# Patient Record
Sex: Male | Born: 1970 | Race: White | Hispanic: No | Marital: Married | Smoking: Former smoker
Health system: Southern US, Community
[De-identification: ages and names within clinical notes are randomized; demographics above are authoritative.]

## PROBLEM LIST (undated history)

## (undated) DIAGNOSIS — F419 Anxiety disorder, unspecified: Secondary | ICD-10-CM

## (undated) DIAGNOSIS — E559 Vitamin D deficiency, unspecified: Secondary | ICD-10-CM

## (undated) DIAGNOSIS — Z0282 Encounter for adoption services: Secondary | ICD-10-CM

## (undated) DIAGNOSIS — N471 Phimosis: Secondary | ICD-10-CM

## (undated) DIAGNOSIS — Z62819 Personal history of unspecified abuse in childhood: Secondary | ICD-10-CM

## (undated) DIAGNOSIS — K921 Melena: Secondary | ICD-10-CM

## (undated) DIAGNOSIS — E291 Testicular hypofunction: Secondary | ICD-10-CM

## (undated) DIAGNOSIS — I2510 Atherosclerotic heart disease of native coronary artery without angina pectoris: Secondary | ICD-10-CM

## (undated) DIAGNOSIS — G43909 Migraine, unspecified, not intractable, without status migrainosus: Secondary | ICD-10-CM

## (undated) DIAGNOSIS — T1490XA Injury, unspecified, initial encounter: Secondary | ICD-10-CM

## (undated) DIAGNOSIS — F32A Depression, unspecified: Secondary | ICD-10-CM

## (undated) DIAGNOSIS — K219 Gastro-esophageal reflux disease without esophagitis: Secondary | ICD-10-CM

## (undated) DIAGNOSIS — Z789 Other specified health status: Secondary | ICD-10-CM

## (undated) DIAGNOSIS — F319 Bipolar disorder, unspecified: Secondary | ICD-10-CM

## (undated) DIAGNOSIS — B029 Zoster without complications: Secondary | ICD-10-CM

## (undated) DIAGNOSIS — F1210 Cannabis abuse, uncomplicated: Secondary | ICD-10-CM

## (undated) DIAGNOSIS — B019 Varicella without complication: Secondary | ICD-10-CM

## (undated) DIAGNOSIS — Z8709 Personal history of other diseases of the respiratory system: Secondary | ICD-10-CM

## (undated) DIAGNOSIS — R399 Unspecified symptoms and signs involving the genitourinary system: Secondary | ICD-10-CM

## (undated) DIAGNOSIS — N419 Inflammatory disease of prostate, unspecified: Secondary | ICD-10-CM

## (undated) DIAGNOSIS — M1990 Unspecified osteoarthritis, unspecified site: Secondary | ICD-10-CM

## (undated) HISTORY — DX: Varicella without complication: B01.9

## (undated) HISTORY — DX: Gastro-esophageal reflux disease without esophagitis: K21.9

## (undated) HISTORY — PX: HERNIA REPAIR: SHX51

## (undated) HISTORY — DX: Migraine, unspecified, not intractable, without status migrainosus: G43.909

## (undated) HISTORY — PX: SHOULDER ARTHROSCOPY W/ ROTATOR CUFF REPAIR: SHX2400

## (undated) HISTORY — DX: Melena: K92.1

---

## 2008-12-16 ENCOUNTER — Emergency Department: Payer: Self-pay | Admitting: Emergency Medicine

## 2008-12-21 ENCOUNTER — Emergency Department: Payer: Self-pay | Admitting: Emergency Medicine

## 2010-06-08 ENCOUNTER — Emergency Department (HOSPITAL_COMMUNITY)
Admission: EM | Admit: 2010-06-08 | Discharge: 2010-06-08 | Disposition: A | Discharge status: Home / Self Care | Payer: Self-pay | Attending: Emergency Medicine | Admitting: Emergency Medicine

## 2010-06-08 ENCOUNTER — Emergency Department (HOSPITAL_COMMUNITY): Payer: Self-pay

## 2010-06-08 DIAGNOSIS — M7989 Other specified soft tissue disorders: Secondary | ICD-10-CM | POA: Insufficient documentation

## 2010-06-08 DIAGNOSIS — M25569 Pain in unspecified knee: Secondary | ICD-10-CM | POA: Insufficient documentation

## 2010-06-08 DIAGNOSIS — R296 Repeated falls: Secondary | ICD-10-CM | POA: Insufficient documentation

## 2010-06-08 DIAGNOSIS — IMO0002 Reserved for concepts with insufficient information to code with codable children: Secondary | ICD-10-CM | POA: Insufficient documentation

## 2010-07-11 ENCOUNTER — Emergency Department: Payer: Self-pay | Admitting: Emergency Medicine

## 2012-10-23 ENCOUNTER — Emergency Department: Payer: Self-pay | Admitting: Emergency Medicine

## 2013-02-23 ENCOUNTER — Emergency Department: Payer: Self-pay | Admitting: Emergency Medicine

## 2013-02-25 ENCOUNTER — Emergency Department: Payer: Self-pay | Admitting: Emergency Medicine

## 2013-04-28 ENCOUNTER — Emergency Department (HOSPITAL_BASED_OUTPATIENT_CLINIC_OR_DEPARTMENT_OTHER)
Admission: EM | Admit: 2013-04-28 | Discharge: 2013-04-28 | Disposition: A | Discharge status: Home / Self Care | Payer: Worker's Compensation | Attending: Emergency Medicine | Admitting: Emergency Medicine

## 2013-04-28 ENCOUNTER — Emergency Department (HOSPITAL_BASED_OUTPATIENT_CLINIC_OR_DEPARTMENT_OTHER): Payer: Worker's Compensation

## 2013-04-28 ENCOUNTER — Encounter (HOSPITAL_BASED_OUTPATIENT_CLINIC_OR_DEPARTMENT_OTHER): Payer: Self-pay | Admitting: Emergency Medicine

## 2013-04-28 DIAGNOSIS — Y9229 Other specified public building as the place of occurrence of the external cause: Secondary | ICD-10-CM | POA: Insufficient documentation

## 2013-04-28 DIAGNOSIS — S93409A Sprain of unspecified ligament of unspecified ankle, initial encounter: Secondary | ICD-10-CM | POA: Insufficient documentation

## 2013-04-28 DIAGNOSIS — F17200 Nicotine dependence, unspecified, uncomplicated: Secondary | ICD-10-CM | POA: Insufficient documentation

## 2013-04-28 DIAGNOSIS — X500XXA Overexertion from strenuous movement or load, initial encounter: Secondary | ICD-10-CM | POA: Insufficient documentation

## 2013-04-28 DIAGNOSIS — S93401A Sprain of unspecified ligament of right ankle, initial encounter: Secondary | ICD-10-CM

## 2013-04-28 DIAGNOSIS — Y9389 Activity, other specified: Secondary | ICD-10-CM | POA: Insufficient documentation

## 2013-04-28 DIAGNOSIS — Z791 Long term (current) use of non-steroidal anti-inflammatories (NSAID): Secondary | ICD-10-CM | POA: Insufficient documentation

## 2013-04-28 MED ORDER — HYDROCODONE-ACETAMINOPHEN 5-325 MG PO TABS: 2.0000 | ORAL_TABLET | ORAL | Status: DC | PRN

## 2013-04-28 MED ORDER — OXYCODONE-ACETAMINOPHEN 5-325 MG PO TABS
2.0000 | ORAL_TABLET | Freq: Once | ORAL | Status: AC
  Administered 2013-04-28: 2 via ORAL
  Filled 2013-04-28: qty 2

## 2013-04-28 MED ORDER — IBUPROFEN 800 MG PO TABS: 800.0000 mg | ORAL_TABLET | Freq: Three times a day (TID) | ORAL | Status: DC

## 2013-04-28 NOTE — ED Notes (Signed)
Stepped pff platform and twisted rt foot  Heard pop

## 2013-04-28 NOTE — ED Notes (Signed)
Pt c/o right ankle injury x 1 hr ago  

## 2013-04-28 NOTE — ED Provider Notes (Signed)
CSN: [id number]     Arrival date & time 04/28/13  1925 History  This chart was scribed for Glynn OctaveStephen Araminta Zorn, MD by Leone PayorSonum Patel, ED Scribe. This patient was seen in room MH05/MH05 and the patient's care was started 8:49 PM.      Chief Complaint  Patient presents with  . Ankle Pain    The history is provided by the patient. No language interpreter was used.    HPI Comments: Carlos Tapia is a 43 y.o. male who presents to the Emergency Department complaining of a right ankle injury that occurred about 2 hours ago. Pt states he was standing on a platform when he twisted his ankle. He denies falling or any other injuries. Pt states he has injured the same ankle when he was a child and has had occasional pain since then. He denies taking OTC pain medication for his symptoms. He denies numbness or weakness.   History reviewed. No pertinent past medical history. History reviewed. No pertinent past surgical history. History reviewed. No pertinent family history. History  Substance Use Topics  . Smoking status: Current Every Day Smoker -- 1.00 packs/day    Types: Cigarettes  . Smokeless tobacco: Not on file  . Alcohol Use: No    Review of Systems A complete 10 system review of systems was obtained and all systems are negative except as noted in the HPI and PMH.   Allergies  Review of patient's allergies indicates no known allergies.  Home Medications   Current Outpatient Rx  Name  Route  Sig  Dispense  Refill  . HYDROcodone-acetaminophen (NORCO/VICODIN) 5-325 MG per tablet   Oral   Take 2 tablets by mouth every 4 (four) hours as needed.   10 tablet   0   . ibuprofen (ADVIL,MOTRIN) 800 MG tablet   Oral   Take 1 tablet (800 mg total) by mouth 3 (three) times daily.   21 tablet   0    BP 156/98  Pulse 80  Temp(Src) 98.6 F (37 C) (Oral)  Resp 16  Ht 5\' 10"  (1.778 m)  Wt 280 lb (127.007 kg)  BMI 40.18 kg/m2  SpO2 99% Physical Exam  Nursing note and vitals  reviewed. Constitutional: He is oriented to person, place, and time. He appears well-developed and well-nourished.  HENT:  Head: Normocephalic and atraumatic.  Cardiovascular: Normal rate.   Pulmonary/Chest: Effort normal.  Abdominal: He exhibits no distension.  Musculoskeletal: He exhibits tenderness.       Right ankle: He exhibits swelling. He exhibits no deformity. Tenderness. Lateral malleolus tenderness found. No proximal fibula tenderness found. Achilles tendon normal. Achilles tendon exhibits normal Thompson's test results.  Swelling over right lateral malleolus. Achilles tendon intact. No proximal fibular tenderness.  +2 DP and PT pulses  Neurological: He is alert and oriented to person, place, and time.  Skin: Skin is warm and dry.  Psychiatric: He has a normal mood and affect.    ED Course  Procedures (including critical care time)  DIAGNOSTIC STUDIES: Oxygen Saturation is 99% on RA, normal by my interpretation.    COORDINATION OF CARE: 8:52 PM Discussed treatment plan with pt at bedside and pt agreed to plan.   Labs Review Labs Reviewed - No data to display Imaging Review Dg Ankle Complete Right  04/28/2013   CLINICAL DATA:  Fall, ankle injury, popping sensation  EXAM: RIGHT ANKLE - COMPLETE 3+ VIEW  COMPARISON:  None.  FINDINGS: Normal alignment without definite acute fracture. Right distal tibia, fibula, talus  and calcaneus appear intact. No significant soft tissue swelling.  IMPRESSION: No acute osseous finding   Electronically Signed   By: Ruel Favors M.D.   On: 04/28/2013 19:53    EKG Interpretation   None       MDM   1. Right ankle sprain    Twisted right ankle at work. Denies any other injury. Denies falling.  Swelling over right lateral malleolus. Intact distal pulses. Achilles tendon intact. No proximal fibula tenderness.  X-ray negative for fracture. We'll treat for ankle sprain with ASO, crutches, ice, pain medication.  I personally performed  the services described in this documentation, which was scribed in my presence. The recorded information has been reviewed and is accurate.   Glynn Octave, MD 04/28/13 2116

## 2013-04-28 NOTE — Discharge Instructions (Signed)

## 2013-11-18 ENCOUNTER — Ambulatory Visit: Payer: Self-pay | Admitting: Internal Medicine

## 2014-03-12 ENCOUNTER — Encounter (HOSPITAL_COMMUNITY): Payer: Self-pay | Admitting: Emergency Medicine

## 2014-03-12 ENCOUNTER — Emergency Department (HOSPITAL_COMMUNITY): Payer: 59

## 2014-03-12 ENCOUNTER — Emergency Department (HOSPITAL_COMMUNITY)
Admission: EM | Admit: 2014-03-12 | Discharge: 2014-03-12 | Disposition: A | Discharge status: Home / Self Care | Payer: 59 | Attending: Emergency Medicine | Admitting: Emergency Medicine

## 2014-03-12 DIAGNOSIS — Z720 Tobacco use: Secondary | ICD-10-CM | POA: Diagnosis not present

## 2014-03-12 DIAGNOSIS — M79601 Pain in right arm: Secondary | ICD-10-CM

## 2014-03-12 DIAGNOSIS — Y9389 Activity, other specified: Secondary | ICD-10-CM | POA: Diagnosis not present

## 2014-03-12 DIAGNOSIS — W19XXXA Unspecified fall, initial encounter: Secondary | ICD-10-CM

## 2014-03-12 DIAGNOSIS — S6991XA Unspecified injury of right wrist, hand and finger(s), initial encounter: Secondary | ICD-10-CM | POA: Diagnosis not present

## 2014-03-12 DIAGNOSIS — Y998 Other external cause status: Secondary | ICD-10-CM | POA: Diagnosis not present

## 2014-03-12 DIAGNOSIS — W1839XA Other fall on same level, initial encounter: Secondary | ICD-10-CM | POA: Insufficient documentation

## 2014-03-12 DIAGNOSIS — T1490XA Injury, unspecified, initial encounter: Secondary | ICD-10-CM

## 2014-03-12 DIAGNOSIS — Y9289 Other specified places as the place of occurrence of the external cause: Secondary | ICD-10-CM | POA: Insufficient documentation

## 2014-03-12 DIAGNOSIS — S4991XA Unspecified injury of right shoulder and upper arm, initial encounter: Secondary | ICD-10-CM | POA: Diagnosis present

## 2014-03-12 MED ORDER — NAPROXEN 500 MG PO TABS: 500.0000 mg | ORAL_TABLET | Freq: Two times a day (BID) | ORAL | Status: DC

## 2014-03-12 MED ORDER — HYDROCODONE-ACETAMINOPHEN 5-325 MG PO TABS: 2.0000 | ORAL_TABLET | ORAL | Status: DC | PRN

## 2014-03-12 MED ORDER — NAPROXEN 500 MG PO TABS
500.0000 mg | ORAL_TABLET | Freq: Once | ORAL | Status: AC
  Administered 2014-03-12: 500 mg via ORAL
  Filled 2014-03-12: qty 1

## 2014-03-12 MED ORDER — HYDROCODONE-ACETAMINOPHEN 5-325 MG PO TABS
2.0000 | ORAL_TABLET | Freq: Once | ORAL | Status: AC
  Administered 2014-03-12: 2 via ORAL
  Filled 2014-03-12: qty 2

## 2014-03-12 NOTE — ED Notes (Signed)
Pt states he tripped and fell backwards yesterday, bent his right hand backwards while catching his fall, mild pain to right wrist, more severe pain noted in right shoulder after fall while trying to sleep.

## 2014-03-12 NOTE — Discharge Instructions (Signed)
Please follow directions provided. It does not look like your hand wrist or shoulder any fractures. You can where your sling for comfort. May take the naproxen twice a day for pain and inflammation, and the Vicodin as needed for worsening pain. Be sure to follow-up with the orthopedic referral. Don't hesitate to return for any new, worsening, or concerning symptoms.   SEEK IMMEDIATE MEDICAL CARE IF:  Your arm, hand, or fingers are numb or tingling.  Your arm, hand, or fingers are significantly swollen or turn white or blue.

## 2014-03-12 NOTE — ED Provider Notes (Signed)
CSN: [id number]     Arrival date & time 03/12/14  1244 History  This chart was scribed for Donetta PottsBeth Kairon Shock, [facility], working with Elwin MochaBlair Walden, MD by Jolene Provostobert Halas, ED Scribe. This patient was seen in room WTR9/WTR9 and the patient's care was started at 1:53 PM.     Chief Complaint  Patient presents with  . Shoulder Injury     (Consider location/radiation/quality/duration/timing/severity/associated sxs/prior Treatment) HPI  HPI Comments: Carlos Tapia is a 43 y.o. male who presents to the Emergency Department complaining of right shoulder pain radiating from wrist after a fall yesterday evening. Pt states he was shuffling backwards and tripped and caught himself behind his back with his right hand. Pt states the pain in his shoulder woke him up last night. Pt endorses radiating wrist and shoulder pain, limited ROM, swelling, and decreased strength. Pt denies thumb pain.   History reviewed. No pertinent past medical history. History reviewed. No pertinent past surgical history. History reviewed. No pertinent family history. History  Substance Use Topics  . Smoking status: Current Every Day Smoker -- 1.00 packs/day    Types: Cigarettes  . Smokeless tobacco: Not on file  . Alcohol Use: No    Review of Systems  Respiratory: Negative for shortness of breath.   Cardiovascular: Negative for chest pain.  Musculoskeletal: Positive for myalgias and arthralgias. Negative for back pain and neck pain.  Neurological: Negative for numbness.     Allergies  Review of patient's allergies indicates no known allergies.  Home Medications   Prior to Admission medications   Medication Sig Start Date End Date Taking? Authorizing Provider  HYDROcodone-acetaminophen (NORCO/VICODIN) 5-325 MG per tablet Take 2 tablets by mouth every 4 (four) hours as needed. 04/28/13   Glynn OctaveStephen Rancour, MD  ibuprofen (ADVIL,MOTRIN) 800 MG tablet Take 1 tablet (800 mg total) by mouth 3 (three) times daily. 04/28/13   Glynn OctaveStephen  Rancour, MD   BP 142/81 mmHg  Pulse 79  Temp(Src) 98.2 F (36.8 C) (Oral)  Resp 16  SpO2 98% Physical Exam  Constitutional: He is oriented to person, place, and time. He appears well-developed and well-nourished.  HENT:  Head: Normocephalic and atraumatic.  Eyes: Pupils are equal, round, and reactive to light.  Neck: Neck supple.  Cardiovascular: Normal rate and regular rhythm.   Pulmonary/Chest: Effort normal and breath sounds normal. No respiratory distress.  Musculoskeletal:  Decreased ROM in thumb. 5/5 strength with flexion and extension in all fingers. 2+ radial pulses. Neurovascularly intact. TTP right lateral wrist and decreased ROM in arm due to pain. ROM up to 30 degrees in right arm with flexion and abduction. Decreased ROM with extension. No deformity or crepitous noted in right shoulder.   Neurological: He is alert and oriented to person, place, and time.  Skin: Skin is warm and dry.  Psychiatric: He has a normal mood and affect. His behavior is normal.  Nursing note and vitals reviewed.   ED Course  Procedures  DIAGNOSTIC STUDIES: Oxygen Saturation is 98% on RA, normal by my interpretation.    COORDINATION OF CARE: 1:53 PM Discussed treatment plan with pt at bedside and pt agreed to plan.  Labs Review Labs Reviewed - No data to display  Imaging Review Dg Shoulder Right  03/12/2014   CLINICAL DATA:  43 year old male with right shoulder pain after fall backwards last night. Initial encounter.  EXAM: RIGHT SHOULDER - 2+ VIEW  COMPARISON:  None.  FINDINGS: Bone mineralization is within normal limits. No glenohumeral joint dislocation. Proximal right  humerus intact. Right scapula intact. Right clavicle and visualized ribs intact. Negative visualized right lung parenchyma.  IMPRESSION: No acute fracture or dislocation identified about the right shoulder.   Electronically Signed   By: Augusto GambleLee  Hall M.D.   On: 03/12/2014 14:25   Dg Wrist Complete Right  03/12/2014    CLINICAL DATA:  Acute right wrist pain after fall.  EXAM: RIGHT WRIST - COMPLETE 3+ VIEW  COMPARISON:  None.  FINDINGS: There is no evidence of fracture or dislocation. There is no evidence of arthropathy or other focal bone abnormality. Soft tissues are unremarkable.  IMPRESSION: Normal right wrist.   Electronically Signed   By: Roque LiasJames  Green M.D.   On: 03/12/2014 14:28   Dg Hand Complete Right  03/12/2014   CLINICAL DATA:  Right hand pain after fall.  EXAM: RIGHT HAND - COMPLETE 3+ VIEW  COMPARISON:  None.  FINDINGS: There is no evidence of fracture or dislocation. There is no evidence of arthropathy or other focal bone abnormality. Soft tissues are unremarkable.  IMPRESSION: Normal right hand.   Electronically Signed   By: Roque LiasJames  Green M.D.   On: 03/12/2014 14:31     EKG Interpretation None      MDM   Final diagnoses:  Injury  Fall, initial encounter  Pain of right upper extremity   43 yo with rt shoulder, wrist and hand pain after fall.  His X-Ray is negative for obvious fracture or dislocation. Pain managed in ED. Referral provided to follow up with orthopedics if symptoms persist. Sling provided in ED, conservative therapy recommended and discussed. Patient will be dc home & is agreeable with above plan.   Filed Vitals:   03/12/14 1252 03/12/14 1523  BP: 142/81 137/92  Pulse: 79 80  Temp: 98.2 F (36.8 C) 98.6 F (37 C)  TempSrc: Oral Oral  Resp: 16 18  SpO2: 98% 99%   Meds given in ED:  Medications  HYDROcodone-acetaminophen (NORCO/VICODIN) 5-325 MG per tablet 2 tablet (2 tablets Oral Given 03/12/14 1418)  naproxen (NAPROSYN) tablet 500 mg (500 mg Oral Given 03/12/14 1418)    Discharge Medication List as of 03/12/2014  2:55 PM    START taking these medications   Details  naproxen (NAPROSYN) 500 MG tablet Take 1 tablet (500 mg total) by mouth 2 (two) times daily with a meal., Starting 03/12/2014, Until Discontinued, Print          Harle BattiestElizabeth Keyaira Clapham,  NP 03/13/14 1723  Elwin MochaBlair Walden, MD 03/14/14 0025

## 2014-08-12 ENCOUNTER — Emergency Department (HOSPITAL_COMMUNITY): Payer: 59

## 2014-08-12 ENCOUNTER — Encounter (HOSPITAL_COMMUNITY): Payer: Self-pay

## 2014-08-12 ENCOUNTER — Emergency Department (HOSPITAL_COMMUNITY)
Admission: EM | Admit: 2014-08-12 | Discharge: 2014-08-12 | Disposition: A | Discharge status: Home / Self Care | Payer: 59 | Attending: Emergency Medicine | Admitting: Emergency Medicine

## 2014-08-12 DIAGNOSIS — Y9289 Other specified places as the place of occurrence of the external cause: Secondary | ICD-10-CM | POA: Diagnosis not present

## 2014-08-12 DIAGNOSIS — X58XXXA Exposure to other specified factors, initial encounter: Secondary | ICD-10-CM | POA: Insufficient documentation

## 2014-08-12 DIAGNOSIS — Y9389 Activity, other specified: Secondary | ICD-10-CM | POA: Insufficient documentation

## 2014-08-12 DIAGNOSIS — S99912A Unspecified injury of left ankle, initial encounter: Secondary | ICD-10-CM | POA: Diagnosis present

## 2014-08-12 DIAGNOSIS — Z720 Tobacco use: Secondary | ICD-10-CM | POA: Insufficient documentation

## 2014-08-12 DIAGNOSIS — S93402A Sprain of unspecified ligament of left ankle, initial encounter: Secondary | ICD-10-CM | POA: Insufficient documentation

## 2014-08-12 DIAGNOSIS — Y998 Other external cause status: Secondary | ICD-10-CM | POA: Diagnosis not present

## 2014-08-12 DIAGNOSIS — Z791 Long term (current) use of non-steroidal anti-inflammatories (NSAID): Secondary | ICD-10-CM | POA: Diagnosis not present

## 2014-08-12 MED ORDER — OXYCODONE-ACETAMINOPHEN 5-325 MG PO TABS
1.0000 | ORAL_TABLET | ORAL | Status: DC | PRN
Start: 2014-08-12 — End: 2015-02-26

## 2014-08-12 NOTE — Discharge Instructions (Signed)

## 2014-08-12 NOTE — ED Provider Notes (Signed)
CSN: [id number]     Arrival date & time 08/12/14  0457 History   First MD Initiated Contact with Patient 08/12/14 0502     Chief Complaint  Patient presents with  . Ankle Pain     (Consider location/radiation/quality/duration/timing/severity/associated sxs/prior Treatment) Patient is a 44 y.o. male presenting with leg pain.  Leg Pain Location:  Ankle Time since incident:  1 hour Injury: yes   Mechanism of injury comment:  Stepped in an open drain Ankle location:  L ankle Pain details:    Quality:  Aching   Radiates to:  Does not radiate   Severity:  Severe   Onset quality:  Sudden   Timing:  Constant   Progression:  Unchanged Prior injury to area:  No Relieved by:  Nothing Worsened by:  Bearing weight, flexion and extension Associated symptoms: swelling   Associated symptoms: no back pain, no muscle weakness, no numbness and no tingling     History reviewed. No pertinent past medical history. History reviewed. No pertinent past surgical history. History reviewed. No pertinent family history. History  Substance Use Topics  . Smoking status: Current Every Day Smoker -- 1.00 packs/day    Types: Cigarettes  . Smokeless tobacco: Not on file  . Alcohol Use: No    Review of Systems  Musculoskeletal: Negative for back pain.  All other systems reviewed and are negative.     Allergies  Review of patient's allergies indicates no known allergies.  Home Medications   Prior to Admission medications   Medication Sig Start Date End Date Taking? Authorizing Provider  ibuprofen (ADVIL,MOTRIN) 800 MG tablet Take 1 tablet (800 mg total) by mouth 3 (three) times daily. 04/28/13   Glynn OctaveStephen Rancour, MD  naproxen (NAPROSYN) 500 MG tablet Take 1 tablet (500 mg total) by mouth 2 (two) times daily with a meal. 03/12/14   Harle BattiestElizabeth Tysinger, NP  oxyCODONE-acetaminophen (PERCOCET/ROXICET) 5-325 MG per tablet Take 1-2 tablets by mouth every 4 (four) hours as needed for severe pain. 08/12/14    Mirian MoMatthew Gentry, MD   BP 155/96 mmHg  Pulse 73  Temp(Src) 97.8 F (36.6 C) (Oral)  Resp 18  Ht 5\' 10"  (1.778 m)  Wt 310 lb (140.615 kg)  BMI 44.48 kg/m2  SpO2 98% Physical Exam  Constitutional: He is oriented to person, place, and time. He appears well-developed and well-nourished.  HENT:  Head: Normocephalic and atraumatic.  Eyes: Conjunctivae and EOM are normal.  Neck: Normal range of motion. Neck supple.  Cardiovascular: Normal rate, regular rhythm and normal heart sounds.   Pulmonary/Chest: Effort normal and breath sounds normal. No respiratory distress.  Abdominal: He exhibits no distension. There is no tenderness. There is no rebound and no guarding.  Musculoskeletal:       Left ankle: He exhibits decreased range of motion (2/2 pain) and swelling. He exhibits no deformity, no laceration and normal pulse. Tenderness. Lateral malleolus tenderness found.  Neurological: He is alert and oriented to person, place, and time.  Skin: Skin is warm and dry.  Vitals reviewed.   ED Course  Procedures (including critical care time) Labs Review Labs Reviewed - No data to display  Imaging Review Dg Ankle Complete Left  08/12/2014   CLINICAL DATA:  Patient stepped in a hole this morning, twisting the left ankle. Patient heard a pop. Lateral pain and soft tissue swelling.  EXAM: LEFT ANKLE COMPLETE - 3+ VIEW  COMPARISON:  None.  FINDINGS: Mild lateral soft tissue swelling about the left ankle. Small plantar  and Achilles calcaneal spurs. No evidence of acute fracture or subluxation. No focal bone lesion or bone destruction. Bone cortex and trabecular architecture appear intact. No radiopaque soft tissue foreign bodies.  IMPRESSION: Lateral soft tissue swelling.  No acute fracture or dislocation.   Electronically Signed   By: Burman NievesWilliam  Stevens M.D.   On: 08/12/2014 05:19     EKG Interpretation None      MDM   Final diagnoses:  Ankle sprain, left, initial encounter    44 y.o. male  without pertinent PMH presents with left ankle pain as above. On arrival vital signs and physical exam as above. Patient has limited range of motion secondary to pain. Radiography unremarkable. Likely high-grade sprain. Patient placed in splint, given crutches, will follow-up with orthopedics. Small amount of pain medication written.    I have reviewed all laboratory and imaging studies if ordered as above  1. Ankle sprain, left, initial encounter         Mirian MoMatthew Gentry, MD 08/12/14 [phone]

## 2014-08-12 NOTE — ED Notes (Signed)
Pt stepped in a pot hole this am while leaving to go to work and he heard his left ankle pop. Ankle is swollen and patient can't move it.

## 2014-08-15 ENCOUNTER — Emergency Department (HOSPITAL_COMMUNITY)
Admission: EM | Admit: 2014-08-15 | Discharge: 2014-08-15 | Disposition: A | Discharge status: Home / Self Care | Payer: 59 | Attending: Emergency Medicine | Admitting: Emergency Medicine

## 2014-08-15 ENCOUNTER — Emergency Department (HOSPITAL_COMMUNITY): Payer: 59

## 2014-08-15 ENCOUNTER — Encounter (HOSPITAL_COMMUNITY): Payer: Self-pay | Admitting: Emergency Medicine

## 2014-08-15 DIAGNOSIS — Y9389 Activity, other specified: Secondary | ICD-10-CM | POA: Insufficient documentation

## 2014-08-15 DIAGNOSIS — Y998 Other external cause status: Secondary | ICD-10-CM | POA: Insufficient documentation

## 2014-08-15 DIAGNOSIS — W1842XA Slipping, tripping and stumbling without falling due to stepping into hole or opening, initial encounter: Secondary | ICD-10-CM | POA: Insufficient documentation

## 2014-08-15 DIAGNOSIS — S93402A Sprain of unspecified ligament of left ankle, initial encounter: Secondary | ICD-10-CM | POA: Insufficient documentation

## 2014-08-15 DIAGNOSIS — Z720 Tobacco use: Secondary | ICD-10-CM | POA: Insufficient documentation

## 2014-08-15 DIAGNOSIS — S99912A Unspecified injury of left ankle, initial encounter: Secondary | ICD-10-CM | POA: Diagnosis present

## 2014-08-15 DIAGNOSIS — Y9289 Other specified places as the place of occurrence of the external cause: Secondary | ICD-10-CM | POA: Diagnosis not present

## 2014-08-15 NOTE — ED Provider Notes (Signed)
CSN: [id number]     Arrival date & time 08/15/14  1225 History  This chart was scribed for non-physician practitioner working, Teressa LowerVrinda Gustavo Dispenza, NP, with Linwood DibblesJon Knapp, MD, by Modena JanskyAlbert Thayil, ED Scribe. This patient was seen in room WTR5/WTR5 and the patient's care was started at 1:40 PM.   Chief Complaint  Patient presents with  . Ankle Injury   The history is provided by the patient. No language interpreter was used.   HPI Comments: Carlos Tapia is a 44 y.o. male who presents to the Emergency Department complaining of a left ankle injury that occurred 3 days. He reports that he was seen here recently for a left ankle injury when he stepped in an open pipe and dx with a left ankle sprain after x-ray 3 days ago. He states that he he was advised to come to the ED if his symptoms worsen. He states that he has been having worsening constant moderate left ankle pain with swelling since then.  He reports that moving his left ankle and bearing weight exacerbates the pain.    History reviewed. No pertinent past medical history. History reviewed. No pertinent past surgical history. History reviewed. No pertinent family history. History  Substance Use Topics  . Smoking status: Current Every Day Smoker -- 1.00 packs/day    Types: Cigarettes  . Smokeless tobacco: Not on file  . Alcohol Use: No    Review of Systems  Musculoskeletal: Positive for myalgias, joint swelling and arthralgias.  All other systems reviewed and are negative.  Allergies  Review of patient's allergies indicates no known allergies.  Home Medications   Prior to Admission medications   Medication Sig Start Date End Date Taking? Authorizing Provider  ibuprofen (ADVIL,MOTRIN) 800 MG tablet Take 1 tablet (800 mg total) by mouth 3 (three) times daily. Patient not taking: Reported on 08/12/2014 04/28/13   Glynn OctaveStephen Rancour, MD  naproxen (NAPROSYN) 500 MG tablet Take 1 tablet (500 mg total) by mouth 2 (two) times daily with a  meal. Patient not taking: Reported on 08/12/2014 03/12/14   Harle BattiestElizabeth Tysinger, NP  oxyCODONE-acetaminophen (PERCOCET/ROXICET) 5-325 MG per tablet Take 1-2 tablets by mouth every 4 (four) hours as needed for severe pain. 08/12/14   Mirian MoMatthew Gentry, MD   BP 146/100 mmHg  Pulse 66  Temp(Src) 97.5 F (36.4 C) (Oral)  Resp 16  SpO2 99% Physical Exam  Constitutional: He is oriented to person, place, and time. He appears well-developed and well-nourished. No distress.  HENT:  Head: Normocephalic and atraumatic.  Neck: Neck supple. No tracheal deviation present.  Cardiovascular: Normal rate.   Pulmonary/Chest: Effort normal. No respiratory distress.  Musculoskeletal: Normal range of motion.  Swelling noted to the left foot ankle ankle. Pulses intact.   Neurological: He is alert and oriented to person, place, and time.  Skin: Skin is warm and dry.  Psychiatric: He has a normal mood and affect. His behavior is normal.  Nursing note and vitals reviewed.   ED Course  Procedures (including critical care time) DIAGNOSTIC STUDIES: Oxygen Saturation is 99% on RA, normal by my interpretation.    COORDINATION OF CARE: 1:44 PM- Pt advised of plan for treatment which includes radiology and pt agrees.  Labs Review Labs Reviewed - No data to display  Imaging Review Dg Tibia/fibula Left  08/15/2014   CLINICAL DATA:  Acute left ankle pain and swelling after stepping in a hole 3 days ago.  EXAM: LEFT TIBIA AND FIBULA - 2 VIEW  COMPARISON:  None.  FINDINGS: There is no evidence of fracture or other focal bone lesions. Soft tissues are unremarkable.  IMPRESSION: Normal left tibia and fibula.   Electronically Signed   By: Lupita RaiderJames  Green Jr, M.D.   On: 08/15/2014 13:51   Dg Ankle Complete Left  08/15/2014   CLINICAL DATA:  Left ankle injury  EXAM: LEFT ANKLE COMPLETE - 3+ VIEW  COMPARISON:  None.  FINDINGS: Spurring at the posterior and inferior calcaneus. No acute fracture. No dislocation. Soft tissue  swelling about the ankle joint is noted.  IMPRESSION: No acute bony pathology.   Electronically Signed   By: Jolaine ClickArthur  Hoss M.D.   On: 08/15/2014 13:49   Dg Foot Complete Left  08/15/2014   CLINICAL DATA:  Injury 3 days ago  EXAM: LEFT FOOT - COMPLETE 3+ VIEW  COMPARISON:  None.  FINDINGS: There is spurring at the posterior and inferior calcaneus. The lateral sesamoid is fragmented with a chronic appearance. No acute fracture or dislocation.  IMPRESSION: No acute bony pathology.   Electronically Signed   By: Jolaine ClickArthur  Hoss M.D.   On: 08/15/2014 13:47     EKG Interpretation None      MDM   Final diagnoses:  Ankle sprain, left, initial encounter    Likely a high grade sprain. Discussed importance of crutches and elevation:pt placed in an ace wrap here  I personally performed the services described in this documentation, which was scribed in my presence. The recorded information has been reviewed and is accurate.     Teressa LowerVrinda Neola Worrall, NP 08/15/14 1409  Linwood DibblesJon Knapp, MD 08/16/14 1537

## 2014-08-15 NOTE — Discharge Instructions (Signed)
Follow up with the orthopedist that they gave you the other day. Ankle Sprain An ankle sprain is an injury to the strong, fibrous tissues (ligaments) that hold the bones of your ankle joint together.  CAUSES An ankle sprain is usually caused by a fall or by twisting your ankle. Ankle sprains most commonly occur when you step on the outer edge of your foot, and your ankle turns inward. People who participate in sports are more prone to these types of injuries.  SYMPTOMS   Pain in your ankle. The pain may be present at rest or only when you are trying to stand or walk.  Swelling.  Bruising. Bruising may develop immediately or within 1 to 2 days after your injury.  Difficulty standing or walking, particularly when turning corners or changing directions. DIAGNOSIS  Your caregiver will ask you details about your injury and perform a physical exam of your ankle to determine if you have an ankle sprain. During the physical exam, your caregiver will press on and apply pressure to specific areas of your foot and ankle. Your caregiver will try to move your ankle in certain ways. An X-ray exam may be done to be sure a bone was not broken or a ligament did not separate from one of the bones in your ankle (avulsion fracture).  TREATMENT  Certain types of braces can help stabilize your ankle. Your caregiver can make a recommendation for this. Your caregiver may recommend the use of medicine for pain. If your sprain is severe, your caregiver may refer you to a surgeon who helps to restore function to parts of your skeletal system (orthopedist) or a physical therapist. HOME CARE INSTRUCTIONS   Apply ice to your injury for 1-2 days or as directed by your caregiver. Applying ice helps to reduce inflammation and pain.  Put ice in a plastic bag.  Place a towel between your skin and the bag.  Leave the ice on for 15-20 minutes at a time, every 2 hours while you are awake.  Only take over-the-counter or  prescription medicines for pain, discomfort, or fever as directed by your caregiver.  Elevate your injured ankle above the level of your heart as much as possible for 2-3 days.  If your caregiver recommends crutches, use them as instructed. Gradually put weight on the affected ankle. Continue to use crutches or a cane until you can walk without feeling pain in your ankle.  If you have a plaster splint, wear the splint as directed by your caregiver. Do not rest it on anything harder than a pillow for the first 24 hours. Do not put weight on it. Do not get it wet. You may take it off to take a shower or bath.  You may have been given an elastic bandage to wear around your ankle to provide support. If the elastic bandage is too tight (you have numbness or tingling in your foot or your foot becomes cold and blue), adjust the bandage to make it comfortable.  If you have an air splint, you may blow more air into it or let air out to make it more comfortable. You may take your splint off at night and before taking a shower or bath. Wiggle your toes in the splint several times per day to decrease swelling. SEEK MEDICAL CARE IF:   You have rapidly increasing bruising or swelling.  Your toes feel extremely cold or you lose feeling in your foot.  Your pain is not relieved with medicine.  SEEK IMMEDIATE MEDICAL CARE IF:  Your toes are numb or blue.  You have severe pain that is increasing. MAKE SURE YOU:   Understand these instructions.  Will watch your condition.  Will get help right away if you are not doing well or get worse. Document Released: 03/20/2005 Document Revised: 12/13/2011 Document Reviewed: 04/01/2011 [facility] CenterExitCare Patient Information 2015 [place]ExitCare, [place]. This information is not intended to replace advice given to you by your health care provider. Make sure you discuss any questions you have with your health care provider.

## 2014-08-15 NOTE — ED Notes (Signed)
Pt was seen here recently for left ankle injury (stepped in a hole). Had x-rays and was told it was an ankle sprain. Now is having left ankle/foot/leg swelling. Limited ROM with left foot and ankle. Ambulatory with a limp. No other c/c.

## 2014-08-19 ENCOUNTER — Encounter: Payer: Self-pay | Admitting: Family

## 2014-08-19 ENCOUNTER — Ambulatory Visit (INDEPENDENT_AMBULATORY_CARE_PROVIDER_SITE_OTHER): Payer: 59 | Admitting: Family

## 2014-08-19 ENCOUNTER — Other Ambulatory Visit (INDEPENDENT_AMBULATORY_CARE_PROVIDER_SITE_OTHER): Payer: 59

## 2014-08-19 VITALS — BP 140/94 | HR 61 | Temp 98.1°F | Resp 18 | Ht 70.0 in | Wt 305.0 lb

## 2014-08-19 DIAGNOSIS — R5383 Other fatigue: Secondary | ICD-10-CM

## 2014-08-19 DIAGNOSIS — R030 Elevated blood-pressure reading, without diagnosis of hypertension: Secondary | ICD-10-CM | POA: Insufficient documentation

## 2014-08-19 DIAGNOSIS — G4700 Insomnia, unspecified: Secondary | ICD-10-CM | POA: Diagnosis not present

## 2014-08-19 DIAGNOSIS — I10 Essential (primary) hypertension: Secondary | ICD-10-CM

## 2014-08-19 DIAGNOSIS — IMO0001 Reserved for inherently not codable concepts without codable children: Secondary | ICD-10-CM

## 2014-08-19 LAB — VITAMIN B12: VITAMIN B 12: 663 pg/mL (ref 211–911)

## 2014-08-19 LAB — TSH: TSH: 1.95 u[IU]/mL (ref 0.35–4.50)

## 2014-08-19 MED ORDER — LORCASERIN HCL 10 MG PO TABS
10.0000 mg | ORAL_TABLET | Freq: Two times a day (BID) | ORAL | Status: DC
Start: 2014-08-19 — End: 2014-08-19

## 2014-08-19 MED ORDER — TEMAZEPAM 15 MG PO CAPS: 15.0000 mg | ORAL_CAPSULE | Freq: Every evening | ORAL | Status: DC | PRN

## 2014-08-19 MED ORDER — LORCASERIN HCL 10 MG PO TABS: 10.0000 mg | ORAL_TABLET | Freq: Two times a day (BID) | ORAL | Status: DC

## 2014-08-19 NOTE — Assessment & Plan Note (Signed)
Insomnia most likely secondary to anxiety. Discussed importance of sleep hygiene with information provided in the after visit summary. Discussed options for treatment with risks and benefits. Start temazepam as needed for sleep. Follow-up 1 month to determine effectiveness

## 2014-08-19 NOTE — Progress Notes (Signed)
Pre visit review using our clinic review tool, if applicable. No additional management support is needed unless otherwise documented below in the visit note. 

## 2014-08-19 NOTE — Patient Instructions (Signed)
Thank you for choosing [facility].  Summary/Instructions:  Your prescription(s) have been submitted to your pharmacy or been printed and provided for you. Please take as directed and contact our office if you believe you are having problem(s) with the medication(s) or have any questions.  Please stop by the lab on the basement level of the building for your blood work. Your results will be released to MyChart (or called to you) after review, usually within 72 hours after test completion. If any changes need to be made, you will be notified at that same time.  If your symptoms worsen or fail to improve, please contact our office for further instruction, or in case of emergency go directly to the emergency room at the closest medical facility.    Recommendations for improving sleep:   Avoid having pets sleep in the bedroom  Avoid caffeine consumption after 4pm  Keep bedroom cool and conducive to sleep  Avoid nicotine use, especially in the evening  Avoid exercise within 2-3 hours before bedtime  Stimulus Control:   Go to bed only when sleepy  Use the bedroom for sleep and sex only  Go to another room if you are unable to fall asleep within 15 to 20 minutes  Read or engage in other quiet activities and return to bed only when sleepy.  Insomnia Insomnia is frequent trouble falling and/or staying asleep. Insomnia can be a long term problem or a short term problem. Both are common. Insomnia can be a short term problem when the wakefulness is related to a certain stress or worry. Long term insomnia is often related to ongoing stress during waking hours and/or poor sleeping habits. Overtime, sleep deprivation itself can make the problem worse. Every little thing feels more severe because you are overtired and your ability to cope is decreased. CAUSES   Stress, anxiety, and depression.  Poor sleeping habits.  Distractions such as TV in the bedroom.  Naps close to  bedtime.  Engaging in emotionally charged conversations before bed.  Technical reading before sleep.  Alcohol and other sedatives. They may make the problem worse. They can hurt normal sleep patterns and normal dream activity.  Stimulants such as caffeine for several hours prior to bedtime.  Pain syndromes and shortness of breath can cause insomnia.  Exercise late at night.  Changing time zones may cause sleeping problems (jet lag). It is sometimes helpful to have someone observe your sleeping patterns. They should look for periods of not breathing during the night (sleep apnea). They should also look to see how long those periods last. If you live alone or observers are uncertain, you can also be observed at a sleep clinic where your sleep patterns will be professionally monitored. Sleep apnea requires a checkup and treatment. Give your caregivers your medical history. Give your caregivers observations your family has made about your sleep.  SYMPTOMS   Not feeling rested in the morning.  Anxiety and restlessness at bedtime.  Difficulty falling and staying asleep. TREATMENT   Your caregiver may prescribe treatment for an underlying medical disorders. Your caregiver can give advice or help if you are using alcohol or other drugs for self-medication. Treatment of underlying problems will usually eliminate insomnia problems.  Medications can be prescribed for short time use. They are generally not recommended for lengthy use.  Over-the-counter sleep medicines are not recommended for lengthy use. They can be habit forming.  You can promote easier sleeping by making lifestyle changes such as:  Using relaxation  techniques that help with breathing and reduce muscle tension.  Exercising earlier in the day.  Changing your diet and the time of your last meal. No night time snacks.  Establish a regular time to go to bed.  Counseling can help with stressful problems and  worry.  Soothing music and white noise may be helpful if there are background noises you cannot remove.  Stop tedious detailed work at least one hour before bedtime. HOME CARE INSTRUCTIONS   Keep a diary. Inform your caregiver about your progress. This includes any medication side effects. See your caregiver regularly. Take note of:  Times when you are asleep.  Times when you are awake during the night.  The quality of your sleep.  How you feel the next day. This information will help your caregiver care for you.  Get out of bed if you are still awake after 15 minutes. Read or do some quiet activity. Keep the lights down. Wait until you feel sleepy and go back to bed.  Keep regular sleeping and waking hours. Avoid naps.  Exercise regularly.  Avoid distractions at bedtime. Distractions include watching television or engaging in any intense or detailed activity like attempting to balance the household checkbook.  Develop a bedtime ritual. Keep a familiar routine of bathing, brushing your teeth, climbing into bed at the same time each night, listening to soothing music. Routines increase the success of falling to sleep faster.  Use relaxation techniques. This can be using breathing and muscle tension release routines. It can also include visualizing peaceful scenes. You can also help control troubling or intruding thoughts by keeping your mind occupied with boring or repetitive thoughts like the old concept of counting sheep. You can make it more creative like imagining planting one beautiful flower after another in your backyard garden.  During your day, work to eliminate stress. When this is not possible use some of the previous suggestions to help reduce the anxiety that accompanies stressful situations. MAKE SURE YOU:   Understand these instructions.  Will watch your condition.  Will get help right away if you are not doing well or get worse. Document Released: 03/17/2000  Document Revised: 06/12/2011 Document Reviewed: 04/17/2007 [facility]Exit[facility] Patient Information 2015 [place]ExitCare, [place]LLC. This information is not intended to replace advice given to you by your health care provider. Make sure you discuss any questions you have with your health care provider.  Exercise to Lose Weight Exercise and a healthy diet may help you lose weight. Your doctor may suggest specific exercises. EXERCISE IDEAS AND TIPS  Choose low-cost things you enjoy doing, such as walking, bicycling, or exercising to workout videos.  Take stairs instead of the elevator.  Walk during your lunch break.  Park your car further away from work or school.  Go to a gym or an exercise class.  Start with 5 to 10 minutes of exercise each day. Build up to 30 minutes of exercise 4 to 6 days a week.  Wear shoes with good support and comfortable clothes.  Stretch before and after working out.  Work out until you breathe harder and your heart beats faster.  Drink extra water when you exercise.  Do not do so much that you hurt yourself, feel dizzy, or get very short of breath. Exercises that burn about 150 calories:  Running 1  miles in 15 minutes.  Playing volleyball for 45 to 60 minutes.  Washing and waxing a car for 45 to 60 minutes.  Playing touch football for  45 minutes.  Walking 1  miles in 35 minutes.  Pushing a stroller 1  miles in 30 minutes.  Playing basketball for 30 minutes.  Raking leaves for 30 minutes.  Bicycling 5 miles in 30 minutes.  Walking 2 miles in 30 minutes.  Dancing for 30 minutes.  Shoveling snow for 15 minutes.  Swimming laps for 20 minutes.  Walking up stairs for 15 minutes.  Bicycling 4 miles in 15 minutes.  Gardening for 30 to 45 minutes.  Jumping rope for 15 minutes.  Washing windows or floors for 45 to 60 minutes. Document Released: 04/22/2010 Document Revised: 06/12/2011 Document Reviewed: 04/22/2010 [facility] Patient Information 2015  [place]ExitCare, [place]LL[place]. This information is not intended to replace advice given to you by your health care provider. Make sure you discuss any questions you have with your health care provider.

## 2014-08-19 NOTE — Assessment & Plan Note (Addendum)
Symptoms of fatigue of multifactorial origin including insomnia and obesity. Cannot rule out anxiety/depression, cardiovascular disease or testosterone. Obtain B-12 and thyroid to rule out metabolic causes. Follow-up pending lab work. Start Belviq to start weight loss with lifestyle changes to help decrease fatigue.

## 2014-08-19 NOTE — Progress Notes (Signed)
Subjective:    Patient ID: Carlos Tapia, male    DOB: 04/18/1970, 44 y.o.   MRN: [id number]  Chief Complaint  Patient presents with  . Establish Care    has bad insomnia, can not sleep more than 4 hours a night, having issues with joints in legs think its weight related, wants to see about weight loss medication    HPI:  Carlos KohutMichael A Hanneman is a 44 y.o. male with a PMH of GERD and migraines who presents today for an office visit to establish care.   1) Insomnia - Associated symptom of not being able to sleep a night has been going on for many years. Currently sleeps about 4 hours per night however severity can be as bad as keeping him up all night. Difficulty falling asleep. Attributes this to increased anxiety and which complicates the situation. Modifying factors include tylenol pm, melatonin and nyquil which have provided minimal. He has tried valium in the past which helped some, but was hit or miss if it worked. States he has tried to adjust his sleep patterns.   2.) Obesity/Fatigue - Associated symptom of obesity has been going on for almost his entire life. Current diet of high protein and low carbs which has helped to decrease his weight about 10 lbs. 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 No structured exercise program currently.   No Known Allergies   Outpatient Prescriptions Prior to Visit  Medication Sig Dispense Refill  . ibuprofen (ADVIL,MOTRIN) 800 MG tablet Take 1 tablet (800 mg total) by mouth 3 (three) times daily. 21 tablet 0  . naproxen (NAPROSYN) 500 MG tablet Take 1 tablet (500 mg total) by mouth 2 (two) times daily with a meal. 30 tablet 0  . oxyCODONE-acetaminophen (PERCOCET/ROXICET) 5-325 MG per tablet Take 1-2 tablets by mouth every 4 (four) hours as needed for severe pain. 15 tablet 0   No facility-administered medications prior to visit.     Past Medical History  Diagnosis Date  . Chicken pox   . Blood in stool   . GERD  (gastroesophageal reflux disease)   . Migraines      History reviewed. No pertinent past surgical history.   Family History  Problem Relation Age of Onset  . Adopted: Yes  . Heart disease Mother      History   Social History  . Marital Status: Married    Spouse Name: N/A  . Number of Children: N/A  . Years of Education: N/A   Occupational History  . Not on file.   Social History Main Topics  . Smoking status: Current Every Day Smoker -- 1.00 packs/day for 30 years    Types: Cigarettes  . Smokeless tobacco: Never Used  . Alcohol Use: Yes     Comment: rarely  . Drug Use: Yes    Special: Marijuana  . Sexual Activity: No   Other Topics Concern  . Not on file   Social History Narrative    Review of Systems  Constitutional: Positive for fatigue.  Psychiatric/Behavioral: Positive for sleep disturbance.      Objective:    BP 140/94 mmHg  Pulse 61  Temp(Src) 98.1 F (36.7 C) (Oral)  Resp 18  Ht 5\' 10"  (1.778 m)  Wt 305 lb (138.347 kg)  BMI 43.76 kg/m2  SpO2 97% Nursing note and vital signs reviewed.  Physical Exam  Constitutional: He is oriented to person, place, and time. He appears well-developed  and well-nourished. No distress.  Obese male seated in the chair, dressed appropriately for the situation and appears his stated age.  Cardiovascular: Normal rate, regular rhythm, normal heart sounds and intact distal pulses.   Pulmonary/Chest: Effort normal and breath sounds normal.  Neurological: He is alert and oriented to person, place, and time.  Skin: Skin is warm and dry.  Psychiatric: His behavior is normal. Judgment and thought content normal. His mood appears anxious.       Assessment & Plan:

## 2014-08-19 NOTE — Assessment & Plan Note (Signed)
Blood pressure noted to be elevated today. Follow-up next office visit.

## 2014-08-20 ENCOUNTER — Telehealth: Payer: Self-pay | Admitting: Family

## 2014-08-20 NOTE — Telephone Encounter (Signed)
Please inform patient that his blood work shows his B12 and thyroid are both normal, therefore most likely not the reason for his fatigue. We cannot rule out weight, anxiety/depression, testosterone or cardiovascular disease.

## 2014-08-20 NOTE — Telephone Encounter (Signed)
Pt aware of results 

## 2015-02-26 ENCOUNTER — Emergency Department (HOSPITAL_COMMUNITY)
Admission: EM | Admit: 2015-02-26 | Discharge: 2015-02-26 | Disposition: A | Discharge status: Home / Self Care | Payer: 59 | Attending: Emergency Medicine | Admitting: Emergency Medicine

## 2015-02-26 ENCOUNTER — Encounter (HOSPITAL_COMMUNITY): Payer: Self-pay | Admitting: Emergency Medicine

## 2015-02-26 DIAGNOSIS — B029 Zoster without complications: Secondary | ICD-10-CM | POA: Insufficient documentation

## 2015-02-26 DIAGNOSIS — R208 Other disturbances of skin sensation: Secondary | ICD-10-CM | POA: Diagnosis present

## 2015-02-26 DIAGNOSIS — F17210 Nicotine dependence, cigarettes, uncomplicated: Secondary | ICD-10-CM | POA: Diagnosis not present

## 2015-02-26 DIAGNOSIS — Z8719 Personal history of other diseases of the digestive system: Secondary | ICD-10-CM | POA: Insufficient documentation

## 2015-02-26 DIAGNOSIS — Z79899 Other long term (current) drug therapy: Secondary | ICD-10-CM | POA: Insufficient documentation

## 2015-02-26 DIAGNOSIS — Z791 Long term (current) use of non-steroidal anti-inflammatories (NSAID): Secondary | ICD-10-CM | POA: Insufficient documentation

## 2015-02-26 DIAGNOSIS — Z8679 Personal history of other diseases of the circulatory system: Secondary | ICD-10-CM | POA: Diagnosis not present

## 2015-02-26 HISTORY — DX: Zoster without complications: B02.9

## 2015-02-26 MED ORDER — VALACYCLOVIR HCL 1 G PO TABS: 1000.0000 mg | ORAL_TABLET | Freq: Three times a day (TID) | ORAL | Status: AC

## 2015-02-26 MED ORDER — OXYCODONE-ACETAMINOPHEN 5-325 MG PO TABS: 1.0000 | ORAL_TABLET | ORAL | Status: DC | PRN

## 2015-02-26 NOTE — ED Notes (Signed)
Patient c/o what he thinks is a shingles outbreak, has had the same in the past twice. Patient does not have a rash and states the 2nd time he had the shingles he had only a small rash with it.

## 2015-02-26 NOTE — Discharge Instructions (Signed)
Take ibuprofen for pain. Percocet for severe pain only. Take Valtrex as prescribed until all gone. Follow-up with your doctor.   Shingles Shingles, which is also known as herpes zoster, is an infection that causes a painful skin rash and fluid-filled blisters. Shingles is not related to genital herpes, which is a sexually transmitted infection.   Shingles only develops in people who:  Have had chickenpox.  Have received the chickenpox vaccine. (This is rare.) CAUSES Shingles is caused by varicella-zoster virus (VZV). This is the same virus that causes chickenpox. After exposure to VZV, the virus stays in the body in an inactive (dormant) state. Shingles develops if the virus reactivates. This can happen many years after the initial exposure to VZV. It is not known what causes this virus to reactivate. RISK FACTORS People who have had chickenpox or received the chickenpox vaccine are at risk for shingles. Infection is more common in people who:  Are older than age 42.  Have a weakened defense (immune) system, such as those with HIV, AIDS, or cancer.  Are taking medicines that weaken the immune system, such as transplant medicines.  Are under great stress. SYMPTOMS Early symptoms of this condition include itching, tingling, and pain in an area on your skin. Pain may be described as burning, stabbing, or throbbing. A few days or weeks after symptoms start, a painful red rash appears, usually on one side of the body in a bandlike or beltlike pattern. The rash eventually turns into fluid-filled blisters that break open, scab over, and dry up in about 2-3 weeks. At any time during the infection, you may also develop:  A fever.  Chills.  A headache.  An upset stomach. DIAGNOSIS This condition is diagnosed with a skin exam. Sometimes, skin or fluid samples are taken from the blisters before a diagnosis is made. These samples are examined under a microscope or sent to a lab for  testing. TREATMENT There is no specific cure for this condition. Your health care provider will probably prescribe medicines to help you manage pain, recover more quickly, and avoid long-term problems. Medicines may include:  Antiviral drugs.  Anti-inflammatory drugs.  Pain medicines. If the area involved is on your face, you may be referred to a specialist, such as an eye doctor (ophthalmologist) or an ear, nose, and throat (ENT) doctor to help you avoid eye problems, chronic pain, or disability. HOME CARE INSTRUCTIONS Medicines  Take medicines only as directed by your health care provider.  Apply an anti-itch or numbing cream to the affected area as directed by your health care provider. Blister and Rash Care  Take a cool bath or apply cool compresses to the area of the rash or blisters as directed by your health care provider. This may help with pain and itching.  Keep your rash covered with a loose bandage (dressing). Wear loose-fitting clothing to help ease the pain of material rubbing against the rash.  Keep your rash and blisters clean with mild soap and cool water or as directed by your health care provider.  Check your rash every day for signs of infection. These include redness, swelling, and pain that lasts or increases.  Do not pick your blisters.  Do not scratch your rash. General Instructions  Rest as directed by your health care provider.  Keep all follow-up visits as directed by your health care provider. This is important.  Until your blisters scab over, your infection can cause chickenpox in people who have never had it or  been vaccinated against it. To prevent this from happening, avoid contact with other people, especially:  Babies.  Pregnant women.  Children who have eczema.  Elderly people who have transplants.  People who have chronic illnesses, such as leukemia or AIDS. SEEK MEDICAL CARE IF:  Your pain is not relieved with prescribed  medicines.  Your pain does not get better after the rash heals.  Your rash looks infected. Signs of infection include redness, swelling, and pain that lasts or increases. SEEK IMMEDIATE MEDICAL CARE IF:  The rash is on your face or nose.  You have facial pain, pain around your eye area, or loss of feeling on one side of your face.  You have ear pain or you have ringing in your ear.  You have loss of taste.  Your condition gets worse.   This information is not intended to replace advice given to you by your health care provider. Make sure you discuss any questions you have with your health care provider.   Document Released: 03/20/2005 Document Revised: 04/10/2014 Document Reviewed: 01/29/2014 Elsevier Interactive Patient Education [employer].

## 2015-02-26 NOTE — ED Provider Notes (Signed)
CSN: [id number]     Arrival date & time 02/26/15  9562 History   First MD Initiated Contact with Patient 02/26/15 [phone]     Chief Complaint  Patient presents with  . Herpes Zoster    possible, history of same      (Consider location/radiation/quality/duration/timing/severity/associated sxs/prior Treatment) HPI Carlos Tapia is a 44 y.o. male with a history of shingles, migraines, acid reflux, presents to emergency department complaining of burning sensation to the left back. Patient states that burning and pain started 3 days ago. He denies any fever or chills. He denies any back injuries. He states pain is mainly to the left side. He denies any urinary symptoms. He states pain is not worse with movement but reports some burning sensation to the skin that is worse with touch. He states he has had shingles before and reports that pain was exactly the same. He denies any rash however. He denies any urinary symptoms. No blood in his urine. No fever or chills.  Past Medical History  Diagnosis Date  . Chicken pox   . Blood in stool   . GERD (gastroesophageal reflux disease)   . Migraines   . Shingles 2009, 2010   History reviewed. No pertinent past surgical history. Family History  Problem Relation Age of Onset  . Adopted: Yes  . Heart disease Mother    Social History  Substance Use Topics  . Smoking status: Current Every Day Smoker -- 0.75 packs/day for 30 years    Types: Cigarettes  . Smokeless tobacco: Never Used  . Alcohol Use: Yes     Comment: rarely    Review of Systems  Constitutional: Negative for fever and chills.  Respiratory: Negative for cough, chest tightness and shortness of breath.   Cardiovascular: Negative for chest pain, palpitations and leg swelling.  Genitourinary: Negative for dysuria, urgency, frequency and hematuria.  Musculoskeletal: Positive for arthralgias. Negative for myalgias, neck pain and neck stiffness.  Skin: Positive for rash.    Allergic/Immunologic: Negative for immunocompromised state.  Neurological: Negative for numbness and headaches.  All other systems reviewed and are negative.     Allergies  Review of patient's allergies indicates no known allergies.  Home Medications   Prior to Admission medications   Medication Sig Start Date End Date Taking? Authorizing Provider  ibuprofen (ADVIL,MOTRIN) 800 MG tablet Take 1 tablet (800 mg total) by mouth 3 (three) times daily. 04/28/13   Glynn Octave, MD  Lorcaserin HCl 10 MG TABS Take 10 mg by mouth 2 (two) times daily. 08/19/14   Veryl Speak, FNP  naproxen (NAPROSYN) 500 MG tablet Take 1 tablet (500 mg total) by mouth 2 (two) times daily with a meal. 03/12/14   Harle Battiest, NP  oxyCODONE-acetaminophen (PERCOCET/ROXICET) 5-325 MG per tablet Take 1-2 tablets by mouth every 4 (four) hours as needed for severe pain. 08/12/14   Mirian Mo, MD  temazepam (RESTORIL) 15 MG capsule Take 1-2 capsules (15-30 mg total) by mouth at bedtime as needed for sleep. 08/19/14   Veryl Speak, FNP   BP 141/95 mmHg  Pulse 68  Temp(Src) 97.6 F (36.4 C) (Oral)  Resp 19  SpO2 98% Physical Exam  Constitutional: He is oriented to person, place, and time. He appears well-developed and well-nourished. No distress.  HENT:  Head: Normocephalic and atraumatic.  Eyes: Conjunctivae are normal.  Neck: Neck supple.  Cardiovascular: Normal rate, regular rhythm and normal heart sounds.   Pulmonary/Chest: Effort normal. No respiratory distress. He has  no wheezes. He has no rales.  No rash noted over her thorax or flank. Pain with even slight touch over left ribs and left posterior thorax.   Musculoskeletal: He exhibits no edema.       Arms: Neurological: He is alert and oriented to person, place, and time.  Skin: Skin is warm and dry.  Nursing note and vitals reviewed.   ED Course  Procedures (including critical care time) Labs Review Labs Reviewed - No data to  display  Imaging Review No results found. I have personally reviewed and evaluated these images and lab results as part of my medical decision-making.   EKG Interpretation None      MDM   Final diagnoses:  Shingles     patient with burning sensation to the skin. There is no rash. He states it feels just like when he has shingles in the past. Patient does have sensitivity to even light touch over left flank. Will start on Valtrex for possible shingles. Also explained to him this could be nerve related. He denies any urinary symptoms. Will discharge home with Percocet for pain. Follow-up with primary care doctor if not improving.  Filed Vitals:   02/26/15 0945  BP: 141/95  Pulse: 68  Temp: 97.6 F (36.4 C)  TempSrc: Oral  Resp: 19  SpO2: 98%     Jaynie Crumbleatyana Hajime Asfaw, PA-C 02/26/15 1705  Gerhard Munchobert Lockwood, MD 02/27/15 1206

## 2015-05-19 ENCOUNTER — Ambulatory Visit (INDEPENDENT_AMBULATORY_CARE_PROVIDER_SITE_OTHER): Payer: 59 | Admitting: Family

## 2015-05-19 ENCOUNTER — Encounter: Payer: Self-pay | Admitting: Family

## 2015-05-19 VITALS — BP 132/88 | HR 81 | Temp 98.3°F | Resp 18 | Wt 302.0 lb

## 2015-05-19 DIAGNOSIS — J0100 Acute maxillary sinusitis, unspecified: Secondary | ICD-10-CM

## 2015-05-19 DIAGNOSIS — J019 Acute sinusitis, unspecified: Secondary | ICD-10-CM | POA: Insufficient documentation

## 2015-05-19 MED ORDER — METHYLPREDNISOLONE ACETATE 80 MG/ML IJ SUSP
80.0000 mg | Freq: Once | INTRAMUSCULAR | Status: AC
  Administered 2015-05-19: 80 mg via INTRAMUSCULAR

## 2015-05-19 MED ORDER — AMOXICILLIN-POT CLAVULANATE 875-125 MG PO TABS: 1.0000 | ORAL_TABLET | Freq: Two times a day (BID) | ORAL | Status: DC

## 2015-05-19 NOTE — Assessment & Plan Note (Signed)
Symptoms and exam consistent with bacterial sinus infection. Start Augnmentin. In office injection of Depomedrol provided. Continue over-the-counter medications as needed for symptom relief and supportive care. Follow-up if symptoms worsen or fail to improve.

## 2015-05-19 NOTE — Patient Instructions (Signed)
Thank you for choosing [facility].  Summary/Instructions:  Your prescription(s) have been submitted to your pharmacy or been printed and provided for you. Please take as directed and contact our office if you believe you are having problem(s) with the medication(s) or have any questions.  If your symptoms worsen or fail to improve, please contact our office for further instruction, or in case of emergency go directly to the emergency room at the closest medical facility.   General Recommendations:    Please drink plenty of fluids.  Get plenty of rest   Sleep in humidified air  Use saline nasal sprays  Netti pot   OTC Medications:  Decongestants - helps relieve congestion   Flonase (generic fluticasone) or Nasacort (generic triamcinolone) - please make sure to use the "cross-over" technique at a 45 degree angle towards the opposite eye as opposed to straight up the nasal passageway.   Sudafed (generic pseudoephedrine - Note this is the one that is available behind the pharmacy counter); Products with phenylephrine (-PE) may also be used but is often not as effective as pseudoephedrine.   If you have HIGH BLOOD PRESSURE - Coricidin HBP; AVOID any product that is -D as this contains pseudoephedrine which may increase your blood pressure.  Afrin (oxymetazoline) every 6-8 hours for up to 3 days.   Allergies - helps relieve runny nose, itchy eyes and sneezing   Claritin (generic loratidine), Allegra (fexofenidine), or Zyrtec (generic cyrterizine) for runny nose. These medications should not cause drowsiness.  Note - Benadryl (generic diphenhydramine) may be used however may cause drowsiness  Cough -   Delsym or Robitussin (generic dextromethorphan)  Expectorants - helps loosen mucus to ease removal   Mucinex (generic guaifenesin) as directed on the package.  Headaches / General Aches   Tylenol (generic acetaminophen) - DO NOT EXCEED 3 grams (3,000 mg) in a 24  hour time period  Advil/Motrin (generic ibuprofen)   Sore Throat -   Salt water gargle   Chloraseptic (generic benzocaine) spray or lozenges / Sucrets (generic dyclonine)    Sinusitis Sinusitis is redness, soreness, and inflammation of the paranasal sinuses. Paranasal sinuses are air pockets within the bones of your face (beneath the eyes, the middle of the forehead, or above the eyes). In healthy paranasal sinuses, mucus is able to drain out, and air is able to circulate through them by way of your nose. However, when your paranasal sinuses are inflamed, mucus and air can become trapped. This can allow bacteria and other germs to grow and cause infection. Sinusitis can develop quickly and last only a short time (acute) or continue over a long period (chronic). Sinusitis that lasts for more than 12 weeks is considered chronic.  CAUSES  Causes of sinusitis include:  Allergies.  Structural abnormalities, such as displacement of the cartilage that separates your nostrils (deviated septum), which can decrease the air flow through your nose and sinuses and affect sinus drainage.  Functional abnormalities, such as when the small hairs (cilia) that line your sinuses and help remove mucus do not work properly or are not present. SIGNS AND SYMPTOMS  Symptoms of acute and chronic sinusitis are the same. The primary symptoms are pain and pressure around the affected sinuses. Other symptoms include:  Upper toothache.  Earache.  Headache.  Bad breath.  Decreased sense of smell and taste.  A cough, which worsens when you are lying flat.  Fatigue.  Fever.  Thick drainage from your nose, which often is green and may   contain pus (purulent).  Swelling and warmth over the affected sinuses. DIAGNOSIS  Your health care provider will perform a physical exam. During the exam, your health care provider may:  Look in your nose for signs of abnormal growths in your nostrils (nasal  polyps).  Tap over the affected sinus to check for signs of infection.  View the inside of your sinuses (endoscopy) using an imaging device that has a light attached (endoscope). If your health care provider suspects that you have chronic sinusitis, one or more of the following tests may be recommended:  Allergy tests.  Nasal culture. A sample of mucus is taken from your nose, sent to a lab, and screened for bacteria.  Nasal cytology. A sample of mucus is taken from your nose and examined by your health care provider to determine if your sinusitis is related to an allergy. TREATMENT  Most cases of acute sinusitis are related to a viral infection and will resolve on their own within 10 days. Sometimes medicines are prescribed to help relieve symptoms (pain medicine, decongestants, nasal steroid sprays, or saline sprays).  However, for sinusitis related to a bacterial infection, your health care provider will prescribe antibiotic medicines. These are medicines that will help kill the bacteria causing the infection.  Rarely, sinusitis is caused by a fungal infection. In theses cases, your health care provider will prescribe antifungal medicine. For some cases of chronic sinusitis, surgery is needed. Generally, these are cases in which sinusitis recurs more than 3 times per year, despite other treatments. HOME CARE INSTRUCTIONS   Drink plenty of water. Water helps thin the mucus so your sinuses can drain more easily.  Use a humidifier.  Inhale steam 3 to 4 times a day (for example, sit in the bathroom with the shower running).  Apply a warm, moist washcloth to your face 3 to 4 times a day, or as directed by your health care provider.  Use saline nasal sprays to help moisten and clean your sinuses.  Take medicines only as directed by your health care provider.  If you were prescribed either an antibiotic or antifungal medicine, finish it all even if you start to feel better. SEEK IMMEDIATE  MEDICAL CARE IF:  You have increasing pain or severe headaches.  You have nausea, vomiting, or drowsiness.  You have swelling around your face.  You have vision problems.  You have a stiff neck.  You have difficulty breathing. MAKE SURE YOU:   Understand these instructions.  Will watch your condition.  Will get help right away if you are not doing well or get worse. Document Released: 03/20/2005 Document Revised: 08/04/2013 Document Reviewed: 04/04/2011 Exit[employer] Patient Information 2015 [employer]. This information is not intended to replace advice given to you by your health care provider. Make sure you discuss any questions you have with your health care provider.   

## 2015-05-19 NOTE — Progress Notes (Signed)
   Subjective:    Patient ID: Carlos Tapia, male    DOB: [birth year], 45 y.o.   MRN: [id number]  Chief Complaint  Patient presents with  . Nasal Congestion    sore throat started 4 days ago, dry cough, sneezing, sinus pressure.     HPI:  Carlos Tapia is a 45 y.o. male who  has a past medical history of Chicken pox; Blood in stool; GERD (gastroesophageal reflux disease); Migraines; and Shingles (2009, 2010). and presents today for an acute office visit.  This is a new problem. Associated symptom of sore throat, dry cough, sneezing and sinus pressure have been going on for about 4 days. No fevers. Modifying factors include Dayquil and Alkaseltzer which helped with his symptoms. Course of the symptoms originally with some improvement and then worsening. No recent antibiotic use.   No Known Allergies   Current Outpatient Prescriptions on File Prior to Visit  Medication Sig Dispense Refill  . Lorcaserin HCl 10 MG TABS Take 10 mg by mouth 2 (two) times daily. 30 tablet 0   No current facility-administered medications on file prior to visit.     Review of Systems  Constitutional: Negative for fever and chills.  HENT: Positive for congestion and sinus pressure. Negative for sore throat.   Respiratory: Positive for cough.   Neurological: Positive for headaches.      Objective:    BP 132/88 mmHg  Pulse 81  Temp(Src) 98.3 F (36.8 C) (Oral)  Resp 18  Wt 302 lb (136.986 kg)  SpO2 97% Nursing note and vital signs reviewed.  Physical Exam  Constitutional: He is oriented to person, place, and time. He appears well-developed and well-nourished.  HENT:  Right Ear: Hearing, tympanic membrane, external ear and ear canal normal.  Left Ear: Hearing, tympanic membrane, external ear and ear canal normal.  Nose: Right sinus exhibits maxillary sinus tenderness and frontal sinus tenderness. Left sinus exhibits maxillary sinus tenderness and frontal sinus tenderness.  Mouth/Throat: Uvula  is midline, oropharynx is clear and moist and mucous membranes are normal.  Neck: Neck supple.  Cardiovascular: Normal rate, regular rhythm, normal heart sounds and intact distal pulses.   Pulmonary/Chest: Effort normal and breath sounds normal.  Neurological: He is alert and oriented to person, place, and time.  Skin: Skin is warm and dry.       Assessment & Plan:   Problem List Items Addressed This Visit      Respiratory   Acute sinus infection - Primary    Symptoms and exam consistent with bacterial sinus infection. Start Augnmentin. In office injection of Depomedrol provided. Continue over-the-counter medications as needed for symptom relief and supportive care. Follow-up if symptoms worsen or fail to improve.      Relevant Medications   amoxicillin-clavulanate (AUGMENTIN) 875-125 MG tablet   methylPREDNISolone acetate (DEPO-MEDROL) injection 80 mg (Completed)

## 2015-06-28 ENCOUNTER — Emergency Department (HOSPITAL_BASED_OUTPATIENT_CLINIC_OR_DEPARTMENT_OTHER)
Admission: EM | Admit: 2015-06-28 | Discharge: 2015-06-28 | Disposition: A | Discharge status: Home / Self Care | Payer: Worker's Compensation | Attending: Emergency Medicine | Admitting: Emergency Medicine

## 2015-06-28 ENCOUNTER — Encounter (HOSPITAL_BASED_OUTPATIENT_CLINIC_OR_DEPARTMENT_OTHER): Payer: Self-pay | Admitting: Emergency Medicine

## 2015-06-28 DIAGNOSIS — Y9389 Activity, other specified: Secondary | ICD-10-CM | POA: Insufficient documentation

## 2015-06-28 DIAGNOSIS — Z8619 Personal history of other infectious and parasitic diseases: Secondary | ICD-10-CM | POA: Diagnosis not present

## 2015-06-28 DIAGNOSIS — IMO0002 Reserved for concepts with insufficient information to code with codable children: Secondary | ICD-10-CM

## 2015-06-28 DIAGNOSIS — Z8719 Personal history of other diseases of the digestive system: Secondary | ICD-10-CM | POA: Diagnosis not present

## 2015-06-28 DIAGNOSIS — S51812A Laceration without foreign body of left forearm, initial encounter: Secondary | ICD-10-CM | POA: Insufficient documentation

## 2015-06-28 DIAGNOSIS — Y9289 Other specified places as the place of occurrence of the external cause: Secondary | ICD-10-CM | POA: Insufficient documentation

## 2015-06-28 DIAGNOSIS — Y990 Civilian activity done for income or pay: Secondary | ICD-10-CM | POA: Insufficient documentation

## 2015-06-28 DIAGNOSIS — F17210 Nicotine dependence, cigarettes, uncomplicated: Secondary | ICD-10-CM | POA: Insufficient documentation

## 2015-06-28 DIAGNOSIS — S59912A Unspecified injury of left forearm, initial encounter: Secondary | ICD-10-CM | POA: Diagnosis present

## 2015-06-28 DIAGNOSIS — Z23 Encounter for immunization: Secondary | ICD-10-CM | POA: Diagnosis not present

## 2015-06-28 DIAGNOSIS — W268XXA Contact with other sharp object(s), not elsewhere classified, initial encounter: Secondary | ICD-10-CM | POA: Insufficient documentation

## 2015-06-28 DIAGNOSIS — Z8679 Personal history of other diseases of the circulatory system: Secondary | ICD-10-CM | POA: Insufficient documentation

## 2015-06-28 MED ORDER — LIDOCAINE-EPINEPHRINE 2 %-1:100000 IJ SOLN
20.0000 mL | Freq: Once | INTRAMUSCULAR | Status: DC
Start: 2015-06-28 — End: 2015-06-28

## 2015-06-28 MED ORDER — OXYCODONE-ACETAMINOPHEN 5-325 MG PO TABS
2.0000 | ORAL_TABLET | Freq: Once | ORAL | Status: AC
  Administered 2015-06-28: 2 via ORAL
  Filled 2015-06-28: qty 2

## 2015-06-28 MED ORDER — OXYCODONE-ACETAMINOPHEN 5-325 MG PO TABS: 1.0000 | ORAL_TABLET | Freq: Three times a day (TID) | ORAL | Status: DC | PRN

## 2015-06-28 MED ORDER — LIDOCAINE-EPINEPHRINE 2 %-1:100000 IJ SOLN
20.0000 mL | Freq: Once | INTRAMUSCULAR | Status: AC
  Administered 2015-06-28: 20 mL via INTRADERMAL
  Filled 2015-06-28: qty 1

## 2015-06-28 MED ORDER — TETANUS-DIPHTH-ACELL PERTUSSIS 5-2.5-18.5 LF-MCG/0.5 IM SUSP
0.5000 mL | Freq: Once | INTRAMUSCULAR | Status: AC
  Administered 2015-06-28: 0.5 mL via INTRAMUSCULAR
  Filled 2015-06-28: qty 0.5

## 2015-06-28 MED ORDER — TETANUS-DIPHTH-ACELL PERTUSSIS 5-2.5-18.5 LF-MCG/0.5 IM SUSP: 0.5000 mL | Freq: Once | INTRAMUSCULAR | Status: DC

## 2015-06-28 MED FILL — OXYCODONE/APAP 5-325: 5-325 | 2 days supply | Qty: 10 | Fill #0

## 2015-06-28 NOTE — ED Provider Notes (Signed)
CSN: [id number]     Arrival date & time 06/28/15  1033 History   First MD Initiated Contact with Patient 06/28/15 1101     Chief Complaint  Patient presents with  . Extremity Laceration     (Consider location/radiation/quality/duration/timing/severity/associated sxs/prior Treatment) Patient is a 45 y.o. male presenting with skin laceration.  Laceration Location:  Shoulder/arm Shoulder/arm laceration location:  L forearm Length (cm):  12 Depth:  Through underlying tissue Quality: avulsion and straight   Bleeding: controlled with pressure   Time since incident:  1 hour Laceration mechanism:  Metal edge Pain details:    Severity:  Mild   Timing:  Constant Foreign body present:  No foreign bodies Worsened by:  Nothing tried Ineffective treatments:  None tried Tetanus status:  Unknown   Past Medical History  Diagnosis Date  . Chicken pox   . Blood in stool   . GERD (gastroesophageal reflux disease)   . Migraines   . Shingles 2009, 2010   No past surgical history on file. Family History  Problem Relation Age of Onset  . Adopted: Yes  . Heart disease Mother    Social History  Substance Use Topics  . Smoking status: Current Every Day Smoker -- 0.75 packs/day for 30 years    Types: Cigarettes  . Smokeless tobacco: Never Used  . Alcohol Use: Yes     Comment: rarely    Review of Systems  Constitutional: Negative for fever, chills and fatigue.  Skin: Positive for wound. Negative for pallor.  All other systems reviewed and are negative.     Allergies  Review of patient's allergies indicates no known allergies.  Home Medications   Prior to Admission medications   Medication Sig Start Date End Date Taking? Authorizing Provider  ibuprofen (ADVIL,MOTRIN) 800 MG tablet Take 800 mg by mouth every 8 (eight) hours as needed.    Historical Provider, MD  oxyCODONE-acetaminophen (PERCOCET/ROXICET) 5-325 MG tablet Take 1-2 tablets by mouth every 8 (eight) hours as needed  for severe pain. 06/28/15   Marily MemosJason Stellar Gensel, MD   There were no vitals taken for this visit. Physical Exam  Constitutional: He is oriented to person, place, and time. He appears well-developed and well-nourished.  HENT:  Head: Normocephalic and atraumatic.  Eyes: Conjunctivae are normal. Pupils are equal, round, and reactive to light.  Neck: Normal range of motion.  Cardiovascular: Normal rate.   Pulmonary/Chest: Effort normal. No respiratory distress.  Abdominal: Soft. He exhibits no distension. There is no tenderness.  Musculoskeletal: Normal range of motion. He exhibits no edema or tenderness.  Neurological: He is alert and oriented to person, place, and time. No cranial nerve deficit. Coordination normal.  Skin: Skin is warm and dry.  12 cm straight, avulsion on left forearm Normal grip, wrist extension/flexion.  Normal individual finger flexion and extension at all joints.  Intact radial pulse Intact sensation  Nursing note and vitals reviewed.   ED Course  .Marland Kitchen.Laceration Repair Date/Time: 06/28/2015 12:37 PM Performed by: Marily MemosMESNER, Yari Szeliga Authorized by: Marily MemosMESNER, Mariyah Upshaw Consent: Verbal consent obtained. Risks and benefits: risks, benefits and alternatives were discussed Consent given by: patient Patient understanding: patient states understanding of the procedure being performed Patient consent: the patient's understanding of the procedure matches consent given Time out: Immediately prior to procedure a "time out" was called to verify the correct patient, procedure, equipment, support staff and site/side marked as required. Body area: upper extremity Location details: left lower arm Laceration length: 12 cm Foreign bodies: no foreign bodies Tendon  involvement: none Nerve involvement: none Vascular damage: no Anesthesia: local infiltration Local anesthetic: lidocaine 1% with epinephrine Anesthetic total: 5 ml Patient sedated: no Preparation: Patient was prepped and draped in the  usual sterile fashion. Irrigation solution: saline Irrigation method: syringe Amount of cleaning: standard Debridement: minimal Degree of undermining: none Skin closure: 3-0 Prolene Number of sutures: 18 Technique: simple Approximation: close Approximation difficulty: simple Dressing: gauze roll, antibiotic ointment and non-adhesive packing strip Patient tolerance: Patient tolerated the procedure well with no immediate complications   (including critical care time) Labs Review Labs Reviewed - No data to display  Imaging Review No results found. I have personally reviewed and evaluated these images and lab results as part of my medical decision-making.   EKG Interpretation None      MDM   Final diagnoses:  Laceration  Need for Tdap vaccination    Laceration repaired as above. NVI before and after procedure. Irrigated thoroughly, low suspicion for foreign bodies. TDAP updated. Will rtn 7-10 days for wound check and suture removal.   New Prescriptions: New Prescriptions   OXYCODONE-ACETAMINOPHEN (PERCOCET/ROXICET) 5-325 MG TABLET    Take 1-2 tablets by mouth every 8 (eight) hours as needed for severe pain.     I have personally and contemperaneously reviewed labs and imaging and used in my decision making as above.   A medical screening exam was performed and I feel the patient has had an appropriate workup for their chief complaint at this time and likelihood of emergent condition existing is low. Their vital signs are stable. They have been counseled on decision, discharge, follow up and which symptoms necessitate immediate return to the emergency department.  They verbally stated understanding and agreement with plan and discharged in stable condition.      Marily Memos, MD 06/28/15 [phone]

## 2015-06-28 NOTE — ED Notes (Signed)
Pt injured left arm at work.  Pt has large laceration to anterior lower arm.  Bleeding controlled.

## 2015-07-06 ENCOUNTER — Encounter (HOSPITAL_BASED_OUTPATIENT_CLINIC_OR_DEPARTMENT_OTHER): Payer: Self-pay | Admitting: Emergency Medicine

## 2015-07-06 ENCOUNTER — Emergency Department (HOSPITAL_BASED_OUTPATIENT_CLINIC_OR_DEPARTMENT_OTHER)
Admission: EM | Admit: 2015-07-06 | Discharge: 2015-07-06 | Disposition: A | Discharge status: Home / Self Care | Payer: Worker's Compensation | Attending: Emergency Medicine | Admitting: Emergency Medicine

## 2015-07-06 DIAGNOSIS — Z4802 Encounter for removal of sutures: Secondary | ICD-10-CM

## 2015-07-06 DIAGNOSIS — Z8719 Personal history of other diseases of the digestive system: Secondary | ICD-10-CM | POA: Insufficient documentation

## 2015-07-06 DIAGNOSIS — F17210 Nicotine dependence, cigarettes, uncomplicated: Secondary | ICD-10-CM | POA: Diagnosis not present

## 2015-07-06 DIAGNOSIS — Z8679 Personal history of other diseases of the circulatory system: Secondary | ICD-10-CM | POA: Insufficient documentation

## 2015-07-06 DIAGNOSIS — Z8619 Personal history of other infectious and parasitic diseases: Secondary | ICD-10-CM | POA: Insufficient documentation

## 2015-07-06 NOTE — ED Notes (Signed)
Suture removal kit at bedside. PA at bedside.

## 2015-07-06 NOTE — ED Notes (Signed)
Patient reports that he is here to have his suture removed.

## 2015-07-06 NOTE — Discharge Instructions (Signed)

## 2015-07-06 NOTE — ED Provider Notes (Signed)
CSN: [id number]     Arrival date & time 07/06/15  1349 History   First MD Initiated Contact with Patient 07/06/15 1418     Chief Complaint  Patient presents with  . Suture / Staple Removal     (Consider location/radiation/quality/duration/timing/severity/associated sxs/prior Treatment) HPI Comments: Patient presents today for suture removal.  He reports that he was cut in the left forearm by a metal edge and had sutures placed in the ED on 06/28/15.  He denies any drainage from the area.  Denies fever, chills, nausea, vomiting, numbness, or tingling.    Patient is a 45 y.o. male presenting with suture removal. The history is provided by the patient.  Suture / Staple Removal    Past Medical History  Diagnosis Date  . Chicken pox   . Blood in stool   . GERD (gastroesophageal reflux disease)   . Migraines   . Shingles 2009, 2010   History reviewed. No pertinent past surgical history. Family History  Problem Relation Age of Onset  . Adopted: Yes  . Heart disease Mother    Social History  Substance Use Topics  . Smoking status: Current Every Day Smoker -- 0.75 packs/day for 30 years    Types: Cigarettes  . Smokeless tobacco: Never Used  . Alcohol Use: Yes     Comment: rarely    Review of Systems  All other systems reviewed and are negative.     Allergies  Review of patient's allergies indicates no known allergies.  Home Medications   Prior to Admission medications   Medication Sig Start Date End Date Taking? Authorizing Provider  ibuprofen (ADVIL,MOTRIN) 800 MG tablet Take 800 mg by mouth every 8 (eight) hours as needed.    Historical Provider, MD  oxyCODONE-acetaminophen (PERCOCET/ROXICET) 5-325 MG tablet Take 1-2 tablets by mouth every 8 (eight) hours as needed for severe pain. 06/28/15   Barbara CowerJason Mesner, MD   BP 136/90 mmHg  Pulse 73  Temp(Src) 98.1 F (36.7 C) (Oral)  SpO2 99% Physical Exam  Constitutional: He appears well-developed and well-nourished.  HENT:   Head: Normocephalic and atraumatic.  Neck: Normal range of motion. Neck supple.  Cardiovascular: Normal rate, regular rhythm and normal heart sounds.   Pulmonary/Chest: Effort normal and breath sounds normal.  Musculoskeletal: Normal range of motion.  Neurological: He is alert.  Sensation of left hand intact  Skin: Skin is warm and dry.  Laceration of the left forearm appears to be healing well.  Sutures intact.  Skin well approximated.  No drainage.  No surrounding erythema, edema, or warmth.    Psychiatric: He has a normal mood and affect.  Nursing note and vitals reviewed.   ED Course  Procedures (including critical care time) Labs Review Labs Reviewed - No data to display  Imaging Review No results found. I have personally reviewed and evaluated these images and lab results as part of my medical decision-making.   EKG Interpretation None      MDM   Final diagnoses:  None   Patient presents today for suture removal. Sutures placed in the ED on 06/28/15.  No signs of infection at this time.  Skin well approximated.  Patient stable for discharge.  Return precautions given.   Santiago GladHeather Reneka Nebergall, PA-C 07/06/15 1500  Lyndal Pulleyaniel Knott, MD 07/07/15 [phone]

## 2015-09-06 ENCOUNTER — Encounter: Payer: Self-pay | Admitting: Family

## 2015-09-06 ENCOUNTER — Ambulatory Visit (INDEPENDENT_AMBULATORY_CARE_PROVIDER_SITE_OTHER): Payer: 59 | Admitting: Family

## 2015-09-06 VITALS — BP 132/82 | HR 62 | Temp 97.9°F | Resp 16 | Ht 70.0 in | Wt 311.0 lb

## 2015-09-06 DIAGNOSIS — G4700 Insomnia, unspecified: Secondary | ICD-10-CM | POA: Diagnosis not present

## 2015-09-06 DIAGNOSIS — N481 Balanitis: Secondary | ICD-10-CM | POA: Diagnosis not present

## 2015-09-06 MED ORDER — PHENTERMINE HCL 37.5 MG PO CAPS: 37.5000 mg | ORAL_CAPSULE | ORAL | Status: DC

## 2015-09-06 MED ORDER — TRAZODONE HCL 50 MG PO TABS: 50.0000 mg | ORAL_TABLET | Freq: Every evening | ORAL | Status: DC | PRN

## 2015-09-06 NOTE — Progress Notes (Signed)
Pre visit review using our clinic review tool, if applicable. No additional management support is needed unless otherwise documented below in the visit note. 

## 2015-09-06 NOTE — Assessment & Plan Note (Addendum)
BMI of 44 that is resistant Belviq and previous improvement with phentermine. Discussed importance of nutrition and physical activity to help with lifestyle improvements. Restart phentermine. Recommend increasing physical activity to 30 minutes of moderate level activity daily. Encourage nutritional intake that focuses on nutrient dense foods and is moderate, varied, and balanced and is low in saturated fats and processed/sugary foods. Follow up in 1 month or sooner.

## 2015-09-06 NOTE — Patient Instructions (Signed)
Thank you for choosing [facility].  Summary/Instructions:  Your prescription(s) have been submitted to your pharmacy or been printed and provided for you. Please take as directed and contact our office if you believe you are having problem(s) with the medication(s) or have any questions.  If your symptoms worsen or fail to improve, please contact our office for further instruction, or in case of emergency go directly to the emergency room at the closest medical facility.    Phentermine tablets or capsules What is this medicine? PHENTERMINE (FEN ter meen) decreases your appetite. It is used with a reduced calorie diet and exercise to help you lose weight. This medicine may be used for other purposes; ask your health care provider or pharmacist if you have questions. What should I tell my health care provider before I take this medicine? They need to know if you have any of these conditions: -agitation -glaucoma -heart disease -high blood pressure -history of substance abuse -lung disease called Primary Pulmonary Hypertension (PPH) -taken an MAOI like Carbex, Eldepryl, Marplan, Nardil, or Parnate in last 14 days -thyroid disease -an unusual or allergic reaction to phentermine, other medicines, foods, dyes, or preservatives -pregnant or trying to get pregnant -breast-feeding How should I use this medicine? Take this medicine by mouth with a glass of water. Follow the directions on the prescription label. This medicine is usually taken 30 minutes before or 1 to 2 hours after breakfast. Avoid taking this medicine in the evening. It may interfere with sleep. Take your doses at regular intervals. Do not take your medicine more often than directed. Talk to your pediatrician regarding the use of this medicine in children. Special care may be needed. Overdosage: If you think you have taken too much of this medicine contact a poison control center or emergency room at once. NOTE: This  medicine is only for you. Do not share this medicine with others. What if I miss a dose? If you miss a dose, take it as soon as you can. If it is almost time for your next dose, take only that dose. Do not take double or extra doses. What may interact with this medicine? Do not take this medicine with any of the following medications: -duloxetine -MAOIs like Carbex, Eldepryl, Marplan, Nardil, and Parnate -medicines for colds or breathing difficulties like pseudoephedrine or phenylephrine -procarbazine -sibutramine -SSRIs like citalopram, escitalopram, fluoxetine, fluvoxamine, paroxetine, and sertraline -stimulants like dexmethylphenidate, methylphenidate or modafinil -venlafaxine This medicine may also interact with the following medications: -medicines for diabetes This list may not describe all possible interactions. Give your health care provider a list of all the medicines, herbs, non-prescription drugs, or dietary supplements you use. Also tell them if you smoke, drink alcohol, or use illegal drugs. Some items may interact with your medicine. What should I watch for while using this medicine? Notify your physician immediately if you become short of breath while doing your normal activities. Do not take this medicine within 6 hours of bedtime. It can keep you from getting to sleep. Avoid drinks that contain caffeine and try to stick to a regular bedtime every night. This medicine was intended to be used in addition to a healthy diet and exercise. The best results are achieved this way. This medicine is only indicated for short-term use. Eventually your weight loss may level out. At that point, the drug will only help you maintain your new weight. Do not increase or in any way change your dose without consulting your doctor. You may  get drowsy or dizzy. Do not drive, use machinery, or do anything that needs mental alertness until you know how this medicine affects you. Do not stand or sit up  quickly, especially if you are an older patient. This reduces the risk of dizzy or fainting spells. Alcohol may increase dizziness and drowsiness. Avoid alcoholic drinks. What side effects may I notice from receiving this medicine? Side effects that you should report to your doctor or health care professional as soon as possible: -chest pain, palpitations -depression or severe changes in mood -increased blood pressure -irritability -nervousness or restlessness -severe dizziness -shortness of breath -problems urinating -unusual swelling of the legs -vomiting Side effects that usually do not require medical attention (report to your doctor or health care professional if they continue or are bothersome): -blurred vision or other eye problems -changes in sexual ability or desire -constipation or diarrhea -difficulty sleeping -dry mouth or unpleasant taste -headache -nausea This list may not describe all possible side effects. Call your doctor for medical advice about side effects. You may report side effects to FDA at 1-800-FDA-1088. Where should I keep my medicine? Keep out of the reach of children. This medicine can be abused. Keep your medicine in a safe place to protect it from theft. Do not share this medicine with anyone. Selling or giving away this medicine is dangerous and against the law. This medicine may cause accidental overdose and death if taken by other adults, children, or pets. Mix any unused medicine with a substance like cat litter or coffee grounds. Then throw the medicine away in a sealed container like a sealed bag or a coffee can with a lid. Do not use the medicine after the expiration date. Store at room temperature between 20 and 25 degrees C (68 and 77 degrees F). Keep container tightly closed. NOTE: This sheet is a summary. It may not cover all possible information. If you have questions about this medicine, talk to your doctor, pharmacist, or health care provider.     2016, Elsevier/Gold Standard. (2013-12-09 16:19:53)  Insomnia Insomnia is a sleep disorder that makes it difficult to fall asleep or to stay asleep. Insomnia can cause tiredness (fatigue), low energy, difficulty concentrating, mood swings, and poor performance at work or school.  There are three different ways to classify insomnia:  Difficulty falling asleep.  Difficulty staying asleep.  Waking up too early in the morning. Any type of insomnia can be long-term (chronic) or short-term (acute). Both are common. Short-term insomnia usually lasts for three months or less. Chronic insomnia occurs at least three times a week for longer than three months. CAUSES  Insomnia may be caused by another condition, situation, or substance, such as:  Anxiety.  Certain medicines.  Gastroesophageal reflux disease (GERD) or other gastrointestinal conditions.  Asthma or other breathing conditions.  Restless legs syndrome, sleep apnea, or other sleep disorders.  Chronic pain.  Menopause. This may include hot flashes.  Stroke.  Abuse of alcohol, tobacco, or illegal drugs.  Depression.  Caffeine.   Neurological disorders, such as Alzheimer disease.  An overactive thyroid (hyperthyroidism). The cause of insomnia may not be known. RISK FACTORS Risk factors for insomnia include:  Gender. Women are more commonly affected than men.  Age. Insomnia is more common as you get older.  Stress. This may involve your professional or personal life.  Income. Insomnia is more common in people with lower income.  Lack of exercise.   Irregular work schedule or night shifts.  Traveling between  different time zones. SIGNS AND SYMPTOMS If you have insomnia, trouble falling asleep or trouble staying asleep is the main symptom. This may lead to other symptoms, such as:  Feeling fatigued.  Feeling nervous about going to sleep.  Not feeling rested in the morning.  Having trouble  concentrating.  Feeling irritable, anxious, or depressed. TREATMENT  Treatment for insomnia depends on the cause. If your insomnia is caused by an underlying condition, treatment will focus on addressing the condition. Treatment may also include:   Medicines to help you sleep.  Counseling or therapy.  Lifestyle adjustments. HOME CARE INSTRUCTIONS   Take medicines only as directed by your health care provider.  Keep regular sleeping and waking hours. Avoid naps.  Keep a sleep diary to help you and your health care provider figure out what could be causing your insomnia. Include:   When you sleep.  When you wake up during the night.  How well you sleep.   How rested you feel the next day.  Any side effects of medicines you are taking.  What you eat and drink.   Make your bedroom a comfortable place where it is easy to fall asleep:  Put up shades or special blackout curtains to block light from outside.  Use a white noise machine to block noise.  Keep the temperature cool.   Exercise regularly as directed by your health care provider. Avoid exercising right before bedtime.  Use relaxation techniques to manage stress. Ask your health care provider to suggest some techniques that may work well for you. These may include:  Breathing exercises.  Routines to release muscle tension.  Visualizing peaceful scenes.  Cut back on alcohol, caffeinated beverages, and cigarettes, especially close to bedtime. These can disrupt your sleep.  Do not overeat or eat spicy foods right before bedtime. This can lead to digestive discomfort that can make it hard for you to sleep.  Limit screen use before bedtime. This includes:  Watching TV.  Using your smartphone, tablet, and computer.  Stick to a routine. This can help you fall asleep faster. Try to do a quiet activity, brush your teeth, and go to bed at the same time each night.  Get out of bed if you are still awake after 15  minutes of trying to sleep. Keep the lights down, but try reading or doing a quiet activity. When you feel sleepy, go back to bed.  Make sure that you drive carefully. Avoid driving if you feel very sleepy.  Keep all follow-up appointments as directed by your health care provider. This is important. SEEK MEDICAL CARE IF:   You are tired throughout the day or have trouble in your daily routine due to sleepiness.  You continue to have sleep problems or your sleep problems get worse. SEEK IMMEDIATE MEDICAL CARE IF:   You have serious thoughts about hurting yourself or someone else.   This information is not intended to replace advice given to you by your health care provider. Make sure you discuss any questions you have with your health care provider.   Document Released: 03/17/2000 Document Revised: 12/09/2014 Document Reviewed: 12/19/2013 Elsevier Interactive Patient Education 2016 [employer]   Trazodone tablets What is this medicine? TRAZODONE (TRAZ oh done) is used to treat depression. This medicine may be used for other purposes; ask your health care provider or pharmacist if you have questions. What should I tell my health care provider before I take this medicine? They need to know if you  have any of these conditions: -attempted suicide or thinking about it -bipolar disorder -bleeding problems -glaucoma -heart disease, or previous heart attack -irregular heart beat -kidney or liver disease -low levels of sodium in the blood -an unusual or allergic reaction to trazodone, other medicines, foods, dyes or preservatives -pregnant or trying to get pregnant -breast-feeding How should I use this medicine? Take this medicine by mouth with a glass of water. Follow the directions on the prescription label. Take this medicine shortly after a meal or a light snack. Take your medicine at regular intervals. Do not take your medicine more often than directed. Do not stop taking this  medicine suddenly except upon the advice of your doctor. Stopping this medicine too quickly may cause serious side effects or your condition may worsen. A special MedGuide will be given to you by the pharmacist with each prescription and refill. Be sure to read this information carefully each time. Talk to your pediatrician regarding the use of this medicine in children. Special care may be needed. Overdosage: If you think you have taken too much of this medicine contact a poison control center or emergency room at once. NOTE: This medicine is only for you. Do not share this medicine with others. What if I miss a dose? If you miss a dose, take it as soon as you can. If it is almost time for your next dose, take only that dose. Do not take double or extra doses. What may interact with this medicine? Do not take this medicine with any of the following medications: -certain medicines for fungal infections like fluconazole, itraconazole, ketoconazole, posaconazole, voriconazole -cisapride -dofetilide -dronedarone -linezolid -MAOIs like Carbex, Eldepryl, Marplan, Nardil, and Parnate -mesoridazine -methylene blue (injected into a vein) -pimozide -saquinavir -thioridazine -ziprasidone This medicine may also interact with the following medications: -alcohol -antiviral medicines for HIV or AIDS -aspirin and aspirin-like medicines -barbiturates like phenobarbital -certain medicines for blood pressure, heart disease, irregular heart beat -certain medicines for depression, anxiety, or psychotic disturbances -certain medicines for migraine headache like almotriptan, eletriptan, frovatriptan, naratriptan, rizatriptan, sumatriptan, zolmitriptan -certain medicines for seizures like carbamazepine and phenytoin -certain medicines for sleep -certain medicines that treat or prevent blood clots like dalteparin, enoxaparin, warfarin -digoxin -fentanyl -lithium -NSAIDS, medicines for pain and  inflammation, like ibuprofen or naproxen -other medicines that prolong the QT interval (cause an abnormal heart rhythm) -rasagiline -supplements like St. John's wort, kava kava, valerian -tramadol -tryptophan This list may not describe all possible interactions. Give your health care provider a list of all the medicines, herbs, non-prescription drugs, or dietary supplements you use. Also tell them if you smoke, drink alcohol, or use illegal drugs. Some items may interact with your medicine. What should I watch for while using this medicine? Tell your doctor if your symptoms do not get better or if they get worse. Visit your doctor or health care professional for regular checks on your progress. Because it may take several weeks to see the full effects of this medicine, it is important to continue your treatment as prescribed by your doctor. Patients and their families should watch out for new or worsening thoughts of suicide or depression. Also watch out for sudden changes in feelings such as feeling anxious, agitated, panicky, irritable, hostile, aggressive, impulsive, severely restless, overly excited and hyperactive, or not being able to sleep. If this happens, especially at the beginning of treatment or after a change in dose, call your health care professional. Bonita Quin may get drowsy or dizzy. Do not  drive, use machinery, or do anything that needs mental alertness until you know how this medicine affects you. Do not stand or sit up quickly, especially if you are an older patient. This reduces the risk of dizzy or fainting spells. Alcohol may interfere with the effect of this medicine. Avoid alcoholic drinks. This medicine may cause dry eyes and blurred vision. If you wear contact lenses you may feel some discomfort. Lubricating drops may help. See your eye doctor if the problem does not go away or is severe. Your mouth may get dry. Chewing sugarless gum, sucking hard candy and drinking plenty of water  may help. Contact your doctor if the problem does not go away or is severe. What side effects may I notice from receiving this medicine? Side effects that you should report to your doctor or health care professional as soon as possible: -allergic reactions like skin rash, itching or hives, swelling of the face, lips, or tongue -fast, irregular heartbeat -feeling faint or lightheaded, falls -painful erections or other sexual dysfunction -suicidal thoughts or other mood changes -trembling Side effects that usually do not require medical attention (report to your doctor or health care professional if they continue or are bothersome): -constipation -headache -muscle aches or pains -nausea, vomiting -unusually weak or tired This list may not describe all possible side effects. Call your doctor for medical advice about side effects. You may report side effects to FDA at 1-800-FDA-1088. Where should I keep my medicine? Keep out of the reach of children. Store at room temperature between 15 and 30 degrees C (59 to 86 degrees F). Protect from light. Keep container tightly closed. Throw away any unused medicine after the expiration date. NOTE: This sheet is a summary. It may not cover all possible information. If you have questions about this medicine, talk to your doctor, pharmacist, or health care provider.    2016, Elsevier/Gold Standard. (2012-10-21 15:46:28)

## 2015-09-06 NOTE — Assessment & Plan Note (Signed)
Symptoms and exam consistent with insomnia that is resistant to over the counter medications and generalized sleep hygiene. May be the result of uncontrolled anxiety. Start trazodone. Continue with sleep hygiene and discussed stress management. Follow up in 1 month or sooner.

## 2015-09-06 NOTE — Progress Notes (Signed)
Subjective:    Patient ID: Carlos Tapia, male    DOB: 03/23/1971, 45 y.o.   MRN: [id number]  Chief Complaint  Patient presents with  . Insomnia  . Obesity  . Penis Pain    HPI:  Carlos Tapia is a 45 y.o. male who  has a past medical history of Chicken pox; Blood in stool; GERD (gastroesophageal reflux disease); Migraines; and Shingles (2009, 2010). and presents today for an office visit.  1.) Penile irritation - This is a new problem. Associated symptom inflammation and redness located around the foreskin of his penis has been going on for about 4 weeks and has been gradually improving. He is uncircumcised and reports that he does clean area on occasion. Denies any modifying factors or attempted treatments. No fevers, discharge, urinary frequency or urgency.  2.) Insomnia - Continues to experience the associated symptom of anxiety that he reports is leading to insomnia. Previously treated with marijuna. Currently getting about 3-4 hours of sleep. Modifying factors include OTC melatonin and sleep aids which has provided minimal relief. Caffeine intake is limited to the morning. He has worked on good sleep hygiene the best he can.   3.) Obesity - Currently working on losing weight and indicates that he has tried nutrition and exercise. Previously treated with Belviq with no success. He did have some previous success with phentermine in the past. Describes making adjustment to his nutritional intake focusing on proteins and greens.   Wt Readings from Last 3 Encounters:  09/06/15 311 lb (141.069 kg)  05/19/15 302 lb (136.986 kg)  08/19/14 305 lb (138.347 kg)    No Known Allergies   No current outpatient prescriptions on file prior to visit.   No current facility-administered medications on file prior to visit.    Past Medical History  Diagnosis Date  . Chicken pox   . Blood in stool   . GERD (gastroesophageal reflux disease)   . Migraines   . Shingles 2009, 2010      Review of Systems  Constitutional: Negative for fever and chills.  Respiratory: Negative for chest tightness, shortness of breath and wheezing.   Cardiovascular: Negative for chest pain, palpitations and leg swelling.  Genitourinary: Positive for penile pain. Negative for dysuria, frequency, hematuria, discharge, scrotal swelling and testicular pain.      Objective:    BP 132/82 mmHg  Pulse 62  Temp(Src) 97.9 F (36.6 C) (Oral)  Resp 16  Ht 5\' 10"  (1.778 m)  Wt 311 lb (141.069 kg)  BMI 44.62 kg/m2  SpO2 97% Nursing note and vital signs reviewed.  Physical Exam  Constitutional: He is oriented to person, place, and time. He appears well-developed and well-nourished. No distress.  Cardiovascular: Normal rate, regular rhythm, normal heart sounds and intact distal pulses.   Pulmonary/Chest: Effort normal and breath sounds normal.  Genitourinary: Uncircumcised. No penile erythema or penile tenderness.  Foreskin is tight and not able to be fully retracted.   Neurological: He is alert and oriented to person, place, and time.  Skin: Skin is warm and dry.  Psychiatric: He has a normal mood and affect. His behavior is normal. Judgment and thought content normal.       Assessment & Plan:   Problem List Items Addressed This Visit      Genitourinary   Balanitis    Symptoms and exam consistent with resolving balanitis. Foreskin is very tight with difficult retraction. Given improvement will continue to monitor for the next 2-3 days.  May require medication for complete resolution and possible referral to urology for further management.         Other   Insomnia - Primary    Symptoms and exam consistent with insomnia that is resistant to over the counter medications and generalized sleep hygiene. May be the result of uncontrolled anxiety. Start trazodone. Continue with sleep hygiene and discussed stress management. Follow up in 1 month or sooner.       Relevant Medications    traZODone (DESYREL) 50 MG tablet   Morbid obesity (HCC)    BMI of 44 that is resistant Belviq and previous improvement with phentermine. Discussed importance of nutrition and physical activity to help with lifestyle improvements. Restart phentermine. Recommend increasing physical activity to 30 minutes of moderate level activity daily. Encourage nutritional intake that focuses on nutrient dense foods and is moderate, varied, and balanced and is low in saturated fats and processed/sugary foods. Follow up in 1 month or sooner.       Relevant Medications   phentermine 37.5 MG capsule       I have discontinued Mr. Loftin ibuprofen and oxyCODONE-acetaminophen. I am also having him start on traZODone and phentermine.   Meds ordered this encounter  Medications  . traZODone (DESYREL) 50 MG tablet    Sig: Take 1-2 tablets (50-100 mg total) by mouth at bedtime as needed for sleep.    Dispense:  60 tablet    Refill:  0    Order Specific Question:  Supervising Provider    Answer:  Hillard Danker A [[id number]]  . phentermine 37.5 MG capsule    Sig: Take 1 capsule (37.5 mg total) by mouth every morning.    Dispense:  30 capsule    Refill:  0    Order Specific Question:  Supervising Provider    Answer:  Hillard Danker A [[id number]]     Follow-up: Return in about 1 month (around 10/06/2015), or if symptoms worsen or fail to improve.  Jeanine Luz, FNP

## 2015-09-06 NOTE — Assessment & Plan Note (Signed)
Symptoms and exam consistent with resolving balanitis. Foreskin is very tight with difficult retraction. Given improvement will continue to monitor for the next 2-3 days. May require medication for complete resolution and possible referral to urology for further management.

## 2015-09-20 ENCOUNTER — Encounter: Payer: Self-pay | Admitting: Family

## 2015-10-12 ENCOUNTER — Ambulatory Visit: Payer: 59 | Admitting: Family

## 2015-10-14 ENCOUNTER — Other Ambulatory Visit: Payer: Self-pay | Admitting: Family

## 2015-11-29 ENCOUNTER — Other Ambulatory Visit: Payer: Self-pay | Admitting: Family

## 2016-01-16 ENCOUNTER — Other Ambulatory Visit: Payer: Self-pay | Admitting: Family

## 2016-02-16 ENCOUNTER — Other Ambulatory Visit: Payer: Self-pay | Admitting: Family

## 2016-02-18 NOTE — Telephone Encounter (Signed)
Faxed

## 2016-02-19 ENCOUNTER — Other Ambulatory Visit: Payer: Self-pay | Admitting: Family

## 2016-02-22 NOTE — Telephone Encounter (Signed)
Rx faxed

## 2016-03-06 ENCOUNTER — Other Ambulatory Visit: Payer: Self-pay | Admitting: Family

## 2016-04-03 DIAGNOSIS — K649 Unspecified hemorrhoids: Secondary | ICD-10-CM

## 2016-04-03 HISTORY — DX: Unspecified hemorrhoids: K64.9

## 2016-04-04 IMAGING — CR DG FOOT COMPLETE 3+V*L*
3 series · 3 of 3 positions shown · non-contrast
Comparison: None.

CLINICAL DATA: Injury 3 days ago

EXAM:
LEFT FOOT - COMPLETE 3+ VIEW

[x foot ap left]
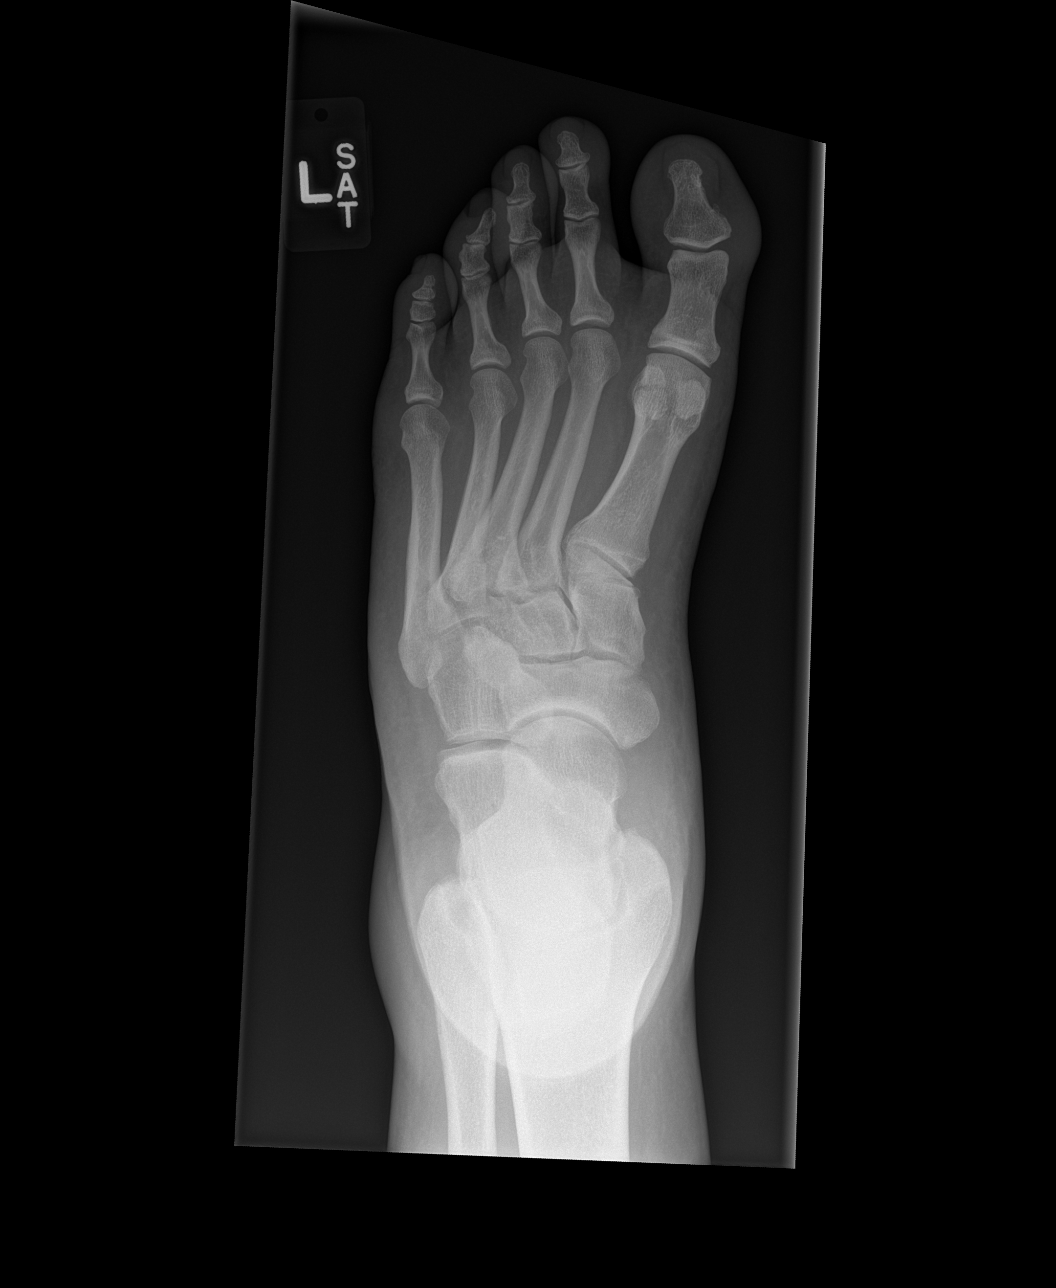

[x foot obl left]
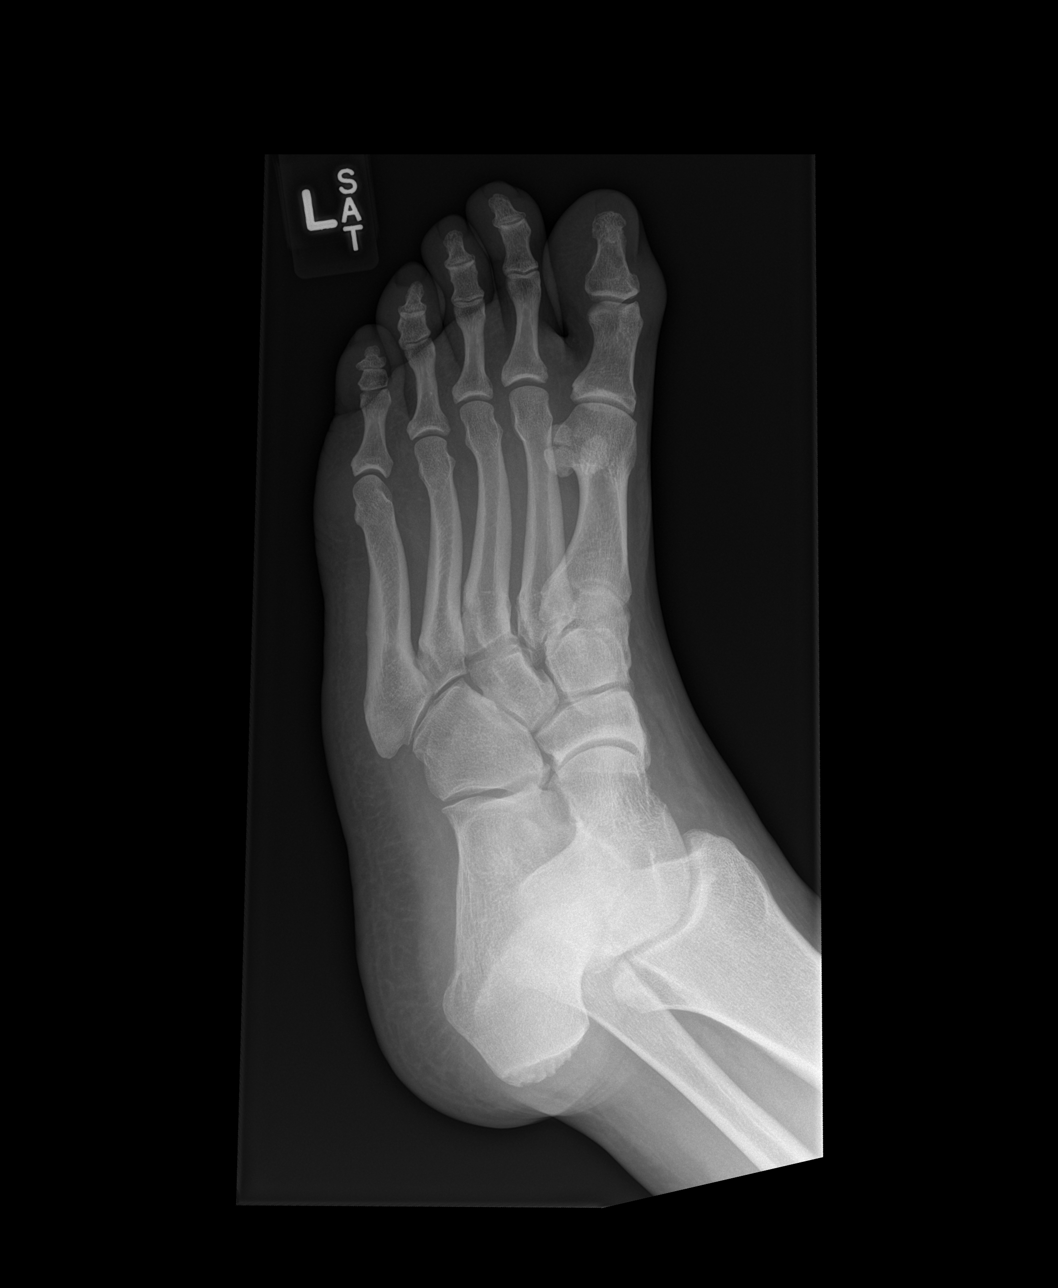

[x foot lat left]
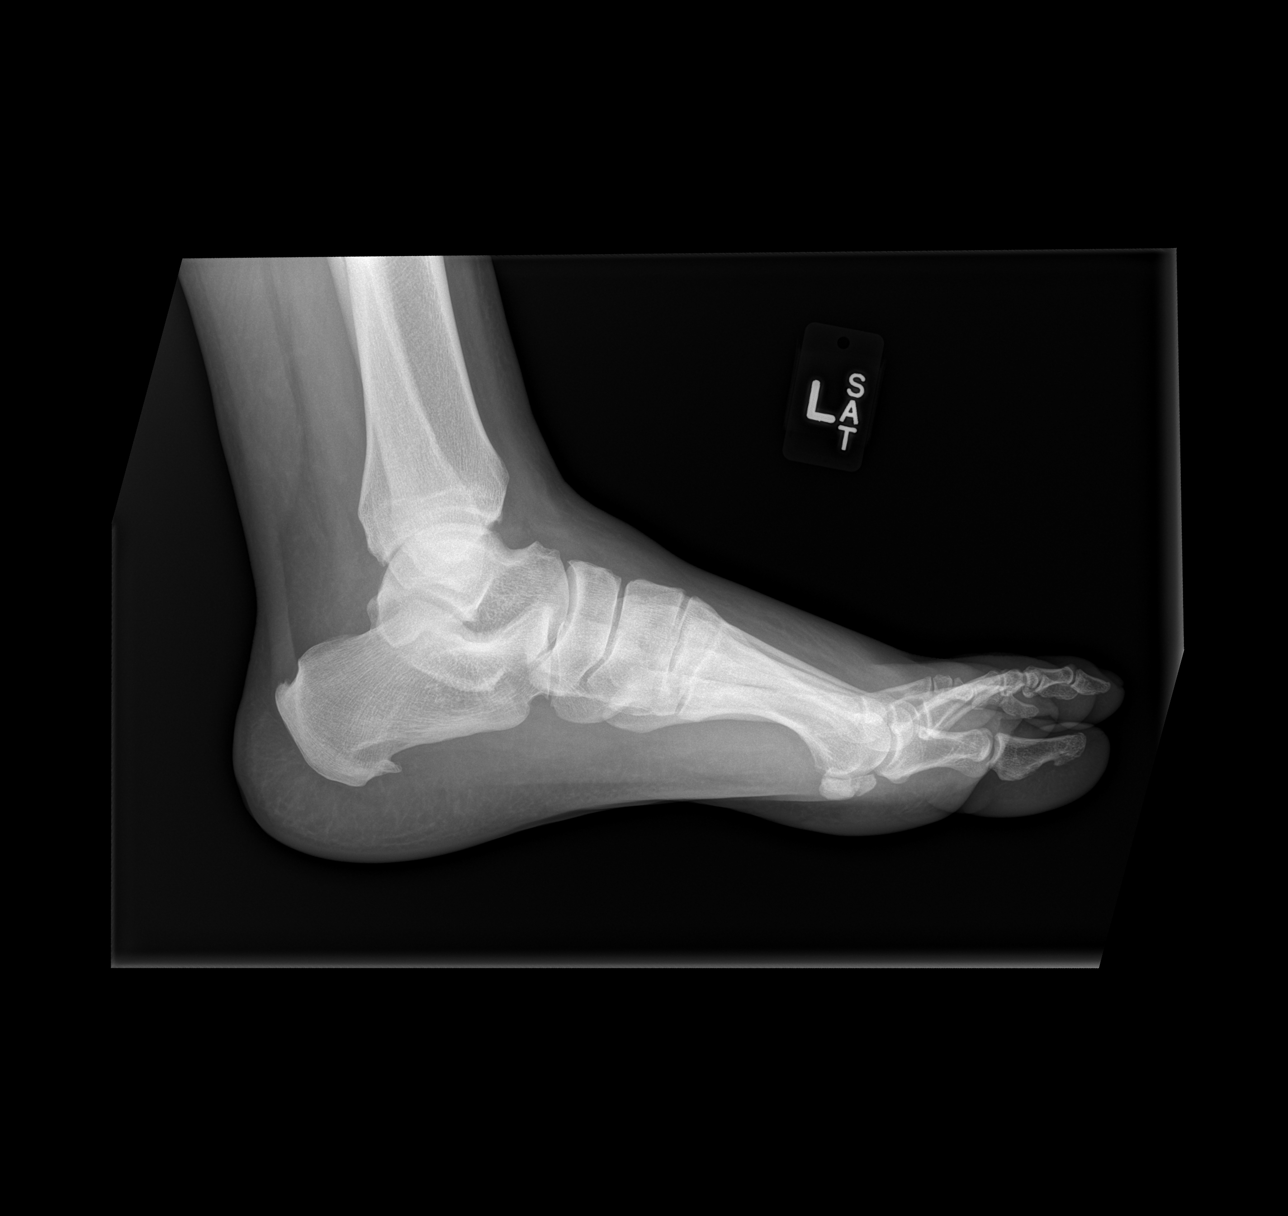

[3 of 3 positions shown; findings below may reference images not displayed]

FINDINGS: There is spurring at the posterior and inferior calcaneus. The
lateral sesamoid is fragmented with a chronic appearance. No acute
fracture or dislocation.
IMPRESSION: No acute bony pathology.

## 2016-04-04 IMAGING — CR DG TIBIA/FIBULA 2V*L*
4 series · 4 of 4 positions shown · non-contrast
Comparison: None.

CLINICAL DATA: Acute left ankle pain and swelling after stepping in
a hole 3 days ago.

EXAM:
LEFT TIBIA AND FIBULA - 2 VIEW

[x tib-fib lat left (1 of 2)]
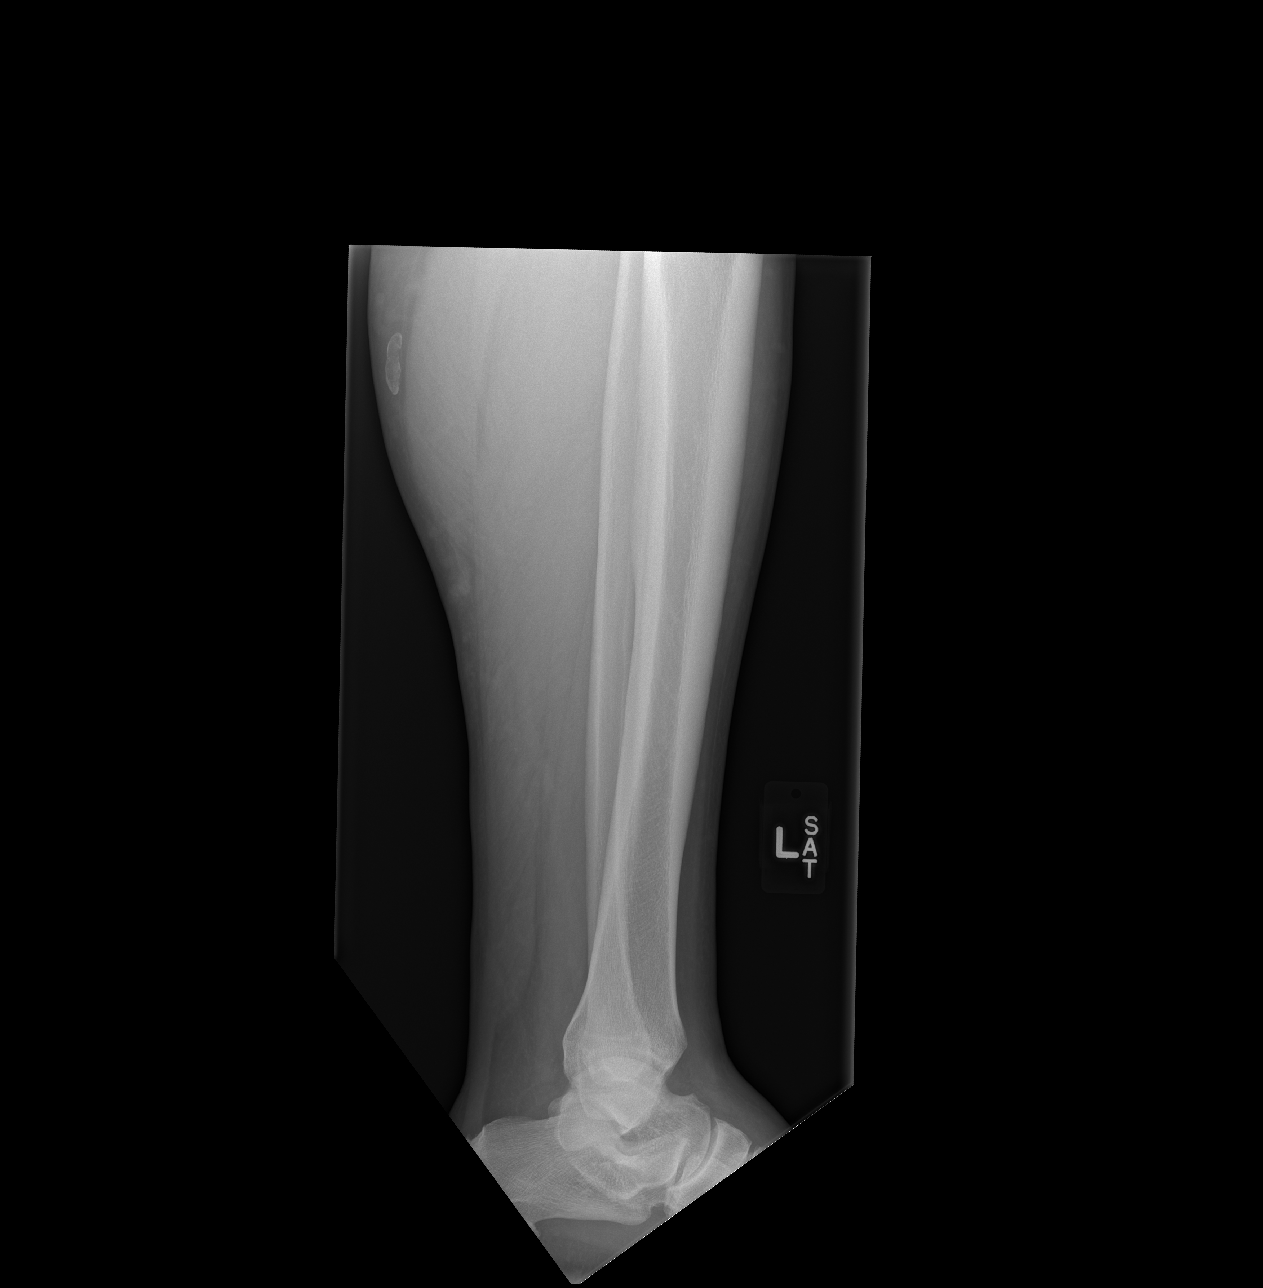

[x tib-fib lat left (2 of 2)]
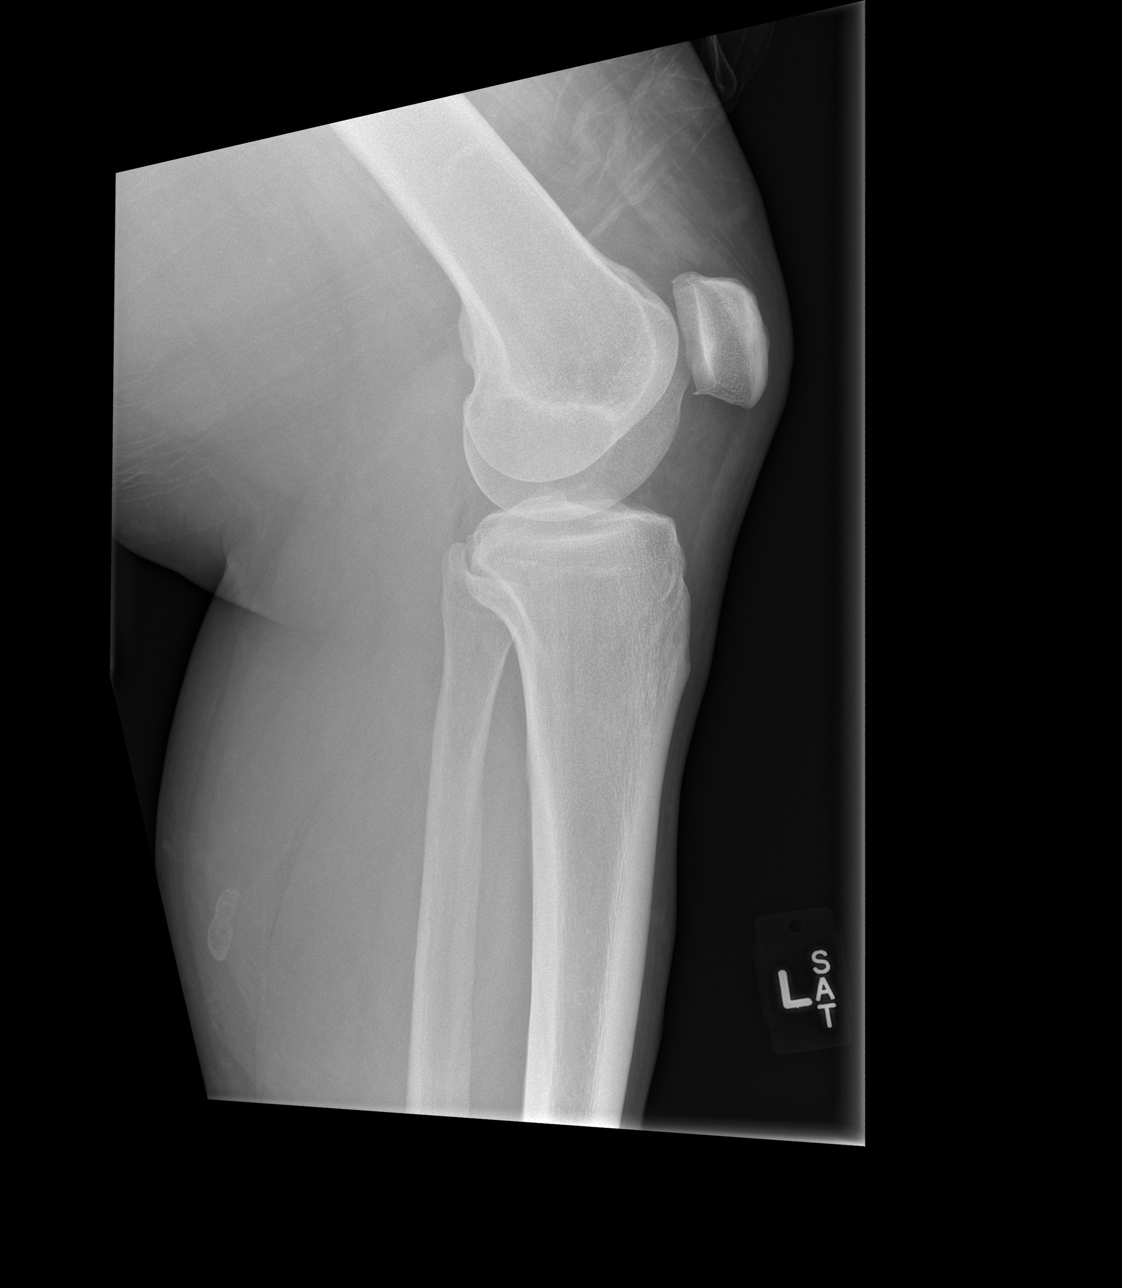

[x tib-fib ap left (1 of 2)]
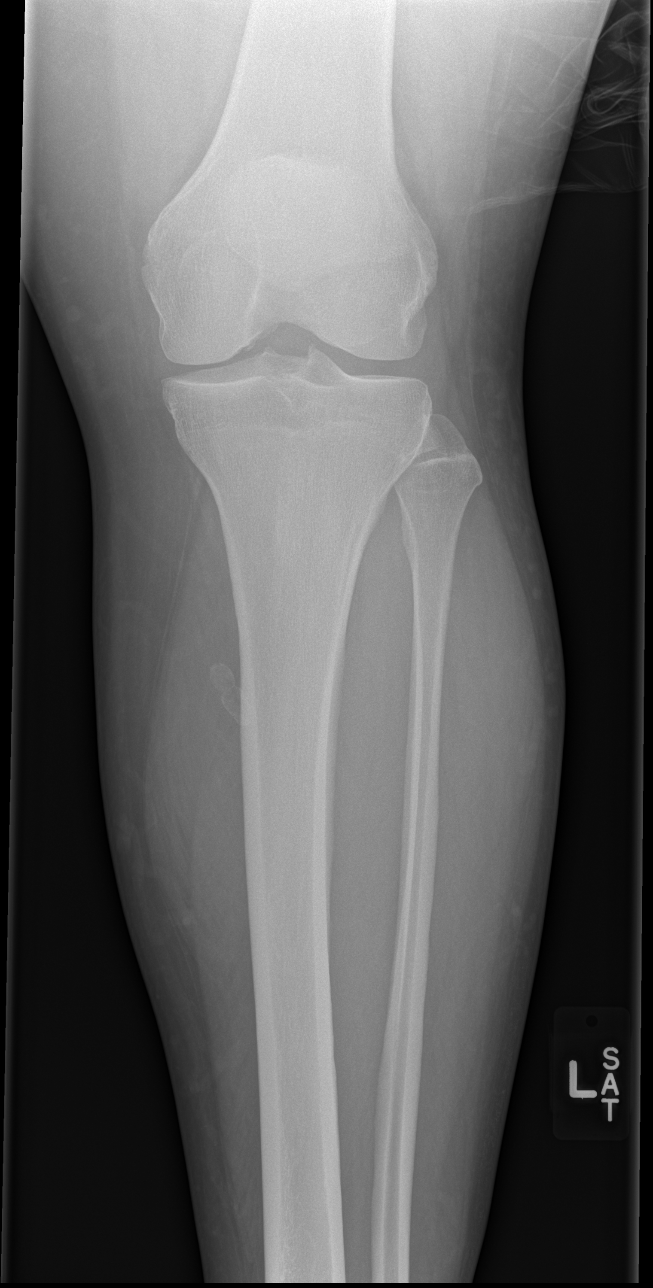

[x tib-fib ap left (2 of 2)]
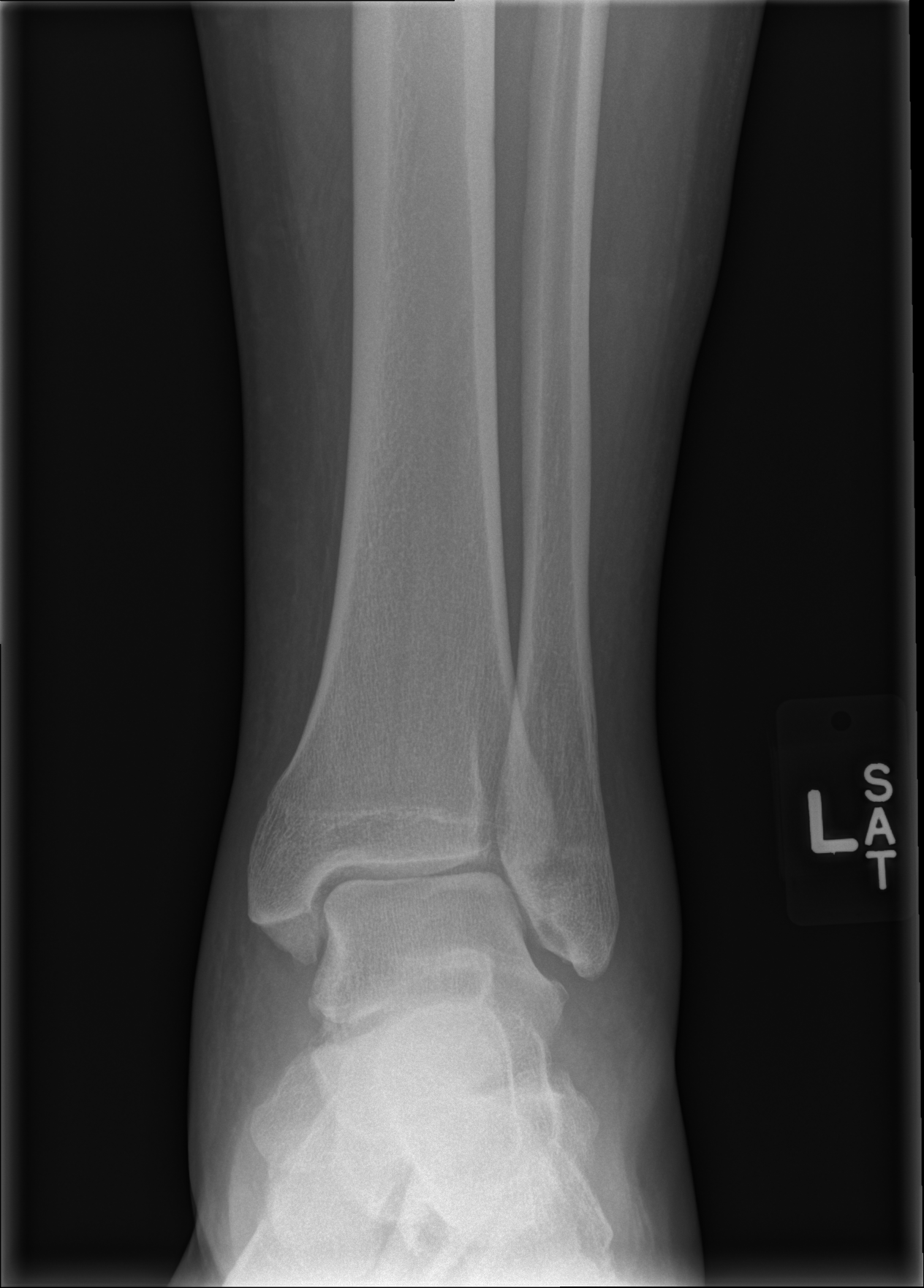

[4 of 4 positions shown; findings below may reference images not displayed]

FINDINGS: There is no evidence of fracture or other focal bone lesions. Soft
tissues are unremarkable.
IMPRESSION: Normal left tibia and fibula.

## 2016-04-04 IMAGING — CR DG ANKLE COMPLETE 3+V*L*
3 series · 3 of 3 positions shown · non-contrast
Comparison: None.

CLINICAL DATA: Left ankle injury

EXAM:
LEFT ANKLE COMPLETE - 3+ VIEW

[x ankle lat left]
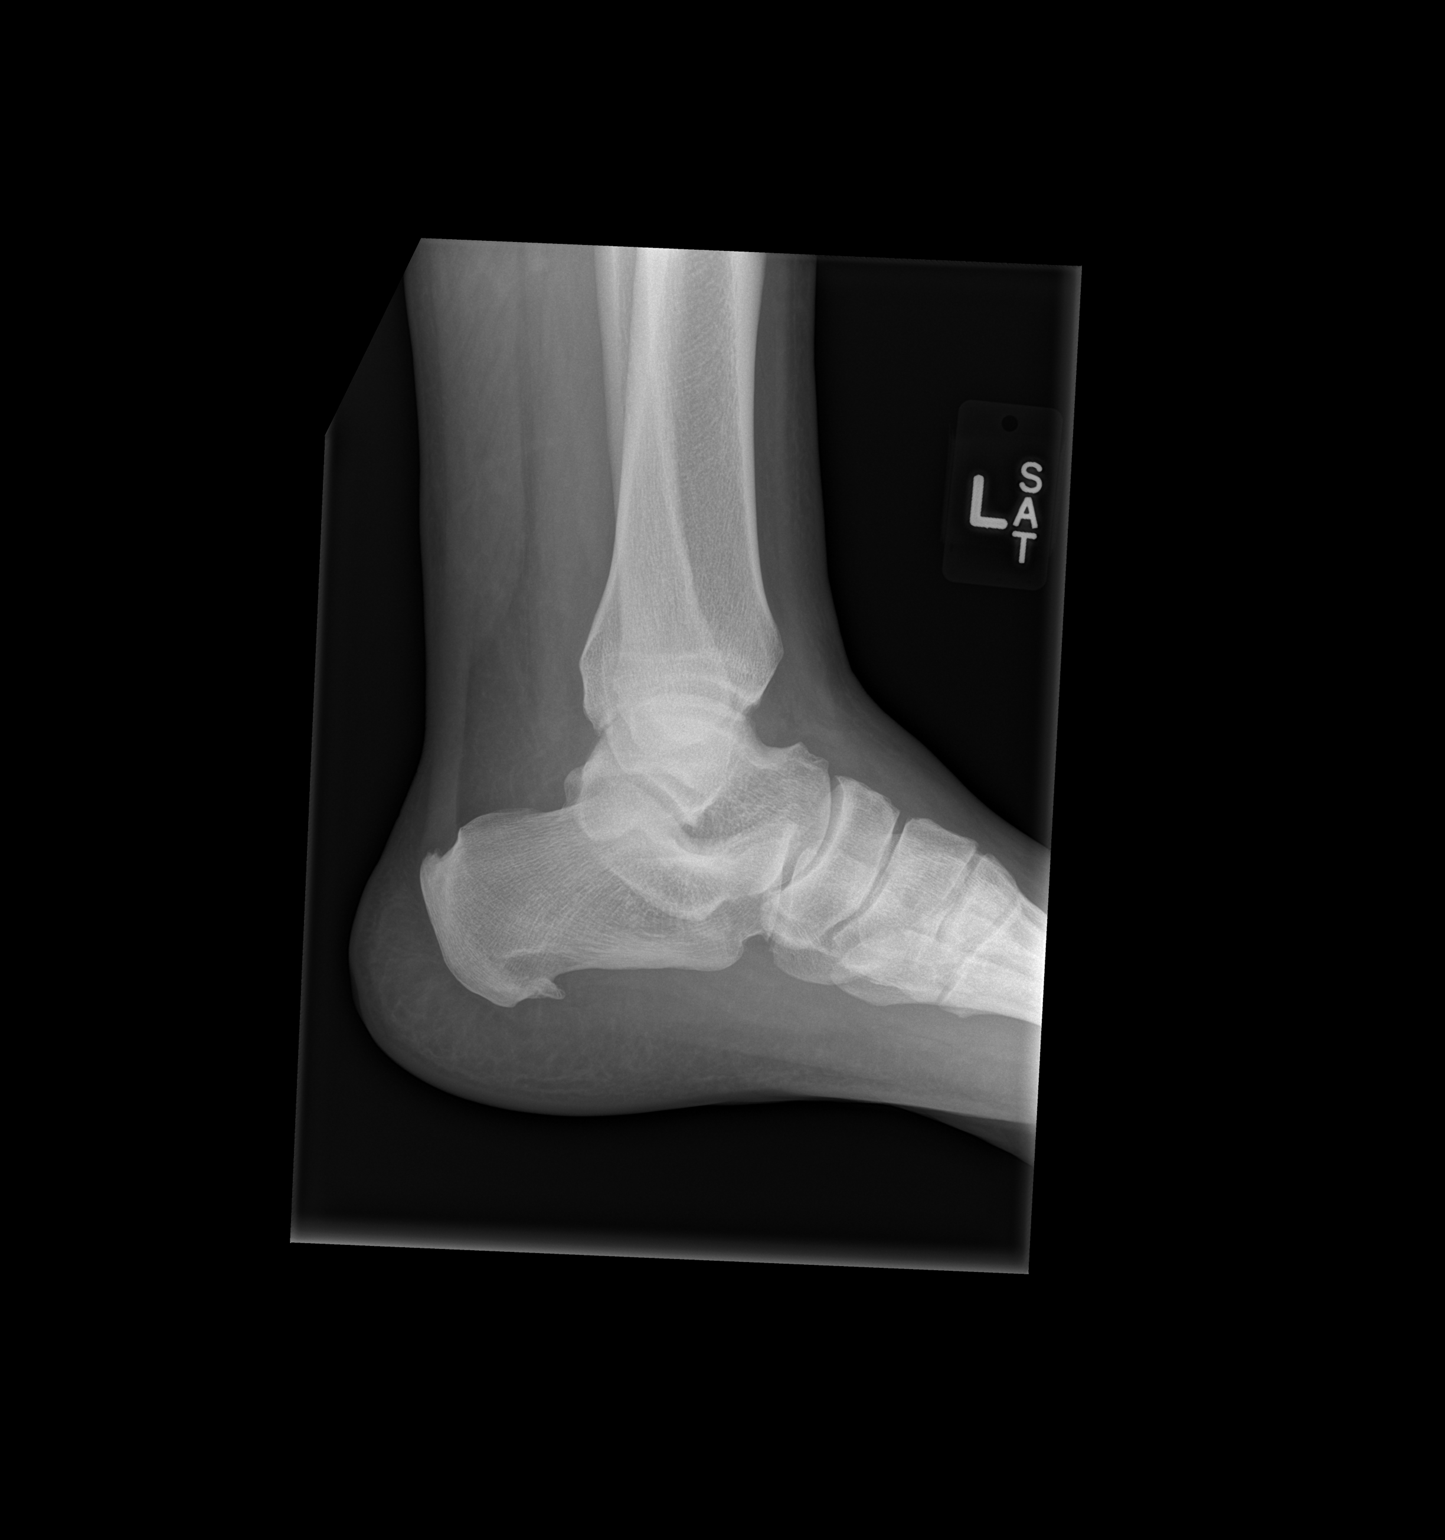

[x ankle ap left]
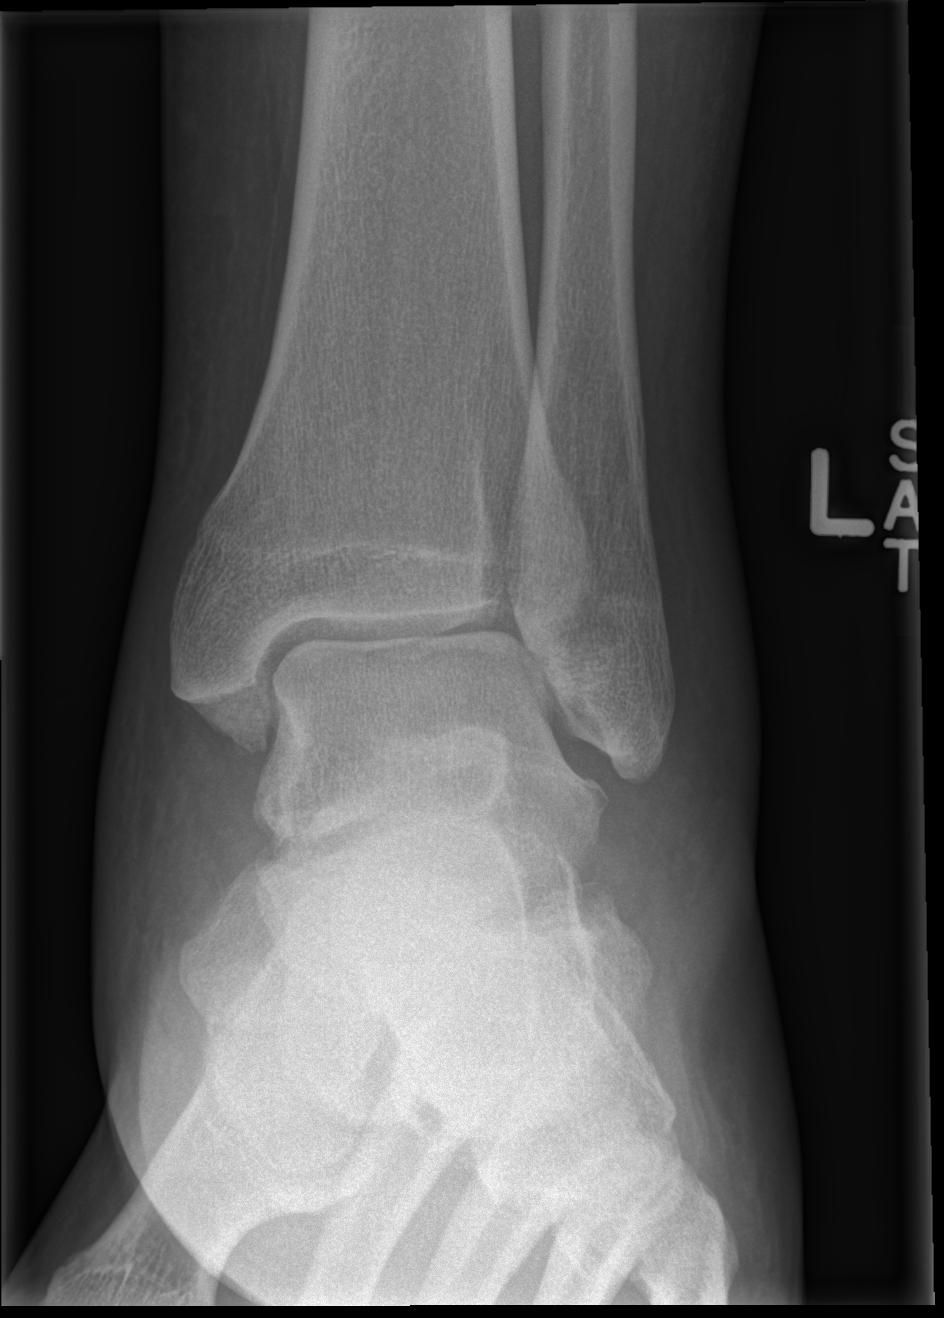

[x ankle obl left]
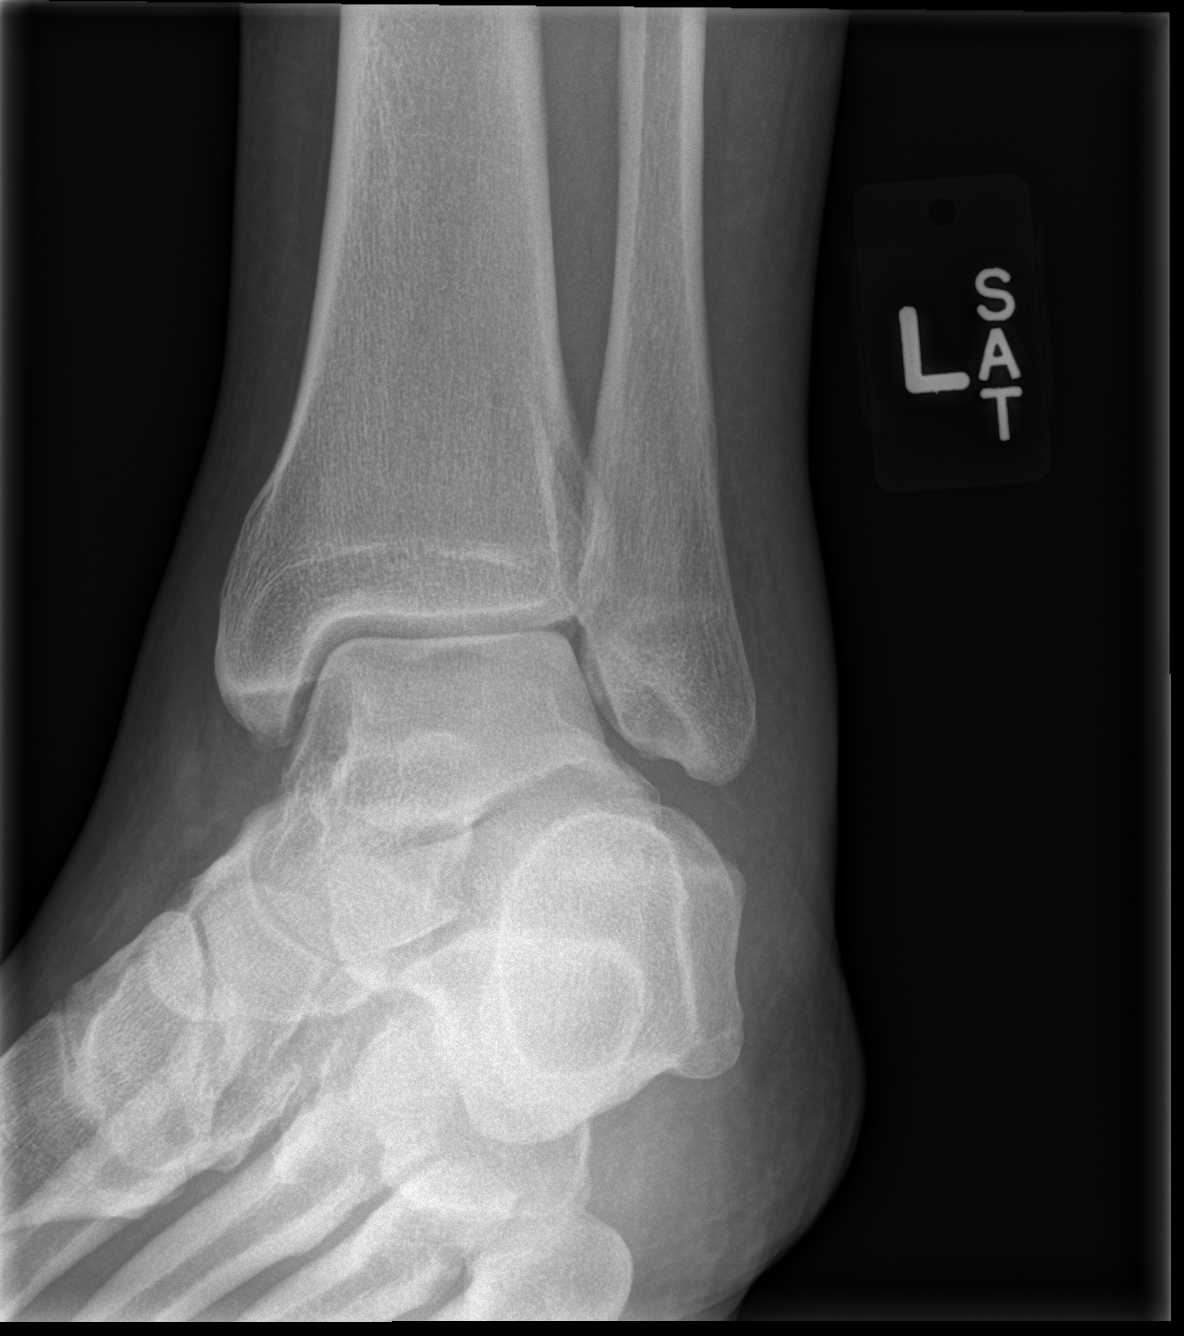

[3 of 3 positions shown; findings below may reference images not displayed]

FINDINGS: Spurring at the posterior and inferior calcaneus. No acute fracture.
No dislocation. Soft tissue swelling about the ankle joint is noted.
IMPRESSION: No acute bony pathology.

## 2016-04-14 ENCOUNTER — Other Ambulatory Visit: Payer: Self-pay | Admitting: Family

## 2016-04-21 ENCOUNTER — Ambulatory Visit: Payer: Self-pay | Admitting: Nurse Practitioner

## 2016-04-21 ENCOUNTER — Ambulatory Visit (INDEPENDENT_AMBULATORY_CARE_PROVIDER_SITE_OTHER): Payer: 59 | Admitting: Emergency Medicine

## 2016-04-21 VITALS — BP 130/82 | HR 76 | Temp 97.9°F | Resp 16 | Ht 70.0 in | Wt 301.0 lb

## 2016-04-21 DIAGNOSIS — J019 Acute sinusitis, unspecified: Secondary | ICD-10-CM

## 2016-04-21 DIAGNOSIS — R51 Headache: Secondary | ICD-10-CM

## 2016-04-21 DIAGNOSIS — R0981 Nasal congestion: Secondary | ICD-10-CM

## 2016-04-21 DIAGNOSIS — R519 Headache, unspecified: Secondary | ICD-10-CM

## 2016-04-21 MED ORDER — PREDNISONE 20 MG PO TABS: 40.0000 mg | ORAL_TABLET | Freq: Every day | ORAL | 0 refills | Status: AC

## 2016-04-21 MED ORDER — HYDROCODONE-ACETAMINOPHEN 5-325 MG PO TABS: 1.0000 | ORAL_TABLET | Freq: Four times a day (QID) | ORAL | 0 refills | Status: DC | PRN

## 2016-04-21 MED ORDER — AMOXICILLIN-POT CLAVULANATE 875-125 MG PO TABS: 1.0000 | ORAL_TABLET | Freq: Two times a day (BID) | ORAL | 0 refills | Status: DC

## 2016-04-21 NOTE — Progress Notes (Signed)
Carlos Tapia 46 y.o.   Chief Complaint  Patient presents with  . Sinusitis    x today/ sinus pressure    HISTORY OF PRESENT ILLNESS: This is a 46 y.o. male complaining of sinus infection, pressure, and headache since early am today.  Sinusitis  This is a new problem. The current episode started today. The problem has been rapidly worsening since onset. There has been no fever. His pain is at a severity of 7/10. The pain is moderate. Associated symptoms include congestion, coughing, ear pain, headaches and sinus pressure. Pertinent negatives include no chills, diaphoresis, neck pain, shortness of breath, sneezing, sore throat or swollen glands. Past treatments include nothing.     Prior to Admission medications   Medication Sig Start Date End Date Taking? Authorizing Provider  traZODone (DESYREL) 50 MG tablet Take 1 tablet (50 mg total) by mouth at bedtime. Needs office visit for more refills 04/17/16  Yes Veryl Speak, FNP  phentermine 37.5 MG capsule TAKE ONE CAPSULE BY MOUTH ONCE DAILY IN THE MORNING Patient not taking: Reported on 04/21/2016 02/22/16   Veryl Speak, FNP    No Known Allergies  Patient Active Problem List   Diagnosis Date Noted  . Balanitis 09/06/2015  . Morbid obesity (HCC) 09/06/2015  . Acute sinus infection 05/19/2015  . Insomnia 08/19/2014  . Fatigue 08/19/2014  . Elevated systolic blood pressure 08/19/2014    Past Medical History:  Diagnosis Date  . Blood in stool   . Chicken pox   . GERD (gastroesophageal reflux disease)   . Migraines   . Shingles 2009, 2010    History reviewed. No pertinent surgical history.  Social History   Social History  . Marital status: Married    Spouse name: N/A  . Number of children: 2  . Years of education: 12   Occupational History  . Location manager    Social History Main Topics  . Smoking status: Current Every Day Smoker    Packs/day: 0.75    Years: 30.00    Types: Cigarettes  . Smokeless  tobacco: Never Used  . Alcohol use Yes     Comment: rarely  . Drug use: Yes    Frequency: 7.0 times per week    Types: Marijuana  . Sexual activity: No   Other Topics Concern  . Not on file   Social History Narrative   Fun: Video games   Denies religious beliefs effecting health care.     Family History  Problem Relation Age of Onset  . Adopted: Yes  . Heart disease Mother      Review of Systems  Constitutional: Negative for chills and diaphoresis.  HENT: Positive for congestion, ear pain, sinus pain and sinus pressure. Negative for ear discharge, nosebleeds, sneezing and sore throat.   Eyes: Negative for discharge and redness.  Respiratory: Positive for cough. Negative for shortness of breath.   Cardiovascular: Negative for chest pain and palpitations.  Gastrointestinal: Negative for abdominal pain, diarrhea, nausea and vomiting.  Genitourinary: Negative for dysuria and hematuria.  Musculoskeletal: Negative for myalgias and neck pain.  Skin: Negative for rash.  Neurological: Positive for headaches. Negative for dizziness, sensory change, speech change and focal weakness.  Endo/Heme/Allergies: Negative.   Psychiatric/Behavioral: Negative.   All other systems reviewed and are negative.  Vitals:   04/21/16 1714  BP: 130/82  Pulse: 76  Resp: 16  Temp: 97.9 F (36.6 C)     Physical Exam  Constitutional: He is oriented to  person, place, and time. He appears well-developed and well-nourished.  HENT:  Head: Normocephalic and atraumatic.  Right Ear: External ear normal.  Left Ear: External ear normal.  Nose: Mucosal edema present. Right sinus exhibits maxillary sinus tenderness. Left sinus exhibits maxillary sinus tenderness.  Mouth/Throat: Oropharynx is clear and moist.  Eyes: Conjunctivae and EOM are normal. Pupils are equal, round, and reactive to light.  Neck: Normal range of motion. Neck supple. No thyromegaly present.  Cardiovascular: Normal rate, regular  rhythm, normal heart sounds and intact distal pulses.   Pulmonary/Chest: Effort normal and breath sounds normal.  Abdominal: Soft. He exhibits no distension. There is no tenderness.  Musculoskeletal: Normal range of motion. He exhibits no edema or tenderness.  Lymphadenopathy:    He has no cervical adenopathy.  Neurological: He is alert and oriented to person, place, and time. He displays normal reflexes. No sensory deficit. He exhibits normal muscle tone.  Skin: Skin is warm and dry. Capillary refill takes less than 2 seconds.  Psychiatric: He has a normal mood and affect. His behavior is normal.  Vitals reviewed.    ASSESSMENT & PLAN: Casimiro NeedleMichael was seen today for sinusitis.  Diagnoses and all orders for this visit:  Acute non-recurrent sinusitis, unspecified location  Sinus congestion  Sinus headache  Other orders -     amoxicillin-clavulanate (AUGMENTIN) 875-125 MG tablet; Take 1 tablet by mouth 2 (two) times daily. -     predniSONE (DELTASONE) 20 MG tablet; Take 2 tablets (40 mg total) by mouth daily with breakfast. -     HYDROcodone-acetaminophen (NORCO) 5-325 MG tablet; Take 1 tablet by mouth every 6 (six) hours as needed for moderate pain.    Patient Instructions       IF you received an x-ray today, you will receive an invoice from [facility]. Please contact [facility] at [phone] with questions or concerns regarding your invoice.   IF you received labwork today, you will receive an invoice from [facility]. Please contact [facility] at [phone] with questions or concerns regarding your invoice.   Our billing staff will not be able to assist you with questions regarding bills from these companies.  You will be contacted with the lab results as soon as they are available. The fastest way to get your results is to activate your My Chart account. Instructions are located on the last page of this paperwork. If you have not heard from us regarding  the results in 2 weeks, please contact this office.      Sinusitis, Adult Sinusitis is soreness and inflammation of your sinuses. Sinuses are hollow spaces in the bones around your face. They are located:  Around your eyes.  In the middle of your forehead.  Behind your nose.  In your cheekbones. Your sinuses and nasal passages are lined with a stringy fluid (mucus). Mucus normally drains out of your sinuses. When your nasal tissues get inflamed or swollen, the mucus can get trapped or blocked so air cannot flow through your sinuses. This lets bacteria, viruses, and funguses grow, and that leads to infection. Follow these instructions at home: Medicines  Take, use, or apply over-the-counter and prescription medicines only as told by your doctor. These may include nasal sprays.  If you were prescribed an antibiotic medicine, take it as told by your doctor. Do not stop taking the antibiotic even if you start to feel better. Hydrate and Humidify  Drink enough water to keep your pee (urine) clear or pale yellow.  Use a  cool mist humidifier to keep the humidity level in your home above 50%.  Breathe in steam for 10-15 minutes, 3-4 times a day or as told by your doctor. You can do this in the bathroom while a hot shower is running.  Try not to spend time in cool or dry air. Rest  Rest as much as possible.  Sleep with your head raised (elevated).  Make sure to get enough sleep each night. General instructions  Put a warm, moist washcloth on your face 3-4 times a day or as told by your doctor. This will help with discomfort.  Wash your hands often with soap and water. If there is no soap and water, use hand sanitizer.  Do not smoke. Avoid being around people who are smoking (secondhand smoke).  Keep all follow-up visits as told by your doctor. This is important. Contact a doctor if:  You have a fever.  Your symptoms get worse.  Your symptoms do not get better within 10  days. Get help right away if:  You have a very bad headache.  You cannot stop throwing up (vomiting).  You have pain or swelling around your face or eyes.  You have trouble seeing.  You feel confused.  Your neck is stiff.  You have trouble breathing. This information is not intended to replace advice given to you by your health care provider. Make sure you discuss any questions you have with your health care provider. Document Released: 09/06/2007 Document Revised: 11/14/2015 Document Reviewed: 01/13/2015 Elsevier Interactive Patient Education  2017 Elsevier Inc.      Edwina Barth, MD [facility]

## 2016-04-21 NOTE — Patient Instructions (Addendum)
     IF you received an x-ray today, you will receive an invoice from [facility]. Please contact [facility] at [phone] with questions or concerns regarding your invoice.   IF you received labwork today, you will receive an invoice from [facility]. Please contact [facility] at [phone] with questions or concerns regarding your invoice.   Our billing staff will not be able to assist you with questions regarding bills from these companies.  You will be contacted with the lab results as soon as they are available. The fastest way to get your results is to activate your My Chart account. Instructions are located on the last page of this paperwork. If you have not heard from us regarding the results in 2 weeks, please contact this office.      Sinusitis, Adult Sinusitis is soreness and inflammation of your sinuses. Sinuses are hollow spaces in the bones around your face. They are located:  Around your eyes.  In the middle of your forehead.  Behind your nose.  In your cheekbones. Your sinuses and nasal passages are lined with a stringy fluid (mucus). Mucus normally drains out of your sinuses. When your nasal tissues get inflamed or swollen, the mucus can get trapped or blocked so air cannot flow through your sinuses. This lets bacteria, viruses, and funguses grow, and that leads to infection. Follow these instructions at home: Medicines   Take, use, or apply over-the-counter and prescription medicines only as told by your doctor. These may include nasal sprays.  If you were prescribed an antibiotic medicine, take it as told by your doctor. Do not stop taking the antibiotic even if you start to feel better. Hydrate and Humidify   Drink enough water to keep your pee (urine) clear or pale yellow.  Use a cool mist humidifier to keep the humidity level in your home above 50%.  Breathe in steam for 10-15 minutes, 3-4 times a day or as told by your doctor. You can do  this in the bathroom while a hot shower is running.  Try not to spend time in cool or dry air. Rest   Rest as much as possible.  Sleep with your head raised (elevated).  Make sure to get enough sleep each night. General instructions   Put a warm, moist washcloth on your face 3-4 times a day or as told by your doctor. This will help with discomfort.  Wash your hands often with soap and water. If there is no soap and water, use hand sanitizer.  Do not smoke. Avoid being around people who are smoking (secondhand smoke).  Keep all follow-up visits as told by your doctor. This is important. Contact a doctor if:  You have a fever.  Your symptoms get worse.  Your symptoms do not get better within 10 days. Get help right away if:  You have a very bad headache.  You cannot stop throwing up (vomiting).  You have pain or swelling around your face or eyes.  You have trouble seeing.  You feel confused.  Your neck is stiff.  You have trouble breathing. This information is not intended to replace advice given to you by your health care provider. Make sure you discuss any questions you have with your health care provider. Document Released: 09/06/2007 Document Revised: 11/14/2015 Document Reviewed: 01/13/2015 Elsevier Interactive Patient Education  2017 Elsevier Inc.  

## 2016-06-09 ENCOUNTER — Other Ambulatory Visit: Payer: Self-pay | Admitting: Family

## 2016-06-12 NOTE — Telephone Encounter (Signed)
Last refill was 04/17/16 

## 2016-07-05 ENCOUNTER — Other Ambulatory Visit: Payer: Self-pay | Admitting: Family

## 2016-07-20 ENCOUNTER — Ambulatory Visit (INDEPENDENT_AMBULATORY_CARE_PROVIDER_SITE_OTHER): Payer: 59 | Admitting: Physician Assistant

## 2016-07-20 ENCOUNTER — Encounter: Payer: Self-pay | Admitting: Emergency Medicine

## 2016-07-20 VITALS — BP 153/94 | HR 96 | Temp 98.1°F | Ht 69.5 in | Wt 312.0 lb

## 2016-07-20 DIAGNOSIS — J3489 Other specified disorders of nose and nasal sinuses: Secondary | ICD-10-CM | POA: Diagnosis not present

## 2016-07-20 DIAGNOSIS — J320 Chronic maxillary sinusitis: Secondary | ICD-10-CM | POA: Diagnosis not present

## 2016-07-20 MED ORDER — AMOXICILLIN-POT CLAVULANATE 875-125 MG PO TABS
1.0000 | ORAL_TABLET | Freq: Two times a day (BID) | ORAL | 0 refills | Status: DC
Start: 2016-07-20 — End: 2016-11-28

## 2016-07-20 NOTE — Patient Instructions (Addendum)
Thank you for coming in today. I hope you feel we met your needs.  Feel free to call [facility] if you have any questions or further requests.  Please consider signing up for MyChart if you do not already have it, as this is a great way to communicate with me.  Best,  Whitney McVey, PA-C  Sinusitis, Adult Sinusitis is soreness and inflammation of your sinuses. Sinuses are hollow spaces in the bones around your face. Your sinuses are located:  Around your eyes.  In the middle of your forehead.  Behind your nose.  In your cheekbones. Your sinuses and nasal passages are lined with a stringy fluid (mucus). Mucus normally drains out of your sinuses. When your nasal tissues become inflamed or swollen, the mucus can become trapped or blocked so air cannot flow through your sinuses. This allows bacteria, viruses, and funguses to grow, which leads to infection. Sinusitis can develop quickly and last for 7?10 days (acute) or for more than 12 weeks (chronic). Sinusitis often develops after a cold. What are the causes? This condition is caused by anything that creates swelling in the sinuses or stops mucus from draining, including:  Allergies.  Asthma.  Bacterial or viral infection.  Abnormally shaped bones between the nasal passages.  Nasal growths that contain mucus (nasal polyps).  Narrow sinus openings.  Pollutants, such as chemicals or irritants in the air.  A foreign object stuck in the nose.  A fungal infection. This is rare. What increases the risk? The following factors may make you more likely to develop this condition:  Having allergies or asthma.  Having had a recent cold or respiratory tract infection.  Having structural deformities or blockages in your nose or sinuses.  Having a weak immune system.  Doing a lot of swimming or diving.  Overusing nasal sprays.  Smoking. What are the signs or symptoms? The main symptoms of this condition are pain and a feeling of  pressure around the affected sinuses. Other symptoms include:  Upper toothache.  Earache.  Headache.  Bad breath.  Decreased sense of smell and taste.  A cough that may get worse at night.  Fatigue.  Fever.  Thick drainage from your nose. The drainage is often green and it may contain pus (purulent).  Stuffy nose or congestion.  Postnasal drip. This is when extra mucus collects in the throat or back of the nose.  Swelling and warmth over the affected sinuses.  Sore throat.  Sensitivity to light. How is this diagnosed? This condition is diagnosed based on symptoms, a medical history, and a physical exam. To find out if your condition is acute or chronic, your health care provider may:  Look in your nose for signs of nasal polyps.  Tap over the affected sinus to check for signs of infection.  View the inside of your sinuses using an imaging device that has a light attached (endoscope). If your health care provider suspects that you have chronic sinusitis, you may also:  Be tested for allergies.  Have a sample of mucus taken from your nose (nasal culture) and checked for bacteria.  Have a mucus sample examined to see if your sinusitis is related to an allergy. If your sinusitis does not respond to treatment and it lasts longer than 8 weeks, you may have an MRI or CT scan to check your sinuses. These scans also help to determine how severe your infection is. In rare cases, a bone biopsy may be done to rule out  more serious types of fungal sinus disease. How is this treated? Treatment for sinusitis depends on the cause and whether your condition is chronic or acute. If a virus is causing your sinusitis, your symptoms will go away on their own within 10 days. You may be given medicines to relieve your symptoms, including:  Topical nasal decongestants. They shrink swollen nasal passages and let mucus drain from your sinuses.  Antihistamines. These drugs block inflammation  that is triggered by allergies. This can help to ease swelling in your nose and sinuses.  Topical nasal corticosteroids. These are nasal sprays that ease inflammation and swelling in your nose and sinuses.  Nasal saline washes. These rinses can help to get rid of thick mucus in your nose. If your condition is caused by bacteria, you will be given an antibiotic medicine. If your condition is caused by a fungus, you will be given an antifungal medicine. Surgery may be needed to correct underlying conditions, such as narrow nasal passages. Surgery may also be needed to remove polyps. Follow these instructions at home: Medicines   Take, use, or apply over-the-counter and prescription medicines only as told by your health care provider. These may include nasal sprays.  If you were prescribed an antibiotic medicine, take it as told by your health care provider. Do not stop taking the antibiotic even if you start to feel better. Hydrate and Humidify   Drink enough water to keep your urine clear or pale yellow. Staying hydrated will help to thin your mucus.  Use a cool mist humidifier to keep the humidity level in your home above 50%.  Inhale steam for 10-15 minutes, 3-4 times a day or as told by your health care provider. You can do this in the bathroom while a hot shower is running.  Limit your exposure to cool or dry air. Rest   Rest as much as possible.  Sleep with your head raised (elevated).  Make sure to get enough sleep each night. General instructions   Apply a warm, moist washcloth to your face 3-4 times a day or as told by your health care provider. This will help with discomfort.  Wash your hands often with soap and water to reduce your exposure to viruses and other germs. If soap and water are not available, use hand sanitizer.  Do not smoke. Avoid being around people who are smoking (secondhand smoke).  Keep all follow-up visits as told by your health care provider. This is  important. Contact a health care provider if:  You have a fever.  Your symptoms get worse.  Your symptoms do not improve within 10 days. Get help right away if:  You have a severe headache.  You have persistent vomiting.  You have pain or swelling around your face or eyes.  You have vision problems.  You develop confusion.  Your neck is stiff.  You have trouble breathing. This information is not intended to replace advice given to you by your health care provider. Make sure you discuss any questions you have with your health care provider. Document Released: 03/20/2005 Document Revised: 11/14/2015 Document Reviewed: 01/13/2015 Elsevier Interactive Patient Education  2017 [employer].   IF you received an x-ray today, you will receive an invoice from [facility]. Please contact [facility] at [phone] with questions or concerns regarding your invoice.   IF you received labwork today, you will receive an invoice from [place] [facility]. Please contact [facility] at [phone] with questions or concerns regarding your invoice.  Our billing staff will not be able to assist you with questions regarding bills from these companies.  You will be contacted with the lab results as soon as they are available. The fastest way to get your results is to activate your My Chart account. Instructions are located on the last page of this paperwork. If you have not heard from [place] regarding the results in 2 weeks, please contact this office.

## 2016-07-20 NOTE — Progress Notes (Signed)
   Carlos Tapia  MRN: [id number] DOB: 04-Jul-1970  PCP: Jeanine Luz, FNP  Subjective:  Pt is a 46 year old male presents to clinic for sinus pressure x 5 days.   h/o seasonal allergies. He has been suffering from runny nose and congestion for a few weeks now. C/o worsening sinus pressure over the past few days. Endorses chills, headache, sinus pain.  He has tried otc sinus medication - not working.  Denies fever, chest pain, wheezing, shob, cough, n/v.   Review of Systems  Constitutional: Positive for chills. Negative for fatigue and fever.  HENT: Positive for congestion, rhinorrhea, sinus pain, sinus pressure and sneezing.   Respiratory: Negative for chest tightness, shortness of breath and wheezing.   Cardiovascular: Negative for chest pain and palpitations.    Patient Active Problem List   Diagnosis Date Noted  . Morbid obesity (HCC) 09/06/2015  . Acute sinus infection 05/19/2015  . Insomnia 08/19/2014  . Fatigue 08/19/2014  . Elevated systolic blood pressure 08/19/2014   Medications reviewed with patient.   No Known Allergies   Objective:  BP (!) 153/94 (BP Location: Right Arm, Patient Position: Sitting, Cuff Size: Large)   Pulse 96   Temp 98.1 F (36.7 C) (Oral)   Ht 5' 9.5" (1.765 m)   Wt (!) 312 lb (141.5 kg)   SpO2 97%   BMI 45.41 kg/m   Physical Exam  Constitutional: He is oriented to person, place, and time and well-developed, well-nourished, and in no distress. No distress.  HENT:  Right Ear: Tympanic membrane is injected and bulging.  Left Ear: Tympanic membrane normal.  Nose: Mucosal edema present. No rhinorrhea. Right sinus exhibits maxillary sinus tenderness. Right sinus exhibits no frontal sinus tenderness. Left sinus exhibits maxillary sinus tenderness. Left sinus exhibits no frontal sinus tenderness.  Mouth/Throat: Mucous membranes are normal. Posterior oropharyngeal edema present. No oropharyngeal exudate or posterior oropharyngeal erythema.    Cardiovascular: Normal rate, regular rhythm and normal heart sounds.   Pulmonary/Chest: Effort normal and breath sounds normal.  Neurological: He is alert and oriented to person, place, and time. GCS score is 15.  Skin: Skin is warm and dry.  Psychiatric: Mood, memory, affect and judgment normal.  Vitals reviewed.   Assessment and Plan :  1. Maxillary sinusitis, unspecified chronicity 2. Sinus pressure - amoxicillin-clavulanate (AUGMENTIN) 875-125 MG tablet; Take 1 tablet by mouth 2 (two) times daily.  Dispense: 20 tablet; Refill: 0 - Encouraged pt to rest and push fluids. Advised changing allergy medications, start using flonase. RTC if no improvement.   Marco Collie, PA-C  [facility] at [facility] 07/20/2016 11:22 AM

## 2016-11-27 ENCOUNTER — Telehealth: Payer: Self-pay | Admitting: Internal Medicine

## 2016-11-27 NOTE — Telephone Encounter (Signed)
Yes, ok with me 

## 2016-11-27 NOTE — Telephone Encounter (Signed)
Pt would like to transfer care from [facility] to Dr. Yetta Barre. Would this be okay with each of you?

## 2016-11-27 NOTE — Telephone Encounter (Signed)
Informed pt's wife. She said that she would like him know and he would schedule with Dr Yetta Barre to establish care.

## 2016-11-27 NOTE — Telephone Encounter (Signed)
Ok with me 

## 2016-11-28 ENCOUNTER — Encounter: Payer: Self-pay | Admitting: Internal Medicine

## 2016-11-28 ENCOUNTER — Ambulatory Visit (INDEPENDENT_AMBULATORY_CARE_PROVIDER_SITE_OTHER): Payer: 59 | Admitting: Internal Medicine

## 2016-11-28 VITALS — BP 128/86 | HR 80 | Temp 98.3°F | Ht 69.5 in | Wt 318.0 lb

## 2016-11-28 DIAGNOSIS — G4710 Hypersomnia, unspecified: Secondary | ICD-10-CM | POA: Diagnosis not present

## 2016-11-28 DIAGNOSIS — R5383 Other fatigue: Secondary | ICD-10-CM

## 2016-11-28 DIAGNOSIS — E785 Hyperlipidemia, unspecified: Secondary | ICD-10-CM

## 2016-11-28 DIAGNOSIS — K921 Melena: Secondary | ICD-10-CM

## 2016-11-28 NOTE — Patient Instructions (Signed)
Please continue all other medications as before, and refills have been done if requested.  Please have the pharmacy call with any other refills you may need.  Please keep your appointments with your specialists as you may have planned  You will be contacted regarding the referral for: Gastroenterology, and Pulmonary  Please go to the LAB in the Basement (turn left off the elevator) for the tests to be done tomorrow  You will be contacted by phone if any changes need to be made immediately.  Otherwise, you will receive a letter about your results with an explanation, but please check with MyChart first.  Please remember to sign up for MyChart if you have not done so, as this will be important to you in the future with finding out test results, communicating by private email, and scheduling acute appointments online when needed.

## 2016-11-29 ENCOUNTER — Other Ambulatory Visit (INDEPENDENT_AMBULATORY_CARE_PROVIDER_SITE_OTHER): Payer: 59

## 2016-11-29 DIAGNOSIS — K921 Melena: Secondary | ICD-10-CM

## 2016-11-29 DIAGNOSIS — E785 Hyperlipidemia, unspecified: Secondary | ICD-10-CM

## 2016-11-29 DIAGNOSIS — R5383 Other fatigue: Secondary | ICD-10-CM

## 2016-11-29 DIAGNOSIS — G4710 Hypersomnia, unspecified: Secondary | ICD-10-CM | POA: Diagnosis not present

## 2016-11-29 LAB — URINALYSIS, ROUTINE W REFLEX MICROSCOPIC
BILIRUBIN URINE: NEGATIVE
KETONES UR: NEGATIVE
LEUKOCYTES UA: NEGATIVE
Nitrite: NEGATIVE
PH: 7 (ref 5.0–8.0)
Specific Gravity, Urine: 1.01 (ref 1.000–1.030)
TOTAL PROTEIN, URINE-UPE24: NEGATIVE
UROBILINOGEN UA: 0.2 (ref 0.0–1.0)
Urine Glucose: NEGATIVE

## 2016-11-29 LAB — CBC WITH DIFFERENTIAL/PLATELET
BASOS ABS: 0.1 10*3/uL (ref 0.0–0.1)
Basophils Relative: 0.9 % (ref 0.0–3.0)
EOS ABS: 0.4 10*3/uL (ref 0.0–0.7)
Eosinophils Relative: 3.4 % (ref 0.0–5.0)
HCT: 45.5 % (ref 39.0–52.0)
Hemoglobin: 15.1 g/dL (ref 13.0–17.0)
LYMPHS ABS: 3 10*3/uL (ref 0.7–4.0)
LYMPHS PCT: 24.2 % (ref 12.0–46.0)
MCHC: 33.2 g/dL (ref 30.0–36.0)
MCV: 91.8 fl (ref 78.0–100.0)
Monocytes Absolute: 0.9 10*3/uL (ref 0.1–1.0)
Monocytes Relative: 7.3 % (ref 3.0–12.0)
NEUTROS PCT: 64.2 % (ref 43.0–77.0)
Neutro Abs: 7.8 10*3/uL — ABNORMAL HIGH (ref 1.4–7.7)
PLATELETS: 262 10*3/uL (ref 150.0–400.0)
RBC: 4.96 Mil/uL (ref 4.22–5.81)
RDW: 13.9 % (ref 11.5–15.5)
WBC: 12.2 10*3/uL — ABNORMAL HIGH (ref 4.0–10.5)

## 2016-11-29 LAB — HEPATIC FUNCTION PANEL
ALK PHOS: 73 U/L (ref 39–117)
ALT: 32 U/L (ref 0–53)
AST: 25 U/L (ref 0–37)
Albumin: 4.6 g/dL (ref 3.5–5.2)
Bilirubin, Direct: 0.1 mg/dL (ref 0.0–0.3)
TOTAL PROTEIN: 7.4 g/dL (ref 6.0–8.3)
Total Bilirubin: 0.5 mg/dL (ref 0.2–1.2)

## 2016-11-29 LAB — TESTOSTERONE: Testosterone: 165.43 ng/dL — ABNORMAL LOW (ref 300.00–890.00)

## 2016-11-29 LAB — BASIC METABOLIC PANEL
BUN: 16 mg/dL (ref 6–23)
CHLORIDE: 103 meq/L (ref 96–112)
CO2: 27 meq/L (ref 19–32)
CREATININE: 0.91 mg/dL (ref 0.40–1.50)
Calcium: 9.5 mg/dL (ref 8.4–10.5)
GFR: 96.09 mL/min (ref >60.00)
Glucose, Bld: 104 mg/dL — ABNORMAL HIGH (ref 70–99)
Potassium: 3.8 mEq/L (ref 3.5–5.1)
Sodium: 137 mEq/L (ref 135–145)

## 2016-11-29 LAB — LIPID PANEL
CHOL/HDL RATIO: 5
Cholesterol: 182 mg/dL (ref 0–200)
HDL: 35.5 mg/dL — AB (ref >39.00)
LDL CALC: 125 mg/dL — AB (ref 0–99)
NONHDL: 146.87
TRIGLYCERIDES: 109 mg/dL (ref 0.0–149.0)
VLDL: 21.8 mg/dL (ref 0.0–40.0)

## 2016-11-29 LAB — TSH: TSH: 1.12 u[IU]/mL (ref 0.35–4.50)

## 2016-11-29 NOTE — Assessment & Plan Note (Addendum)
Etiology unclear, also for GI referral, may need colonoscopy, for labs as ordered

## 2016-11-29 NOTE — Assessment & Plan Note (Signed)
For pulm referral as above,  to f/u any worsening symptoms or concerns, cont to work on wt loss and diet

## 2016-11-29 NOTE — Progress Notes (Signed)
   Subjective:    Patient ID: Carlos Tapia, male    DOB: 1971/03/02, 46 y.o.   MRN: [id number]  HPI  Here to f/u; has hx of chronic constipation for at least 2 yrs and s/p hemorrhoid surgury; now has 2 wks onset generalized abd pain, mild, without worsening reflux, dysphagia, n/v, or diarrhea, but assoc with 3 separate episodes BRBPR found as commode filled each time, not sure how much but not large amount of blood.   Pt denies fever, wt loss, night sweats, loss of appetite, or other constitutional symptoms  Has hx of similar GI symptoms 1-2 times in past but no blood.  No recent travel, med change.  Does c/o ongoing fatigue, and does c/o some at least mild daytime somnolence.  Was able to quit smoking dec /2017 but unfortunately has gained about 50 lbs since then.   Past Medical History:  Diagnosis Date  . Blood in stool   . Chicken pox   . GERD (gastroesophageal reflux disease)   . Migraines   . Shingles 2009, 2010   No past surgical history on file.  reports that he quit smoking about 8 months ago. His smoking use included Cigarettes. He has a 22.50 pack-year smoking history. He has never used smokeless tobacco. He reports that he drinks alcohol. He reports that he uses drugs, including Marijuana, about 7 times per week. family history includes Heart disease in his mother. He was adopted. No Known Allergies  Review of Systems  Constitutional: Negative for other unusual diaphoresis or sweats HENT: Negative for ear discharge or swelling Eyes: Negative for other worsening visual disturbances Respiratory: Negative for stridor or other swelling  Gastrointestinal: Negative for worsening distension or other blood Genitourinary: Negative for retention or other urinary change Musculoskeletal: Negative for other MSK pain or swelling Skin: Negative for color change or other new lesions Neurological: Negative for worsening tremors and other numbness  Psychiatric/Behavioral: Negative for worsening  agitation or other fatigue All other system neg per pt    Objective:   Physical Exam BP 128/86   Pulse 80   Temp 98.3 F (36.8 C) (Oral)   Ht 5' 9.5" (1.765 m)   Wt (!) 318 lb (144.2 kg)   SpO2 99%   BMI 46.29 kg/m  VS noted,  Constitutional: Pt appears in NAD HENT: Head: NCAT.  Right Ear: External ear normal.  Left Ear: External ear normal.  Eyes: . Pupils are equal, round, and reactive to light. Conjunctivae and EOM are normal Nose: without d/c or deformity Neck: Neck supple. Gross normal ROM Cardiovascular: Normal rate and regular rhythm.   Pulmonary/Chest: Effort normal and breath sounds without rales or wheezing.  Abd:  Soft, ND, + BS, no organomegaly with diffuse mild tender, no guarding or rebound Neurological: Pt is alert. At baseline orientation, motor grossly intact Skin: Skin is warm. No rashes, other new lesions, no LE edema Psychiatric: Pt behavior is normal without agitation  No other exam findings     Assessment & Plan:

## 2016-11-29 NOTE — Assessment & Plan Note (Signed)
Etiology unclear, for testosterone leve, also refer pulmonary for possible OSA

## 2016-11-30 ENCOUNTER — Ambulatory Visit (INDEPENDENT_AMBULATORY_CARE_PROVIDER_SITE_OTHER): Payer: 59 | Admitting: Physician Assistant

## 2016-11-30 ENCOUNTER — Encounter: Payer: Self-pay | Admitting: Internal Medicine

## 2016-11-30 ENCOUNTER — Encounter: Payer: Self-pay | Admitting: Physician Assistant

## 2016-11-30 VITALS — BP 124/78 | HR 71 | Ht 69.5 in | Wt 318.0 lb

## 2016-11-30 DIAGNOSIS — K5909 Other constipation: Secondary | ICD-10-CM | POA: Diagnosis not present

## 2016-11-30 DIAGNOSIS — R1032 Left lower quadrant pain: Secondary | ICD-10-CM

## 2016-11-30 DIAGNOSIS — K625 Hemorrhage of anus and rectum: Secondary | ICD-10-CM | POA: Diagnosis not present

## 2016-11-30 MED ORDER — NA SULFATE-K SULFATE-MG SULF 17.5-3.13-1.6 GM/177ML PO SOLN
ORAL | 0 refills | Status: DC
Start: 2016-11-30 — End: 2016-12-28

## 2016-11-30 NOTE — Progress Notes (Signed)
Subjective:    Patient ID: Carlos Tapia, male    DOB: 1970/08/04, 46 y.o.   MRN: [id number]  HPI Franke is a pleasant 46 year old white male, new to GI today referred by Dr. Oliver Barre for evaluation of constipation, change in bowel habits and rectal bleeding. Patient is also had recent complaints of fatigue. He has history of hypertension and morbid obesity He has not had any prior GI evaluation. Patient relates that he has had problems with constipation over the past 5 years. He says his stomach is always "torn up" with intermittent somewhat migratory abdominal pains, churning and an unsettled feeling. He says he generally has some bowel movement every day but is only able to pass a very small amount of stool with a lot of straining. Is not been taking laxatives on a regular basis but may take a dose of milk of magnesia once a week. He does have history of external and internal hemorrhoids and had hemorrhoidectomy of both internal and external hemorrhoids by his report about 12 years ago when living in [place]. Patient has had 4 separate episodes in the past couple of weeks of seeing bright red blood in the commode, enough to turn the water red associated with his bowel movements. He feels that there is some blood mixed in with his stools.He has not had any specific rectal pain or pressure. Appetite has been fine, weight stable, he is trying to lose weight but finds it very difficult. He has had some vague nausea intermittently. Family history uncertain, patient adopted. Labs reviewed from earlier this week, WBC of 12.2 hemoglobin 15 hematocrit of 45.5 MCV of 91.8, liver function studies normal, testosterone level was low. TSH within normal limits.  Review of Systems Pertinent positive and negative review of systems were noted in the above HPI section.  All other review of systems was otherwise negative.   No Known Allergies Patient Active Problem List   Diagnosis Date Noted  .  Hypersomnolence 11/28/2016  . Hematochezia 11/28/2016  . Morbid obesity (HCC) 09/06/2015  . Acute sinus infection 05/19/2015  . Insomnia 08/19/2014  . Fatigue 08/19/2014  . Elevated systolic blood pressure 08/19/2014   Social History   Social History  . Marital status: Married    Spouse name: N/A  . Number of children: 2  . Years of education: 12   Occupational History  . Location manager    Social History Main Topics  . Smoking status: Former Smoker    Packs/day: 0.75    Years: 30.00    Types: Cigarettes    Quit date: 03/30/2016  . Smokeless tobacco: Never Used  . Alcohol use Yes     Comment: rarely  . Drug use: Yes    Frequency: 7.0 times per week    Types: Marijuana  . Sexual activity: No   Other Topics Concern  . Not on file   Social History Narrative   Fun: Video games   Denies religious beliefs effecting health care.     Mr. Hyland family history includes Heart disease in his mother. He was adopted.      Objective:    Vitals:   11/30/16 0956  BP: 124/78  Pulse: 71    Physical Exam  well-developed white male in no acute distress, accompanied by his wife, both pleasant blood pressure 124/78 pulse 71, height 5 foot 9, weight 318, BMI 46.2. HEENT ;nontraumatic normocephalic EOMI PERRLA sclera anicteric, Cardiovascular ;regular rate and rhythm with S1-S2 no murmur or gallop,  Pulmonary clear bilaterally, Abdomen; obese soft bowel sounds are present there is no palpable mass or hepatosplenomegaly, he does have some mild tenderness in the left lower quadrant and suprapubic area, Rectal ;exam not done,, Extremities; no clubbing cyanosis or edema skin warm dry, Neuropsych ;mood and affect appropriate       Assessment & Plan:   #611 46 year old white male with chronic constipation 5 years with some progression, now with 4 episodes of hematochezia with bright red blood in the commode and mixed with stool per patient. Patient also complains of difficulty  evacuating bowels, and generalized intermittent abdominal discomfort Rule out occult colon lesion, rule out IBD. Rule out chronic constipation with bleeding secondary to internal hemorrhoids  #2 status post remote hemorrhoidectomy internal and external #3 morbid obesity #4 hypertension  Plan; Patient will be scheduled for colonoscopy with Dr. Lavon PaganiniNandigam. Procedure discussed in detail with patient including risks and benefits and he is agreeable to proceed. Start MiraLAX 17 g in 8 ounces of water every day area High-fiber diet with liberal water intake.  Amy S Esterwood PA-C 11/30/2016   Cc: Corwin LevinsJohn, James W, MD

## 2016-11-30 NOTE — Patient Instructions (Addendum)
You have been scheduled for a colonoscopy. Please follow written instructions given to you at your visit today.  Please pick up your prep supplies at the pharmacy within the next 1-3 days.[facility] [address]   If you use inhalers (even only as needed), please bring them with you on the day of your procedure. Your physician has requested that you go to www.startemmi.com and enter the access code given to you at your visit today. This web site gives a general overview about your procedure. However, you should still follow specific instructions given to you by our office regarding your preparation for the procedure.  Take Miralax 17 grams in 8 oz of water daily.

## 2016-12-01 NOTE — Progress Notes (Signed)
Reviewed and agree with documentation and assessment and plan. K. Veena Eschol Auxier , MD   

## 2016-12-05 ENCOUNTER — Telehealth: Payer: Self-pay | Admitting: Family

## 2016-12-05 NOTE — Telephone Encounter (Signed)
Pt would like to concern treatment for test , what is the next step.  What does he need to do?    Best number [phone] wife, ok to talk to her

## 2016-12-05 NOTE — Telephone Encounter (Signed)
I cant understand what question is being asked, and what problem is being referred to

## 2016-12-06 MED ORDER — TESTOSTERONE 50 MG/5GM (1%) TD GEL: 5.0000 g | Freq: Every day | TRANSDERMAL | 5 refills | Status: DC

## 2016-12-06 NOTE — Telephone Encounter (Signed)
Pt wife called and said that pt is interested in testosterone  Treatment.  He would like to start that.  What is his next step to start that?    Sorry about that

## 2016-12-06 NOTE — Telephone Encounter (Signed)
androgel Done hardcopy to [employer]

## 2016-12-06 NOTE — Telephone Encounter (Signed)
Pt informed Script at front desk 

## 2016-12-08 ENCOUNTER — Telehealth: Payer: Self-pay | Admitting: Emergency Medicine

## 2016-12-11 ENCOUNTER — Telehealth: Payer: Self-pay | Admitting: Family

## 2016-12-11 NOTE — Telephone Encounter (Signed)
Androderm or Testim gel,   She called their ins compy and they will cover these 2 with a PA.  She would like one of these sent in and a PA started

## 2016-12-11 NOTE — Telephone Encounter (Signed)
Dr. Jonny RuizJohn which med would you like me to start a PA on? Please advise.

## 2016-12-11 NOTE — Telephone Encounter (Signed)
Pt called wife called in and said that ins will not cover the testosterone (ANDROGEL) 50 MG/5GM (1%) GEL [[id number]][[id number]]     She is going to call ins to see what they will cover and call back

## 2016-12-13 MED ORDER — TESTOSTERONE 50 MG/5GM (1%) TD GEL: 5.0000 g | Freq: Every day | TRANSDERMAL | 1 refills | Status: DC

## 2016-12-13 NOTE — Telephone Encounter (Signed)
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

## 2016-12-13 NOTE — Telephone Encounter (Signed)
faxed

## 2016-12-14 NOTE — Telephone Encounter (Signed)
Key [id number]  Per [employer] PA can not be performed because the requested mediation is a plan exclusion for this member.   Determination sent to scan.

## 2016-12-21 ENCOUNTER — Encounter: Payer: Self-pay | Admitting: Internal Medicine

## 2016-12-22 ENCOUNTER — Telehealth: Payer: Self-pay

## 2016-12-22 MED ORDER — TESTOSTERONE 50 MG/5GM (1%) TD GEL: 5.0000 g | Freq: Every day | TRANSDERMAL | 1 refills | Status: DC

## 2016-12-22 NOTE — Telephone Encounter (Signed)
Denied. Per [facility] patient did not meet all requirements to have medication covered.   Determination has been sent to scan.

## 2016-12-22 NOTE — Telephone Encounter (Signed)
[facility] to see above 

## 2016-12-25 ENCOUNTER — Telehealth: Payer: Self-pay

## 2016-12-25 NOTE — Telephone Encounter (Signed)
Denied. [employer] [facility] stated requested product is a plan exclusion for this member.   Determination sent to scan

## 2016-12-28 ENCOUNTER — Other Ambulatory Visit: Payer: Self-pay

## 2016-12-28 MED ORDER — NA SULFATE-K SULFATE-MG SULF 17.5-3.13-1.6 GM/177ML PO SOLN: ORAL | 0 refills | Status: DC

## 2016-12-28 NOTE — Telephone Encounter (Signed)
Wife came in to office and talked to insurance company on how the med can be approved,  Needs 2 T levels with 2 different dates  The Testosterone was only checked once can we recheck his testosterone for the appeal?  On the PA the diagnosis needs to be for obesity and decreased Libido.  On the paperwork where is says is the patient continuing test therapy it needs to say no Because its a new diagnosis.   Please advise and call wife back

## 2016-12-28 NOTE — Telephone Encounter (Addendum)
I have added another testosterone level lab to be checked for the appeal. I will have patient come by at his earliest convenience to complete and sign the release included in the appeal letter.   Pt has been notified.

## 2016-12-28 NOTE — Addendum Note (Signed)
Addended by: Roney Mans on: 12/28/2016 06:20 PM   Modules accepted: Orders

## 2016-12-29 ENCOUNTER — Encounter: Payer: Self-pay | Admitting: Gastroenterology

## 2017-01-01 ENCOUNTER — Other Ambulatory Visit (INDEPENDENT_AMBULATORY_CARE_PROVIDER_SITE_OTHER): Payer: 59

## 2017-01-01 LAB — TESTOSTERONE: Testosterone: 242.96 ng/dL — ABNORMAL LOW (ref 300.00–890.00)

## 2017-01-01 NOTE — Telephone Encounter (Signed)
error 

## 2017-01-05 ENCOUNTER — Encounter: Payer: Self-pay | Admitting: Gastroenterology

## 2017-01-05 ENCOUNTER — Ambulatory Visit (AMBULATORY_SURGERY_CENTER): Payer: 59 | Admitting: Gastroenterology

## 2017-01-05 VITALS — BP 130/79 | HR 86 | Temp 98.4°F | Resp 21 | Ht 69.5 in | Wt 318.0 lb

## 2017-01-05 DIAGNOSIS — K625 Hemorrhage of anus and rectum: Secondary | ICD-10-CM

## 2017-01-05 DIAGNOSIS — K649 Unspecified hemorrhoids: Secondary | ICD-10-CM

## 2017-01-05 HISTORY — PX: COLONOSCOPY WITH PROPOFOL: SHX5780

## 2017-01-05 MED ORDER — SODIUM CHLORIDE 0.9 % IV SOLN: 500.0000 mL | INTRAVENOUS | Status: DC

## 2017-01-05 NOTE — Progress Notes (Signed)
To PACU VSS. Report to RN.tb 

## 2017-01-05 NOTE — Op Note (Signed)
[facility] Patient Name: Carlos Tapia Procedure Date: 01/05/2017 2:44 PM MRN: [id number] Endoscopist: Napoleon Form , MD Age: 46 Referring MD:  Date of Birth: October 08, 1970 Gender: Male Account #: [id number] Procedure:                Colonoscopy Indications:              Evaluation of unexplained GI bleeding Medicines:                Monitored Anesthesia Care Procedure:                Pre-Anesthesia Assessment:                           - Prior to the procedure, a History and Physical                            was performed, and patient medications and                            allergies were reviewed. The patient's tolerance of                            previous anesthesia was also reviewed. The risks                            and benefits of the procedure and the sedation                            options and risks were discussed with the patient.                            All questions were answered, and informed consent                            was obtained. Prior Anticoagulants: The patient has                            taken no previous anticoagulant or antiplatelet                            agents. ASA Grade Assessment: II - A patient with                            mild systemic disease. After reviewing the risks                            and benefits, the patient was deemed in                            satisfactory condition to undergo the procedure.                           After obtaining informed consent, the colonoscope  was passed under direct vision. Throughout the                            procedure, the patient's blood pressure, pulse, and                            oxygen saturations were monitored continuously. The                            Colonoscope was introduced through the anus and                            advanced to the the cecum, identified by                            appendiceal orifice and  ileocecal valve. The                            colonoscopy was performed without difficulty. The                            patient tolerated the procedure well. The quality                            of the bowel preparation was adequate. The                            ileocecal valve, appendiceal orifice, and rectum                            were photographed. Scope In: 2:51:02 PM Scope Out: 3:13:06 PM Scope Withdrawal Time: 0 hours 11 minutes 25 seconds  Total Procedure Duration: 0 hours 22 minutes 4 seconds  Findings:                 The perianal and digital rectal examinations were                            normal.                           Non-bleeding internal hemorrhoids were found during                            retroflexion. The hemorrhoids were medium-sized. Complications:            No immediate complications. Estimated Blood Loss:     Estimated blood loss: none. Impression:               - Non-bleeding internal hemorrhoids.                           - No specimens collected. Recommendation:           - Patient has a contact number available for  emergencies. The signs and symptoms of potential                            delayed complications were discussed with the                            patient. Return to normal activities tomorrow.                            Written discharge instructions were provided to the                            patient.                           - Resume previous diet.                           - Continue present medications.                           - Repeat colonoscopy at age 56 for screening                            purposes.                           - Anusol suppository per rectum at bedtime as needed                           - Benefiber 1 tablespoon TID with meals Napoleon Form, MD 01/05/2017 3:19:44 PM This report has been signed electronically.

## 2017-01-05 NOTE — Patient Instructions (Signed)
YOU HAD AN ENDOSCOPIC PROCEDURE TODAY AT THE  ENDOSCOPY CENTER:   Refer to the procedure report that was given to you for any specific questions about what was found during the examination.  If the procedure report does not answer your questions, please call your gastroenterologist to clarify.  If you requested that your care partner not be given the details of your procedure findings, then the procedure report has been included in a sealed envelope for you to review at your convenience later.  YOU SHOULD EXPECT: Some feelings of bloating in the abdomen. Passage of more gas than usual.  Walking can help get rid of the air that was put into your GI tract during the procedure and reduce the bloating. If you had a lower endoscopy (such as a colonoscopy or flexible sigmoidoscopy) you may notice spotting of blood in your stool or on the toilet paper. If you underwent a bowel prep for your procedure, you may not have a normal bowel movement for a few days.  Please Note:  You might notice some irritation and congestion in your nose or some drainage.  This is from the oxygen used during your procedure.  There is no need for concern and it should clear up in a day or so.  SYMPTOMS TO REPORT IMMEDIATELY:   Following lower endoscopy (colonoscopy or flexible sigmoidoscopy):  Excessive amounts of blood in the stool  Significant tenderness or worsening of abdominal pains  Swelling of the abdomen that is new, acute  Fever of 100F or higher   Following upper endoscopy (EGD)  Vomiting of blood or coffee ground material  New chest pain or pain under the shoulder blades  Painful or persistently difficult swallowing  New shortness of breath  Fever of 100F or higher  Black, tarry-looking stools  For urgent or emergent issues, a gastroenterologist can be reached at any hour by calling [phone].   DIET:  We do recommend a small meal at first, but then you may proceed to your regular diet.  Drink  plenty of fluids but you should avoid alcoholic beverages for 24 hours.  ACTIVITY:  You should plan to take it easy for the rest of today and you should NOT DRIVE or use heavy machinery until tomorrow (because of the sedation medicines used during the test).    FOLLOW UP: Our staff will call the number listed on your records the next business day following your procedure to check on you and address any questions or concerns that you may have regarding the information given to you following your procedure. If we do not reach you, we will leave a message.  However, if you are feeling well and you are not experiencing any problems, there is no need to return our call.  We will assume that you have returned to your regular daily activities without incident.  If any biopsies were taken you will be contacted by phone or by letter within the next 1-3 weeks.  Please call us at [phone] if you have not heard about the biopsies in 3 weeks.    SIGNATURES/CONFIDENTIALITY: You and/or your care partner have signed paperwork which will be entered into your electronic medical record.  These signatures attest to the fact that that the information above on your After Visit Summary has been reviewed and is understood.  Full responsibility of the confidentiality of this discharge information lies with you and/or your care-partner.  Repeat colonoscopy at 52 years.  Anusol suppository via rectum at  bedtime as needed  Benefiber 1 tablespoon three times a day with meals

## 2017-01-08 ENCOUNTER — Telehealth: Payer: Self-pay

## 2017-01-08 NOTE — Telephone Encounter (Signed)
  Follow up Call-  Call back number 01/05/2017  Post procedure Call Back phone  # [phone]  Permission to leave phone message Yes  Some recent data might be hidden     Patient questions:  Do you have a fever, pain , or abdominal swelling? No. Pain Score  0 *  Have you tolerated food without any problems? Yes.    Have you been able to return to your normal activities? Yes.    Do you have any questions about your discharge instructions: Diet   No. Medications  No. Follow up visit  No.  Do you have questions or concerns about your Care? No.  Actions: * If pain score is 4 or above: No action needed, pain <4.

## 2017-01-18 ENCOUNTER — Telehealth: Payer: Self-pay | Admitting: Family

## 2017-01-18 NOTE — Telephone Encounter (Signed)
Called pt, no VM is set up to leave a message.

## 2017-01-18 NOTE — Telephone Encounter (Signed)
Pt was suppose to come by office on 9/27 to sign paperwork for the PA and patient did not show.

## 2017-01-18 NOTE — Telephone Encounter (Signed)
States that a new PA needs to be processed on testosterone with new levels that that Dr. Jonny RuizJohn did on last ov.

## 2017-01-18 NOTE — Telephone Encounter (Signed)
Can you please follow up with patient in regard?

## 2017-01-31 ENCOUNTER — Ambulatory Visit: Payer: Self-pay | Admitting: Internal Medicine

## 2017-02-02 ENCOUNTER — Telehealth: Payer: Self-pay

## 2017-02-02 NOTE — Telephone Encounter (Signed)
Wife called back and has been informed. She will tell the patient.

## 2017-02-02 NOTE — Telephone Encounter (Signed)
Called pt's wife, to inform her that the PA for testim was done successfully over the phone and was approved through 08/02/2017.  VM is not set up for msgs.

## 2017-03-15 ENCOUNTER — Encounter: Payer: Self-pay | Admitting: Family Medicine

## 2017-03-15 ENCOUNTER — Other Ambulatory Visit: Payer: Self-pay

## 2017-03-15 ENCOUNTER — Ambulatory Visit: Payer: 59 | Admitting: Family Medicine

## 2017-03-15 DIAGNOSIS — S96812A Strain of other specified muscles and tendons at ankle and foot level, left foot, initial encounter: Secondary | ICD-10-CM

## 2017-03-15 MED ORDER — DICLOFENAC SODIUM 2 % TD SOLN
2.0000 g | Freq: Two times a day (BID) | TRANSDERMAL | 3 refills | Status: DC
Start: 2017-03-15 — End: 2017-12-27

## 2017-03-15 MED ORDER — MELOXICAM 15 MG PO TABS: 15.0000 mg | ORAL_TABLET | Freq: Every day | ORAL | 0 refills | Status: DC

## 2017-03-15 MED ORDER — GABAPENTIN 100 MG PO CAPS: 200.0000 mg | ORAL_CAPSULE | Freq: Every day | ORAL | 3 refills | Status: DC

## 2017-03-15 NOTE — Assessment & Plan Note (Signed)
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  We will evaluate again at follow-up.  Having difficulty even with walking.  Put in a Cam walker at this time.  Patient will be in this for the next 2 weeks.  Put in seated work only at work.  Will further evaluate with ultrasound likely at follow-up.

## 2017-03-15 NOTE — Progress Notes (Signed)
Carlos ScaleZach Shaundra Tapia D.O. [facility] [address] [place], [name] Phone: [phone] Subjective:     CC: Foot pain  BJY:NWGNFAOZHYHPI:Subjective  Carlos KohutMichael A Tapia is a 46 y.o. male coming in with complaint of left foot pain. States that  he feels like he's stepping on a golf ball with each step. Pain is starting to make his legs hurt.  Onset- 3 months ago Location- Medial heel  Duration- Morning and night. Now all day Character- Throbbing  Aggravating factors- Walking on it Reliving factors-  Therapies tried- Insoles  Severity-9 out of 10 and worsening     Past Medical History:  Diagnosis Date  . Blood in stool   . Chicken pox   . GERD (gastroesophageal reflux disease)   . Migraines   . Shingles 2009, 2010   No past surgical history on file. Social History   Socioeconomic History  . Marital status: Married    Spouse name: None  . Number of children: 2  . Years of education: 512  . Highest education level: None  Social Needs  . Financial resource strain: None  . Food insecurity - worry: None  . Food insecurity - inability: None  . Transportation needs - medical: None  . Transportation needs - non-medical: None  Occupational History  . Occupation: Location managerMachine operator  Tobacco Use  . Smoking status: Former Smoker    Packs/day: 0.75    Years: 30.00    Pack years: 22.50    Types: Cigarettes    Last attempt to quit: 03/30/2016    Years since quitting: 0.9  . Smokeless tobacco: Never Used  Substance and Sexual Activity  . Alcohol use: Yes    Comment: rarely  . Drug use: Yes    Frequency: 7.0 times per week    Types: Marijuana  . Sexual activity: No    Birth control/protection: None  Other Topics Concern  . None  Social History Narrative   Fun: Video games   Denies religious beliefs effecting health care.    No Known Allergies Family History  Adopted: Yes  Problem Relation Age of Onset  . Heart disease Mother   . Colon cancer Neg Hx      Past  medical history, social, surgical and family history all reviewed in electronic medical record.  No pertanent information unless stated regarding to the chief complaint.   Review of Systems:Review of systems updated and as accurate as of 03/15/17  No headache, visual changes, nausea, vomiting, diarrhea, constipation, dizziness, abdominal pain, skin rash, fevers, chills, night sweats, weight loss, swollen lymph nodes, body aches, joint swelling, muscle aches, chest pain, shortness of breath, mood changes.   Objective  Blood pressure (!) 142/100, pulse 96, height 5\' 10"  (1.778 m), weight (!) 329 lb (149.2 kg), SpO2 97 %. Systems examined below as of 03/15/17   General: No apparent distress alert and oriented x3 mood and affect normal, dressed appropriately.  Morbidly obese HEENT: Pupils equal, extraocular movements intact  Respiratory: Patient's speak in full sentences and does not appear short of breath  Cardiovascular: No lower extremity edema, non tender, no erythema  Skin: Warm dry intact with no signs of infection or rash on extremities or on axial skeleton.  Abdomen: Soft nontender  Neuro: Cranial nerves II through XII are intact, neurovascularly intact in all extremities with 2+ DTRs and 2+ pulses.  Lymph: No lymphadenopathy of posterior or anterior cervical chain or axillae bilaterally.  Gait antalgic gait favoring the left foot MSK:  Non tender with full range of motion and good stability and symmetric strength and tone of shoulders, elbows, wrist, hip, knee and ankles bilaterally.  Left foot exam shows the patient does have significant pes planus with over pronation of the mid and hindfoot.  Severely tender to palpation over the medial calcaneal region.  Patient does have some warmth to the area as well.  Patient does have abnormality of the plantaris tendon noted.  Difficult for patient to flex midfoot.   Impression and Recommendations:     This case required medical decision making  of moderate complexity.      Note: This dictation was prepared with Dragon dictation along with smaller phrase technology. Any transcriptional errors that result from this process are unintentional.

## 2017-03-15 NOTE — Patient Instructions (Addendum)
Good to see you  Gustavus Bryantce is your friend. Ice 20 minutes 2 times daily. Usually after activity and before bed. Wear the boot daily  For 2 weeks but not at night pennsaid pinkie amount topically 2 times daily as needed.  Meloxicam daily for 10 days then as needed Gabapentin 200mg  at night See you again in 2 weeks

## 2017-03-21 ENCOUNTER — Institutional Professional Consult (permissible substitution): Payer: Self-pay | Admitting: Pulmonary Disease

## 2017-03-28 ENCOUNTER — Telehealth: Payer: Self-pay | Admitting: Family

## 2017-03-28 NOTE — Telephone Encounter (Signed)
Copied from CRM [phone]. Topic: Quick Communication - See Telephone Encounter >> Mar 28, 2017 11:19 AM Trula SladeWalter, Linda F wrote: CRM for notification. See Telephone encounter for:  03/28/17. The pharmacist for the Pensaid medication that Dr. Antoine PrimasZachary Smith prescribed has not contacted the patient.  Please advise the status on the medication.

## 2017-03-30 NOTE — Telephone Encounter (Signed)
Pt will be f/u w/Dr. Katrinka BlazingSmith on Monday 04/02/17...Raechel Chute/lmb

## 2017-04-01 NOTE — Progress Notes (Signed)
Tawana ScaleZach Kiondra Caicedo D.O. [facility] [address] [place], [place] [address] Phone: [phone] Subjective:    I'm seeing this patient by the request  of:    CC: Foot pain follow-up  NUU:[id number]:Subjective  Carlos KohutMichael A Tapia is a 46 y.o. 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  Put in a Cam walker.  Patient was to slowly increase activity.  Started on gabapentin for some of the nerve irritation.  Patient states that his pain is improving since last visit. He has been wearing the cam walker. Most of his pain is felt in the morning in which he describes a golf ball feeling under the heel.         Past Medical History:  Diagnosis Date  . Blood in stool   . Chicken pox   . GERD (gastroesophageal reflux disease)   . Migraines   . Shingles 2009, 2010   No past surgical history on file. Social History   Socioeconomic History  . Marital status: Married    Spouse name: None  . Number of children: 2  . Years of education: 6912  . Highest education level: None  Social Needs  . Financial resource strain: None  . Food insecurity - worry: None  . Food insecurity - inability: None  . Transportation needs - medical: None  . Transportation needs - non-medical: None  Occupational History  . Occupation: Location managerMachine operator  Tobacco Use  . Smoking status: Former Smoker    Packs/day: 0.75    Years: 30.00    Pack years: 22.50    Types: Cigarettes    Last attempt to quit: 03/30/2016    Years since quitting: 1.0  . Smokeless tobacco: Never Used  Substance and Sexual Activity  . Alcohol use: Yes    Comment: rarely  . Drug use: Yes    Frequency: 7.0 times per week    Types: Marijuana  . Sexual activity: No    Birth control/protection: None  Other Topics Concern  . None  Social History Narrative   Fun: Video games   Denies religious beliefs effecting health care.    No Known Allergies Family History  Adopted: Yes  Problem Relation  Age of Onset  . Heart disease Mother   . Colon cancer Neg Hx      Past medical history, social, surgical and family history all reviewed in electronic medical record.  No pertanent information unless stated regarding to the chief complaint.   Review of Systems:Review of systems updated and as accurate as of 04/02/17  No headache, visual changes, nausea, vomiting, diarrhea, constipation, dizziness, abdominal pain, skin rash, fevers, chills, night sweats, weight loss, swollen lymph nodes, body aches, joint swelling, muscle aches, chest pain, shortness of breath, mood changes.   Objective  Blood pressure 110/88, pulse 75, height 5\' 10"  (1.778 m), weight (!) 324 lb (147 kg), SpO2 97 %. Systems examined below as of 04/02/17   General: No apparent distress alert and oriented x3 mood and affect normal, dressed appropriately.  HEENT: Pupils equal, extraocular movements intact  Respiratory: Patient's speak in full sentences and does not appear short of breath  Cardiovascular: No lower extremity edema, non tender, no erythema  Skin: Warm dry intact with no signs of infection or rash on extremities or on axial skeleton.  Abdomen: Soft nontender  Neuro: Cranial nerves II through XII are intact, neurovascularly intact in all extremities with 2+ DTRs and 2+ pulses.  Lymph: No lymphadenopathy of posterior or anterior cervical chain or axillae bilaterally.  Gait antalgic walking in a Cam walker MSK:  Non tender with full range of motion and good stability and symmetric strength and tone of shoulders, elbows, wrist, hip, knee and ankles bilaterally.  Foot exam shows significant decrease in the swelling.  Patient still has the overpronation of the hindfoot as well as breakdown the longitudinal and transverse arch.  Patient is significantly less painful in the area.  Patient does have some new broken capillaries over the medial aspect of the malleolus but otherwise unremarkable.  Limited musculoskeletal  ultrasound was performed and interpreted by Judi SaaZachary M Zanayah Shadowens  The patient did have more of a plantaris rupture noted.  Patient has significant healing with scar tissue formation.  Mild increase in Doppler flow.    Impression and Recommendations:     This case required medical decision making of moderate complexity.      Note: This dictation was prepared with Dragon dictation along with smaller phrase technology. Any transcriptional errors that result from this process are unintentional.

## 2017-04-02 ENCOUNTER — Encounter: Payer: Self-pay | Admitting: Family Medicine

## 2017-04-02 ENCOUNTER — Ambulatory Visit: Payer: Self-pay

## 2017-04-02 ENCOUNTER — Ambulatory Visit: Payer: 59 | Admitting: Family Medicine

## 2017-04-02 VITALS — BP 110/88 | HR 75 | Ht 70.0 in | Wt 324.0 lb

## 2017-04-02 DIAGNOSIS — S96812A Strain of other specified muscles and tendons at ankle and foot level, left foot, initial encounter: Secondary | ICD-10-CM | POA: Diagnosis not present

## 2017-04-02 DIAGNOSIS — M79672 Pain in left foot: Secondary | ICD-10-CM

## 2017-04-02 NOTE — Patient Instructions (Signed)
good see you Ice is your friend.   Wear boot at work for 2 more week  OK rigid shoe otherwise Exercises 3 times a week.  See em again in 2-3 weeks Happy New Year! '

## 2017-04-02 NOTE — Assessment & Plan Note (Signed)
Patient is making some progress.  We discussed icing regimen and home exercises.  Patient was given home exercises today with athletic trainer.  We discussed the possibility of formal physical therapy but patient would like to try to do this on his own.  Continue 2 weeks of work wearing the Cam walker at work.  Follow-up again 2-4 weeks for further evaluation.

## 2017-04-18 ENCOUNTER — Encounter: Payer: Self-pay | Admitting: Internal Medicine

## 2017-04-25 NOTE — Progress Notes (Deleted)
Tawana ScaleZach Tapia D.O. [facility] [address] [place], [name] Phone: [phone] Subjective:    I'm seeing this patient by the request  of:    CC: Foot pain follow-up  BJY:NWGNFAOZHYHPI:Subjective  Carlos KohutMichael A Tapia is a 47 y.o. male coming in with complaint of foot pain.  Found to have a left foot plantaris rupture.  Patient was put in a Cam walker as well as to do icing regimen and home exercises.  Patient states    Past Medical History:  Diagnosis Date  . Blood in stool   . Chicken pox   . GERD (gastroesophageal reflux disease)   . Migraines   . Shingles 2009, 2010   No past surgical history on file. Social History   Socioeconomic History  . Marital status: Married    Spouse name: Not on file  . Number of children: 2  . Years of education: 4312  . Highest education level: Not on file  Social Needs  . Financial resource strain: Not on file  . Food insecurity - worry: Not on file  . Food insecurity - inability: Not on file  . Transportation needs - medical: Not on file  . Transportation needs - non-medical: Not on file  Occupational History  . Occupation: Location managerMachine operator  Tobacco Use  . Smoking status: Former Smoker    Packs/day: 0.75    Years: 30.00    Pack years: 22.50    Types: Cigarettes    Last attempt to quit: 03/30/2016    Years since quitting: 1.0  . Smokeless tobacco: Never Used  Substance and Sexual Activity  . Alcohol use: Yes    Comment: rarely  . Drug use: Yes    Frequency: 7.0 times per week    Types: Marijuana  . Sexual activity: No    Birth control/protection: None  Other Topics Concern  . Not on file  Social History Narrative   Fun: Video games   Denies religious beliefs effecting health care.    No Known Allergies Family History  Adopted: Yes  Problem Relation Age of Onset  . Heart disease Mother   . Colon cancer Neg Hx      Past medical history, social, surgical and family history all reviewed in electronic medical record.   No pertanent information unless stated regarding to the chief complaint.   Review of Systems:Review of systems updated and as accurate as of 04/25/17  No headache, visual changes, nausea, vomiting, diarrhea, constipation, dizziness, abdominal pain, skin rash, fevers, chills, night sweats, weight loss, swollen lymph nodes, body aches, joint swelling, muscle aches, chest pain, shortness of breath, mood changes.   Objective  There were no vitals taken for this visit. Systems examined below as of 04/25/17   General: No apparent distress alert and oriented x3 mood and affect normal, dressed appropriately.  HEENT: Pupils equal, extraocular movements intact  Respiratory: Patient's speak in full sentences and does not appear short of breath  Cardiovascular: No lower extremity edema, non tender, no erythema  Skin: Warm dry intact with no signs of infection or rash on extremities or on axial skeleton.  Abdomen: Soft nontender  Neuro: Cranial nerves II through XII are intact, neurovascularly intact in all extremities with 2+ DTRs and 2+ pulses.  Lymph: No lymphadenopathy of posterior or anterior cervical chain or axillae bilaterally.  Gait normal with good balance and coordination.  MSK:  Non tender with full range of motion and good stability and symmetric strength and tone of  shoulders, elbows, wrist, hip, knee and ankles bilaterally.     Impression and Recommendations:     This case required medical decision making of moderate complexity.      Note: This dictation was prepared with Dragon dictation along with smaller phrase technology. Any transcriptional errors that result from this process are unintentional.

## 2017-04-26 ENCOUNTER — Ambulatory Visit: Payer: Self-pay | Admitting: Family Medicine

## 2017-04-26 DIAGNOSIS — Z0289 Encounter for other administrative examinations: Secondary | ICD-10-CM

## 2017-08-02 ENCOUNTER — Ambulatory Visit: Payer: 59 | Admitting: Family Medicine

## 2017-08-02 ENCOUNTER — Other Ambulatory Visit: Payer: Self-pay

## 2017-08-02 ENCOUNTER — Encounter: Payer: Self-pay | Admitting: Family Medicine

## 2017-08-02 VITALS — BP 126/88 | HR 94 | Temp 98.7°F | Resp 17 | Ht 70.0 in | Wt 329.8 lb

## 2017-08-02 DIAGNOSIS — J301 Allergic rhinitis due to pollen: Secondary | ICD-10-CM

## 2017-08-02 DIAGNOSIS — R0981 Nasal congestion: Secondary | ICD-10-CM | POA: Diagnosis not present

## 2017-08-02 DIAGNOSIS — H1011 Acute atopic conjunctivitis, right eye: Secondary | ICD-10-CM | POA: Diagnosis not present

## 2017-08-02 MED ORDER — POLYMYXIN B-TRIMETHOPRIM 10000-0.1 UNIT/ML-% OP SOLN: 1.0000 [drp] | OPHTHALMIC | 0 refills | Status: DC

## 2017-08-02 MED ORDER — FLUTICASONE PROPIONATE 50 MCG/ACT NA SUSP: 2.0000 | Freq: Every day | NASAL | 6 refills | Status: DC

## 2017-08-02 MED ORDER — OLOPATADINE HCL 0.2 % OP SOLN: 1.0000 [drp] | Freq: Every day | OPHTHALMIC | 0 refills | Status: DC

## 2017-08-02 MED ORDER — CETIRIZINE HCL 10 MG PO TABS
10.0000 mg | ORAL_TABLET | Freq: Every day | ORAL | 2 refills | Status: DC
Start: 2017-08-02 — End: 2017-12-27

## 2017-08-02 NOTE — Progress Notes (Signed)
Chief Complaint  Patient presents with  . ? PINK EYE RIGHT EYE    ONSET: YESTERDAY MORNING WOKE UP WITH EYE MATTED SHUT WITH MUCUS ON EYELIDS, WENT TO WORK AND EYE GOT WORSE, PT C/O RIGHT EAR PAIN, ? SINUS INFECTION AS WELL, NOT TAKING OTC MEDS FOR SYMPTOMS    HPI  Patient works as a Location manager He was wearing his safety shields He states that for the past 3 days he was having itchy eyes but seems like his eye was gritty and his upper eye lid was very swollen He denies fevers or chills Reports that he took an occasional zyrtec but not every day Reports that he also gets pressure in his right ear and right sinus pressure    Past Medical History:  Diagnosis Date  . Blood in stool   . Chicken pox   . GERD (gastroesophageal reflux disease)   . Migraines   . Shingles 2009, 2010    Current Outpatient Medications  Medication Sig Dispense Refill  . cetirizine (ZYRTEC) 10 MG tablet Take 1 tablet (10 mg total) by mouth daily. 30 tablet 2  . Diclofenac Sodium (PENNSAID) 2 % SOLN Place 2 g onto the skin 2 (two) times daily. (Patient not taking: Reported on 08/02/2017) 112 g 3  . diphenhydramine-acetaminophen (TYLENOL PM) 25-500 MG TABS tablet Take 1 tablet by mouth at bedtime as needed.    . fluticasone (FLONASE) 50 MCG/ACT nasal spray Place 2 sprays into both nostrils daily. 16 g 6  . gabapentin (NEURONTIN) 100 MG capsule Take 2 capsules (200 mg total) by mouth at bedtime. (Patient not taking: Reported on 08/02/2017) 60 capsule 3  . meloxicam (MOBIC) 15 MG tablet Take 1 tablet (15 mg total) by mouth daily. (Patient not taking: Reported on 08/02/2017) 30 tablet 0  . Olopatadine HCl 0.2 % SOLN Apply 1 drop to eye daily. 2.5 mL 0  . testosterone (TESTIM) 50 MG/5GM (1%) GEL Place 5 g onto the skin daily. 450 g 1  . trimethoprim-polymyxin b (POLYTRIM) ophthalmic solution Place 1 drop into the right eye every 4 (four) hours. 10 mL 0   No current facility-administered medications for this visit.      Allergies: No Known Allergies  No past surgical history on file.  Social History   Socioeconomic History  . Marital status: Married    Spouse name: Not on file  . Number of children: 2  . Years of education: 57  . Highest education level: Not on file  Occupational History  . Occupation: Location manager  Social Needs  . Financial resource strain: Not on file  . Food insecurity:    Worry: Not on file    Inability: Not on file  . Transportation needs:    Medical: Not on file    Non-medical: Not on file  Tobacco Use  . Smoking status: Former Smoker    Packs/day: 0.75    Years: 30.00    Pack years: 22.50    Types: Cigarettes    Last attempt to quit: 03/30/2016    Years since quitting: 1.3  . Smokeless tobacco: Never Used  Substance and Sexual Activity  . Alcohol use: Yes    Comment: rarely  . Drug use: Yes    Frequency: 7.0 times per week    Types: Marijuana  . Sexual activity: Never    Birth control/protection: None  Lifestyle  . Physical activity:    Days per week: Not on file    Minutes per session:  Not on file  . Stress: Not on file  Relationships  . Social connections:    Talks on phone: Not on file    Gets together: Not on file    Attends religious service: Not on file    Active member of club or organization: Not on file    Attends meetings of clubs or organizations: Not on file    Relationship status: Not on file  Other Topics Concern  . Not on file  Social History Narrative   Fun: Video games   Denies religious beliefs effecting health care.     Family History  Adopted: Yes  Problem Relation Age of Onset  . Heart disease Mother   . Colon cancer Neg Hx      ROS Review of Systems See HPI Constitution: No fevers or chills No malaise No diaphoresis Skin: No rash or itching Eyes: no blurry vision, no double vision GU: no dysuria or hematuria Neuro: no dizziness or headaches * all others reviewed and negative   Objective: Vitals:     08/02/17 1631  BP: 126/88  Pulse: 94  Resp: 17  Temp: 98.7 F (37.1 C)  TempSrc: Oral  SpO2: 98%  Weight: (!) 329 lb 12.8 oz (149.6 kg)  Height:  (1.778 m)    Physical Exam  General: alert, oriented, in NAD Head: normocephalic, atraumatic, no sinus tenderness Eyes: EOM intact, no scleral icterus or conjunctival injection, right upper lid with edema, no foreign body, no stye, left eye normal Ears: TM clear bilaterally Nose: mucosa nonerythematous, nonedematous Throat: no pharyngeal exudate or erythema Lymph: no posterior auricular, submental or cervical lymph adenopathy Heart: normal rate, normal sinus rhythm, no murmurs Lungs: clear to auscultation bilaterally, no wheezing   Assessment and Plan Carlos Tapia was seen today for ? pink eye right eye.  Diagnoses and all orders for this visit:  Allergic conjunctivitis of right eye Sinus congestion Seasonal allergic rhinitis due to pollen  Advised supportive care with antihistamine oral, antihistamine gtt AND flonase He should use polytrim if he develops bacterial conjunctivitis (signs and symptoms reviewed extensively)  Other orders -     Olopatadine HCl 0.2 % SOLN; Apply 1 drop to eye daily. -     fluticasone (FLONASE) 50 MCG/ACT nasal spray; Place 2 sprays into both nostrils daily. -     cetirizine (ZYRTEC) 10 MG tablet; Take 1 tablet (10 mg total) by mouth daily. -     trimethoprim-polymyxin b (POLYTRIM) ophthalmic solution; Place 1 drop into the right eye every 4 (four) hours.     Kasiyah Platter A Cathyann Kilfoyle

## 2017-08-02 NOTE — Patient Instructions (Addendum)
If the eye discharge gets thick and yellow and the eye becomes more red go ahead and fill the antibiotic eye drop   IF you received an x-ray today, you will receive an invoice from [facility]. Please contact [facility] at [phone] with questions or concerns regarding your invoice.   IF you received labwork today, you will receive an invoice from [facility]. Please contact [facility] at [phone] with questions or concerns regarding your invoice.   Our billing staff will not be able to assist you with questions regarding bills from these companies.  You will be contacted with the lab results as soon as they are available. The fastest way to get your results is to activate your My Chart account. Instructions are located on the last page of this paperwork. If you have not heard from [place] regarding the results in 2 weeks, please contact this office.     Allergic Conjunctivitis, Adult Allergic conjunctivitis is inflammation of the clear membrane that covers the white part of your eye and the inner surface of your eyelid (conjunctiva). The inflammation is caused by allergies. The blood vessels in the conjunctiva become inflamed and this causes the eyes to become red or pink. The eyes often feel itchy. Allergic conjunctivitis cannot be spread from one person to another person (is not contagious). What are the causes? This condition is caused by an allergic reaction. Common causes of an allergic reaction (allergens) include:  Outdoor allergens, such as: ? Pollen. ? Grass and weeds. ? Mold spores.  Indoor allergens, such as: ? Dust. ? Smoke. ? Mold. ? Pet dander. ? Animal hair.  What increases the risk? You may be more likely to develop this condition if you have a family history of allergies, such as:  Allergic rhinitis.  Bronchial asthma.  Atopic dermatitis.  What are the signs or symptoms? Symptoms of this condition include eyes that  are:  Itchy.  Red.  Watery.  Puffy.  Your eyes may also:  Sting or burn.  Have clear drainage coming from them.  How is this diagnosed? This condition may be diagnosed by medical history and physical exam. If you have drainage from your eyes, it may be tested to rule out other causes of conjunctivitis. You may also need to see a health care provider who specializes in treating allergies (allergist) or eye conditions (ophthalmologist) for tests to confirm the diagnosis. You may have:  Skin tests to see which allergens are causing your symptoms. These tests involve pricking the skin with a tiny needle and exposing the skin to small amounts of potential allergens to see if your skin reacts.  Blood tests.  Tissue scrapings from your eyelid. These will be examined under a microscope.  How is this treated? Treatments for this condition may include:  Cold cloths (compresses) to soothe itching and swelling.  Washing the face to remove allergens.  Eye drops. These may be prescription or over-the-counter. There are several different types. You may need to try different types to see which one works best for you. Your may need: ? Eye drops that block the allergic reaction (antihistamine). ? Eye drops that reduce swelling and irritation (anti-inflammatory). ? Steroid eye drops to lessen a severe reaction (vernal conjunctivitis).  Oral antihistamine medicines to reduce your allergic reaction. You may need these if eye drops do not help or are difficult to use.  Follow these instructions at home:  Avoid known allergens whenever possible.  Take or apply over-the-counter and prescription medicines only as  told by your health care provider. These include any eye drops.  Apply a cool, clean washcloth to your eye for 10-20 minutes, 3-4 times a day.  Do not touch or rub your eyes.  Do not wear contact lenses until the inflammation is gone. Wear glasses instead.  Do not wear eye makeup  until the inflammation is gone.  Keep all follow-up visits as told by your health care provider. This is important. Contact a health care provider if:  Your symptoms get worse or do not improve with treatment.  You have mild eye pain.  You have sensitivity to light.  You have spots or blisters on your eyes.  You have pus draining from your eye.  You have a fever. Get help right away if:  You have redness, swelling, or other symptoms in only one eye.  Your vision is blurred or you have vision changes.  You have severe eye pain. This information is not intended to replace advice given to you by your health care provider. Make sure you discuss any questions you have with your health care provider. Document Released: 06/10/2002 Document Revised: 11/17/2015 Document Reviewed: 10/01/2015 Elsevier Interactive Patient Education  2018 [employer].

## 2017-08-13 ENCOUNTER — Other Ambulatory Visit: Payer: Self-pay | Admitting: Family Medicine

## 2017-08-13 MED ORDER — AZELASTINE HCL 0.05 % OP SOLN: 1.0000 [drp] | Freq: Every day | OPHTHALMIC | 12 refills | Status: DC

## 2017-08-17 ENCOUNTER — Telehealth: Payer: Self-pay

## 2017-08-17 NOTE — Telephone Encounter (Signed)
Rejection of PA for olopatadine received. This is a plan exclusion. Please advise.

## 2017-12-27 ENCOUNTER — Encounter: Payer: Self-pay | Admitting: Family

## 2017-12-27 ENCOUNTER — Ambulatory Visit: Payer: 59 | Admitting: Family

## 2017-12-27 VITALS — BP 132/90 | HR 81 | Temp 98.7°F | Ht 70.0 in | Wt 324.1 lb

## 2017-12-27 DIAGNOSIS — J209 Acute bronchitis, unspecified: Secondary | ICD-10-CM

## 2017-12-27 DIAGNOSIS — J019 Acute sinusitis, unspecified: Secondary | ICD-10-CM

## 2017-12-27 MED ORDER — AMOXICILLIN-POT CLAVULANATE 875-125 MG PO TABS: 1.0000 | ORAL_TABLET | Freq: Two times a day (BID) | ORAL | 0 refills | Status: DC

## 2017-12-27 MED ORDER — FLUTICASONE PROPIONATE 50 MCG/ACT NA SUSP
2.0000 | Freq: Every day | NASAL | 6 refills | Status: DC
Start: 2017-12-27 — End: 2018-08-09

## 2017-12-27 MED ORDER — METHYLPREDNISOLONE 4 MG PO TBPK: ORAL_TABLET | ORAL | 0 refills | Status: DC

## 2017-12-27 NOTE — Progress Notes (Signed)
Carlos Tapia is a 47 y.o. male with the following history as recorded in [employer]:  Patient Active Problem List   Diagnosis Date Noted  . Rupture of plantaris tendon, left, initial encounter 03/15/2017  . Hypersomnolence 11/28/2016  . Hematochezia 11/28/2016  . Morbid obesity (HCC) 09/06/2015  . Acute sinus infection 05/19/2015  . Insomnia 08/19/2014  . Fatigue 08/19/2014  . Elevated systolic blood pressure 08/19/2014    Current Outpatient Medications  Medication Sig Dispense Refill  . amoxicillin-clavulanate (AUGMENTIN) 875-125 MG tablet Take 1 tablet by mouth 2 (two) times daily. 20 tablet 0  . diphenhydramine-acetaminophen (TYLENOL PM) 25-500 MG TABS tablet Take 1 tablet by mouth at bedtime as needed.    . fluticasone (FLONASE) 50 MCG/ACT nasal spray Place 2 sprays into both nostrils daily. 16 g 6  . methylPREDNISolone (MEDROL DOSEPAK) 4 MG TBPK tablet Taper as directed 21 tablet 0   No current facility-administered medications for this visit.     Allergies: Patient has no known allergies.  Past Medical History:  Diagnosis Date  . Blood in stool   . Chicken pox   . GERD (gastroesophageal reflux disease)   . Migraines   . Shingles 2009, 2010    History reviewed. No pertinent surgical history.  Family History  Adopted: Yes  Problem Relation Age of Onset  . Heart disease Mother   . Colon cancer Neg Hx     Social History   Tobacco Use  . Smoking status: Former Smoker    Packs/day: 0.75    Years: 30.00    Pack years: 22.50    Types: Cigarettes    Last attempt to quit: 03/30/2016    Years since quitting: 1.7  . Smokeless tobacco: Never Used  Substance Use Topics  . Alcohol use: Yes    Comment: rarely    Subjective:  Patient presents with concerns for sinus infection; symptoms progressing into cough/ chest congestion; seasonal allergies- not currently taking any medication; no fever; using OTC Dayquil/ Nyquil;   Objective:  Vitals:   12/27/17 1528  BP:  132/90  Pulse: 81  Temp: 98.7 F (37.1 C)  TempSrc: Oral  SpO2: 96%  Weight: (!) 324 lb 1.3 oz (147 kg)  Height: 5\' 10"  (1.778 m)    General: Well developed, well nourished, in no acute distress  Skin : Warm and dry.  Head: Normocephalic and atraumatic  Eyes: Sclera and conjunctiva clear; pupils round and reactive to light; extraocular movements intact  Ears: External normal; canals clear; tympanic membranes normal  Oropharynx: Pink, supple. No suspicious lesions  Neck: Supple without thyromegaly, adenopathy  Lungs: Respirations unlabored; wheezing noted on exam CVS exam: normal rate and regular rhythm.  Neurologic: Alert and oriented; speech intact; face symmetrical; moves all extremities well; CNII-XII intact without focal deficit   Assessment:  1. Acute sinusitis, recurrence not specified, unspecified location   2. Acute bronchitis, unspecified organism     Plan:  Rx for Augmentin 875 mg bid x 10 days; Rx for Medrol dose pak- take as directed; stressed need to use Flonase with regularity to help with allergy symptoms; work note given as requested; increase fluids, rest and follow-up worse, no better.  Encouraged to schedule for CPE;  No follow-ups on file.  No orders of the defined types were placed in this encounter.   Requested Prescriptions   Signed Prescriptions Disp Refills  . amoxicillin-clavulanate (AUGMENTIN) 875-125 MG tablet 20 tablet 0    Sig: Take 1 tablet by mouth 2 (two)  times daily.  . methylPREDNISolone (MEDROL DOSEPAK) 4 MG TBPK tablet 21 tablet 0    Sig: Taper as directed  . fluticasone (FLONASE) 50 MCG/ACT nasal spray 16 g 6    Sig: Place 2 sprays into both nostrils daily.

## 2018-01-28 ENCOUNTER — Encounter: Payer: Self-pay | Admitting: Family

## 2018-01-28 DIAGNOSIS — Z0289 Encounter for other administrative examinations: Secondary | ICD-10-CM

## 2018-08-09 ENCOUNTER — Other Ambulatory Visit: Payer: Self-pay | Admitting: Podiatry

## 2018-08-09 ENCOUNTER — Ambulatory Visit: Payer: 59 | Admitting: Podiatry

## 2018-08-09 ENCOUNTER — Encounter: Payer: Self-pay | Admitting: Podiatry

## 2018-08-09 ENCOUNTER — Ambulatory Visit (INDEPENDENT_AMBULATORY_CARE_PROVIDER_SITE_OTHER): Payer: 59

## 2018-08-09 ENCOUNTER — Other Ambulatory Visit: Payer: Self-pay

## 2018-08-09 VITALS — Temp 97.5°F

## 2018-08-09 DIAGNOSIS — M779 Enthesopathy, unspecified: Secondary | ICD-10-CM

## 2018-08-09 DIAGNOSIS — M19072 Primary osteoarthritis, left ankle and foot: Secondary | ICD-10-CM | POA: Diagnosis not present

## 2018-08-09 MED ORDER — MELOXICAM 15 MG PO TABS: 15.0000 mg | ORAL_TABLET | Freq: Every day | ORAL | 0 refills | Status: DC

## 2018-08-11 NOTE — Progress Notes (Signed)
Subjective:   Patient ID: Carlos Tapia, male   DOB: 48 y.o.   MRN: [id number]   HPI 48 year old male presents the office today for concerns of pain to the outside aspect of his left foot.  He states this is been ongoing last 5 to 6 months.  He is previously been treated for plantar fasciitis he thinks that because of compensation is because of pain.  He denies any recent injury or trauma.  He states it hurts to walk at times.  He does get swelling to the area.  No redness or warmth.  No other concerns.  No recent treatment.   Review of Systems  All other systems reviewed and are negative.  Past Medical History:  Diagnosis Date  . Blood in stool   . Chicken pox   . GERD (gastroesophageal reflux disease)   . Migraines   . Shingles 2009, 2010    History reviewed. No pertinent surgical history.   Current Outpatient Medications:  .  meloxicam (MOBIC) 15 MG tablet, Take 1 tablet (15 mg total) by mouth daily., Disp: 30 tablet, Rfl: 0  No Known Allergies      Objective:  Physical Exam  General: AAO x3, NAD  Dermatological: Skin is warm, dry and supple bilateral. Nails x 10 are well manicured; remaining integument appears unremarkable at this time. There are no open sores, no preulcerative lesions, no rash or signs of infection present.  Vascular: Dorsalis Pedis artery and Posterior Tibial artery pedal pulses are 2/4 bilateral with immedate capillary fill time. Pedal hair growth present. No varicosities and no lower extremity edema present bilateral. There is no pain with calf compression, swelling, warmth, erythema.   Neruologic: Grossly intact via light touch bilateral. Vibratory intact via tuning fork bilateral. Protective threshold with Semmes Wienstein monofilament intact to all pedal sites bilateral.   Musculoskeletal: There is tenderness palpation along the fifth metatarsal base on the lateral aspect the left foot.  Was localized in the area there is no erythema or warmth.   There is no pain along the course the peroneal tendon.  The tendon appears to be intact.  Mild discomfort on the fourth, fifth metatarsal cuboid joint.  There is no erythema or warmth.  Flexor, extensor tendons appear to be intact.  No other areas of tenderness is elicited at this time.. Muscular strength 5/5 in all groups tested bilateral.  Gait: Unassisted, Nonantalgic.       Assessment:  Fifth metatarsal base discomfort, insertional peroneal tendinitis     Plan:  -Treatment options discussed including all alternatives, risks, and complications -Etiology of symptoms were discussed -X-rays were obtained and reviewed with the patient.  Mild rthritic changes present of the fourth and fifth metatarsal cuboid joint.  No evidence of acute fracture. -Today steroid injection was performed.  There is clean with alcohol and a mixture of 0.25 cc of dexamethasone phosphate and 0.25 cc of Kenalog 10, 0.5 cc of Marcaine plain was infiltrated into the fifth metatarsal base without any complications.  Tolerated well.  Post procedure instructions were discussed. -Plantar fascial brace was applied medial to lateral. -Prescribed mobic. Discussed side effects of the medication and directed to stop if any are to occur and call the office.  -Ice -Shoe modifications and discussed orthotics  Return in about 3 weeks (around 08/30/2018).  Vivi Barrack DPM

## 2018-08-12 ENCOUNTER — Ambulatory Visit: Payer: 59 | Admitting: Podiatry

## 2018-08-30 ENCOUNTER — Ambulatory Visit: Payer: 59 | Admitting: Podiatry

## 2018-09-20 ENCOUNTER — Ambulatory Visit: Payer: 59 | Admitting: Podiatry

## 2019-01-21 ENCOUNTER — Encounter: Payer: Self-pay | Admitting: Internal Medicine

## 2019-01-21 ENCOUNTER — Ambulatory Visit (INDEPENDENT_AMBULATORY_CARE_PROVIDER_SITE_OTHER): Payer: 59 | Admitting: Internal Medicine

## 2019-01-21 ENCOUNTER — Ambulatory Visit: Payer: 59 | Admitting: Internal Medicine

## 2019-01-21 VITALS — Temp 97.0°F

## 2019-01-21 DIAGNOSIS — J309 Allergic rhinitis, unspecified: Secondary | ICD-10-CM | POA: Diagnosis not present

## 2019-01-21 DIAGNOSIS — J4531 Mild persistent asthma with (acute) exacerbation: Secondary | ICD-10-CM

## 2019-01-21 DIAGNOSIS — J45909 Unspecified asthma, uncomplicated: Secondary | ICD-10-CM | POA: Insufficient documentation

## 2019-01-21 MED ORDER — AZELASTINE-FLUTICASONE 137-50 MCG/ACT NA SUSP: NASAL | 5 refills | Status: DC

## 2019-01-21 MED ORDER — BUDESONIDE-FORMOTEROL FUMARATE 160-4.5 MCG/ACT IN AERO: 2.0000 | INHALATION_SPRAY | Freq: Two times a day (BID) | RESPIRATORY_TRACT | 11 refills | Status: DC

## 2019-01-21 MED ORDER — PHENTERMINE HCL 37.5 MG PO CAPS: 37.5000 mg | ORAL_CAPSULE | ORAL | 0 refills | Status: DC

## 2019-01-21 MED ORDER — ALBUTEROL SULFATE HFA 108 (90 BASE) MCG/ACT IN AERS: 2.0000 | INHALATION_SPRAY | Freq: Four times a day (QID) | RESPIRATORY_TRACT | 11 refills | Status: DC | PRN

## 2019-01-21 MED ORDER — PREDNISONE 10 MG PO TABS: ORAL_TABLET | ORAL | 0 refills | Status: DC

## 2019-01-21 NOTE — Assessment & Plan Note (Signed)
Clinical diagnosis  - for dymista bid asd,  to f/u any worsening symptoms or concerns

## 2019-01-21 NOTE — Progress Notes (Signed)
   Subjective:    Patient ID: Carlos Tapia, male    DOB: November 07, 1970, 48 y.o.   MRN: [id number]  HPI    Review of Systems     Objective:   Physical Exam        Assessment & Plan:

## 2019-01-21 NOTE — Assessment & Plan Note (Signed)
Clinical diagnosis, for albutetrol hfa prn, also symbicort asd,  to f/u any worsening symptoms or concerns

## 2019-01-21 NOTE — Assessment & Plan Note (Signed)
For phentermine x 3 mo only ,  to f/u any worsening symptoms or concerns

## 2019-01-21 NOTE — Progress Notes (Signed)
Patient ID: Carlos Tapia, male   DOB: 1970-06-20, 48 y.o.   MRN: [id number]  Virtual Visit via Video Note  I connected with Carlos Tapia on 01/21/19 at  4:20 PM EDT by a video enabled telemedicine application and verified that I am speaking with the correct person using two identifiers.  Location: Patient: at home Provider: at office   I discussed the limitations of evaluation and management by telemedicine and the availability of in person appointments. The patient expressed understanding and agreed to proceed.  History of Present Illness: Here to f/u with c/o 1.5 yrs of gradually worsening now mild to mod intermittent wheezing and sob assoc with nasal allergy congestion with post nasal gtt and significant wt gain now about 323 lbs per pt  Worse overall in the last 3 days, has not had inhaler use in the past. Flonase no longer helping much for the nasal symptoms.  Pt denies chest pain, orthopnea, PND, increased LE swelling, palpitations, dizziness or syncope.  Pt denies new neurological symptoms such as new headache, or facial or extremity weakness or numbness   Pt denies polydipsia, polyuria. Past Medical History:  Diagnosis Date  . Blood in stool   . Chicken pox   . GERD (gastroesophageal reflux disease)   . Migraines   . Shingles 2009, 2010   No past surgical history on file.  reports that he quit smoking about 2 years ago. His smoking use included cigarettes. He has a 22.50 pack-year smoking history. He has never used smokeless tobacco. He reports current alcohol use. He reports current drug use. Frequency: 7.00 times per week. Drug: Marijuana. family history includes Heart disease in his mother. He was adopted. No Known Allergies Current Outpatient Medications on File Prior to Visit  Medication Sig Dispense Refill  . meloxicam (MOBIC) 15 MG tablet Take 1 tablet (15 mg total) by mouth daily. (Patient not taking: Reported on 01/21/2019) 30 tablet 0   No current  facility-administered medications on file prior to visit.     Observations/Objective: Alert, NAD, appropriate mood and affect, resps normal, cn 2-12 intact, moves all 4s, no visible rash or swelling Lab Results  Component Value Date   WBC 12.2 (H) 11/29/2016   HGB 15.1 11/29/2016   HCT 45.5 11/29/2016   PLT 262.0 11/29/2016   GLUCOSE 104 (H) 11/29/2016   CHOL 182 11/29/2016   TRIG 109.0 11/29/2016   HDL 35.50 (L) 11/29/2016   LDLCALC 125 (H) 11/29/2016   ALT 32 11/29/2016   AST 25 11/29/2016   NA 137 11/29/2016   K 3.8 11/29/2016   CL 103 11/29/2016   CREATININE 0.91 11/29/2016   BUN 16 11/29/2016   CO2 27 11/29/2016   TSH 1.12 11/29/2016   Assessment and Plan: See notes  Follow Up Instructions: See notes   I discussed the assessment and treatment plan with the patient. The patient was provided an opportunity to ask questions and all were answered. The patient agreed with the plan and demonstrated an understanding of the instructions.   The patient was advised to call back or seek an in-person evaluation if the symptoms worsen or if the condition fails to improve as anticipated   Cathlean Cower, MD

## 2019-01-21 NOTE — Patient Instructions (Signed)
Please take all new medication as prescribed  Please continue all other medications as before, and refills have been done if requested.  Please have the pharmacy call with any other refills you may need.  Please continue your efforts at being more active, low cholesterol diet, and weight control.  Please keep your appointments with your specialists as you may have planned    

## 2019-03-06 ENCOUNTER — Encounter: Payer: Self-pay | Admitting: Internal Medicine

## 2019-03-06 ENCOUNTER — Ambulatory Visit (INDEPENDENT_AMBULATORY_CARE_PROVIDER_SITE_OTHER): Payer: 59 | Admitting: Internal Medicine

## 2019-03-06 ENCOUNTER — Other Ambulatory Visit: Payer: Self-pay

## 2019-03-06 DIAGNOSIS — R030 Elevated blood-pressure reading, without diagnosis of hypertension: Secondary | ICD-10-CM

## 2019-03-06 DIAGNOSIS — G5622 Lesion of ulnar nerve, left upper limb: Secondary | ICD-10-CM | POA: Diagnosis not present

## 2019-03-06 DIAGNOSIS — J4531 Mild persistent asthma with (acute) exacerbation: Secondary | ICD-10-CM | POA: Diagnosis not present

## 2019-03-06 MED ORDER — IBUPROFEN 800 MG PO TABS: 800.0000 mg | ORAL_TABLET | Freq: Three times a day (TID) | ORAL | 0 refills | Status: DC | PRN

## 2019-03-06 MED ORDER — PREDNISONE 10 MG PO TABS: 10.0000 mg | ORAL_TABLET | Freq: Every day | ORAL | 0 refills | Status: AC

## 2019-03-06 MED ORDER — HYDROCODONE-ACETAMINOPHEN 5-325 MG PO TABS: 1.0000 | ORAL_TABLET | Freq: Four times a day (QID) | ORAL | 0 refills | Status: DC | PRN

## 2019-03-06 NOTE — Progress Notes (Signed)
Subjective:    Patient ID: Carlos Tapia, male    DOB: 03-19-1971, 48 y.o.   MRN: [id number]  HPI  Here with left elbow pain, burning electric moderate to severe constant; started 1 mo ago with hit the elbow in the groove area to a metal table, then only seems to get worse with working as his work is physical and requires flexion/extension/flexion again at the elbow. Radiates up to the shoulder then down to the hand with some mild weakness.. Pain now about 8/10 could hardly stand it today, had to take day off work, but does not want further time off as he wont be paid.  Pt denies chest pain, increased sob or doe, wheezing, orthopnea, PND, increased LE swelling, palpitations, dizziness or syncope.  Pt denies new neurological symptoms such as new headache, or facial or extremity weakness or numbness except for the above. Pt denies polydipsia, polyuria..    Past Medical History:  Diagnosis Date  . Blood in stool   . Chicken pox   . GERD (gastroesophageal reflux disease)   . Migraines   . Shingles 2009, 2010   No past surgical history on file.  reports that he quit smoking about 2 years ago. His smoking use included cigarettes. He has a 22.50 pack-year smoking history. He has never used smokeless tobacco. He reports current alcohol use. He reports current drug use. Frequency: 7.00 times per week. Drug: Marijuana. family history includes Heart disease in his mother. He was adopted. No Known Allergies Current Outpatient Medications on File Prior to Visit  Medication Sig Dispense Refill  . albuterol (VENTOLIN HFA) 108 (90 Base) MCG/ACT inhaler Inhale 2 puffs into the lungs every 6 (six) hours as needed for wheezing or shortness of breath. 18 g 11  . Azelastine-Fluticasone 137-50 MCG/ACT SUSP 1 spray each side twice per day 23 g 5  . budesonide-formoterol (SYMBICORT) 160-4.5 MCG/ACT inhaler Inhale 2 puffs into the lungs 2 (two) times daily. 1 Inhaler 11  . meloxicam (MOBIC) 15 MG tablet Take 1  tablet (15 mg total) by mouth daily. 30 tablet 0  . phentermine 37.5 MG capsule Take 1 capsule (37.5 mg total) by mouth every morning. 90 capsule 0   No current facility-administered medications on file prior to visit.    Review of Systems  Constitutional: Negative for other unusual diaphoresis or sweats HENT: Negative for ear discharge or swelling Eyes: Negative for other worsening visual disturbances Respiratory: Negative for stridor or other swelling  Gastrointestinal: Negative for worsening distension or other blood Genitourinary: Negative for retention or other urinary change Musculoskeletal: Negative for other MSK pain or swelling Skin: Negative for color change or other new lesions Neurological: Negative for worsening tremors and other numbness  Psychiatric/Behavioral: Negative for worsening agitation or other fatigue All otherwise neg per pt     Objective:   Physical Exam BP 124/78   Pulse 66   Temp 97.7 F (36.5 C) (Oral)   Ht 5\' 10"  (1.778 m)   Wt (!) 319 lb (144.7 kg)   SpO2 98%   BMI 45.77 kg/m  VS noted,  Constitutional: Pt appears in NAD HENT: Head: NCAT.  Right Ear: External ear normal.  Left Ear: External ear normal.  Eyes: . Pupils are equal, round, and reactive to light. Conjunctivae and EOM are normal Nose: without d/c or deformity Neck: Neck supple. Gross normal ROM Cardiovascular: Normal rate and regular rhythm.   Pulmonary/Chest: Effort normal and breath sounds without rales or wheezing.  Left  elbow with marked tender ulnar groove Neurological: Pt is alert. At baseline orientation, motor grossly intact Skin: Skin is warm. No rashes, other new lesions, no LE edema Psychiatric: Pt behavior is normal without agitation  All otherwise neg per pt Lab Results  Component Value Date   WBC 12.2 (H) 11/29/2016   HGB 15.1 11/29/2016   HCT 45.5 11/29/2016   PLT 262.0 11/29/2016   GLUCOSE 104 (H) 11/29/2016   CHOL 182 11/29/2016   TRIG 109.0 11/29/2016    HDL 35.50 (L) 11/29/2016   LDLCALC 125 (H) 11/29/2016   ALT 32 11/29/2016   AST 25 11/29/2016   NA 137 11/29/2016   K 3.8 11/29/2016   CL 103 11/29/2016   CREATININE 0.91 11/29/2016   BUN 16 11/29/2016   CO2 27 11/29/2016   TSH 1.12 11/29/2016        Assessment & Plan:

## 2019-03-06 NOTE — Patient Instructions (Signed)
Please take all new medication as prescribed - the prednisone, ibuprofen and even hydrocodone if needed  You will be contacted regarding the referral for: Neurosurgury for left ulnar neuritis  Please continue all other medications as before, and refills have been done if requested.  Please have the pharmacy call with any other refills you may need.  Please keep your appointments with your specialists as you may have planned

## 2019-03-08 ENCOUNTER — Encounter: Payer: Self-pay | Admitting: Internal Medicine

## 2019-03-08 NOTE — Assessment & Plan Note (Signed)
stable overall by history and exam, recent data reviewed with pt, and pt to continue medical treatment as before,  to f/u any worsening symptoms or concerns  

## 2019-03-08 NOTE — Assessment & Plan Note (Signed)
Mod to severe symptoms, urged off work but he declines, for ibuprofen 800 with prilosec 20 prn, vicodin prn breakthrough pain, predpac asd, and NS referral

## 2019-04-08 DIAGNOSIS — R030 Elevated blood-pressure reading, without diagnosis of hypertension: Secondary | ICD-10-CM | POA: Diagnosis not present

## 2019-04-08 DIAGNOSIS — Z6841 Body Mass Index (BMI) 40.0 and over, adult: Secondary | ICD-10-CM | POA: Diagnosis not present

## 2019-04-08 DIAGNOSIS — M79602 Pain in left arm: Secondary | ICD-10-CM | POA: Diagnosis not present

## 2019-04-25 ENCOUNTER — Ambulatory Visit: Payer: 59 | Admitting: Family

## 2019-04-25 ENCOUNTER — Ambulatory Visit: Payer: Self-pay | Admitting: Internal Medicine

## 2019-05-12 ENCOUNTER — Ambulatory Visit: Payer: BC Managed Care – PPO | Admitting: Family

## 2019-05-12 ENCOUNTER — Other Ambulatory Visit: Payer: Self-pay | Admitting: Family

## 2019-05-12 ENCOUNTER — Encounter: Payer: Self-pay | Admitting: Family

## 2019-05-12 ENCOUNTER — Other Ambulatory Visit: Payer: Self-pay

## 2019-05-12 ENCOUNTER — Ambulatory Visit (INDEPENDENT_AMBULATORY_CARE_PROVIDER_SITE_OTHER): Payer: BC Managed Care – PPO

## 2019-05-12 VITALS — BP 142/90 | HR 82 | Temp 98.3°F | Ht 70.0 in | Wt 321.6 lb

## 2019-05-12 DIAGNOSIS — M5442 Lumbago with sciatica, left side: Secondary | ICD-10-CM

## 2019-05-12 DIAGNOSIS — M545 Low back pain: Secondary | ICD-10-CM | POA: Diagnosis not present

## 2019-05-12 MED ORDER — METHYLPREDNISOLONE ACETATE 40 MG/ML IJ SUSP
40.0000 mg | Freq: Once | INTRAMUSCULAR | Status: AC
  Administered 2019-05-12: 40 mg via INTRAMUSCULAR

## 2019-05-12 MED ORDER — METHOCARBAMOL 500 MG PO TABS: 500.0000 mg | ORAL_TABLET | Freq: Three times a day (TID) | ORAL | 0 refills | Status: DC | PRN

## 2019-05-12 MED ORDER — TRAMADOL HCL 50 MG PO TABS: 50.0000 mg | ORAL_TABLET | Freq: Four times a day (QID) | ORAL | 0 refills | Status: AC | PRN

## 2019-05-12 MED ORDER — ALBUTEROL SULFATE HFA 108 (90 BASE) MCG/ACT IN AERS: 2.0000 | INHALATION_SPRAY | Freq: Four times a day (QID) | RESPIRATORY_TRACT | 1 refills | Status: DC | PRN

## 2019-05-12 MED ORDER — METHYLPREDNISOLONE 4 MG PO TBPK: ORAL_TABLET | ORAL | 0 refills | Status: DC

## 2019-05-12 NOTE — Progress Notes (Signed)
Carlos Tapia is a 49 y.o. male with the following history as recorded in Epic[facility]:  Patient Active Problem List   Diagnosis Date Noted  . Ulnar neuritis, left 03/06/2019  . Allergic rhinitis 01/21/2019  . Asthma 01/21/2019  . Rupture of plantaris tendon, left, initial encounter 03/15/2017  . Hypersomnolence 11/28/2016  . Hematochezia 11/28/2016  . Morbid obesity (HCC) 09/06/2015  . Acute sinus infection 05/19/2015  . Insomnia 08/19/2014  . Fatigue 08/19/2014  . Blood pressure elevated without history of HTN 08/19/2014    Current Outpatient Medications  Medication Sig Dispense Refill  . albuterol (VENTOLIN HFA) 108 (90 Base) MCG/ACT inhaler Inhale 2 puffs into the lungs every 6 (six) hours as needed for wheezing or shortness of breath. 18 g 11  . Azelastine-Fluticasone 137-50 MCG/ACT SUSP 1 spray each side twice per day 23 g 5  . budesonide-formoterol (SYMBICORT) 160-4.5 MCG/ACT inhaler Inhale 2 puffs into the lungs 2 (two) times daily. 1 Inhaler 11  . ibuprofen (ADVIL) 800 MG tablet Take 1 tablet (800 mg total) by mouth every 8 (eight) hours as needed. 60 tablet 0  . HYDROcodone-acetaminophen (NORCO/VICODIN) 5-325 MG tablet Take 1 tablet by mouth every 6 (six) hours as needed for moderate pain or severe pain. (Patient not taking: Reported on 05/12/2019) 30 tablet 0  . meloxicam (MOBIC) 15 MG tablet Take 1 tablet (15 mg total) by mouth daily. (Patient not taking: Reported on 05/12/2019) 30 tablet 0  . methocarbamol (ROBAXIN) 500 MG tablet Take 1 tablet (500 mg total) by mouth every 8 (eight) hours as needed. 30 tablet 0  . methylPREDNISolone (MEDROL DOSEPAK) 4 MG TBPK tablet Taper as directed 21 tablet 0  . phentermine 37.5 MG capsule Take 1 capsule (37.5 mg total) by mouth every morning. (Patient not taking: Reported on 05/12/2019) 90 capsule 0  . traMADol (ULTRAM) 50 MG tablet Take 1 tablet (50 mg total) by mouth every 6 (six) hours as needed for up to 5 days. 20 tablet 0   No current  facility-administered medications for this visit.    Allergies: Patient has no known allergies.  Past Medical History:  Diagnosis Date  . Blood in stool   . Chicken pox   . GERD (gastroesophageal reflux disease)   . Migraines   . Shingles 2009, 2010    History reviewed. No pertinent surgical history.  Family History  Adopted: Yes  Problem Relation Age of Onset  . Heart disease Mother   . Colon cancer Neg Hx     Social History   Tobacco Use  . Smoking status: Former Smoker    Packs/day: 0.75    Years: 30.00    Pack years: 22.50    Types: Cigarettes    Quit date: 03/30/2016    Years since quitting: 3.1  . Smokeless tobacco: Never Used  Substance Use Topics  . Alcohol use: Yes    Comment: rarely    Subjective:  LBP x 5 days; pain radiating into left leg; notes he has had problems with his back "on and off" for years but not this extreme; notes he has not had any specific injury or trauma; Notes that job is physical in nature; has used Advil with limited benefit; no concerns for bowel or bladder changes;      Objective:  Vitals:   05/12/19 1422  BP: (!) 142/90  Pulse: 82  Temp: 98.3 F (36.8 C)  TempSrc: Oral  SpO2: 97%  Weight: (!) 321 lb 9.6 oz (145.9 kg)  Height: 5\' 10"  (1.778 m)    General: Well developed, well nourished, in no acute distress  Skin : Warm and dry.  Head: Normocephalic and atraumatic  Lungs: Respirations unlabored; clear to auscultation bilaterally without wheeze, rales, rhonchi  Musculoskeletal: No deformities; no active joint inflammation  Extremities: No edema, cyanosis, clubbing  Vessels: Symmetric bilaterally  Neurologic: Alert and oriented; speech intact; face symmetrical; moves all extremities well; CNII-XII intact without focal deficit   Assessment:  1. Acute left-sided low back pain with left-sided sciatica     Plan:  Depo-Medrol IM 40 mg given in office today; will update lumbar Xray today; Rx for Medrol Dose Pak to start  tomorrow; Rx for Robaxin and Tramadol to use as well; continue to rest and apply heat- work note through Wednesday- may need to extend; follow-up to be determined based on Xray results/ may need MRI and/ or follow-up with sports medicine.  This visit occurred during the SARS-CoV-2 public health emergency.  Safety protocols were in place, including screening questions prior to the visit, additional usage of staff PPE, and extensive cleaning of exam room while observing appropriate contact time as indicated for disinfecting solutions.      No follow-ups on file.  Orders Placed This Encounter  Procedures  . DG Lumbar Spine 2-3 Views    Standing Status:   Future    Standing Expiration Date:   07/09/2020    Order Specific Question:   Reason for Exam (SYMPTOM  OR DIAGNOSIS REQUIRED)    Answer:   low back pain    Order Specific Question:   Preferred imaging location?    Answer:   [name]    Order Specific Question:   Radiology Contrast Protocol - do NOT remove file path    Answer:   [email or web address]    Requested Prescriptions    No prescriptions requested or ordered in this encounter

## 2019-05-13 ENCOUNTER — Telehealth: Payer: Self-pay | Admitting: Internal Medicine

## 2019-05-13 NOTE — Telephone Encounter (Signed)
    Please return call to patient to discuss imaging results 

## 2019-05-13 NOTE — Telephone Encounter (Signed)
Request for rx of the Ibuprofen 800 mg and additional days out of work (return on Monday).   Spoke to pt spouse (on DRP; Tiffany) and informed of xray results. Pt spouse stated understanding.

## 2019-05-14 ENCOUNTER — Other Ambulatory Visit: Payer: Self-pay | Admitting: Family

## 2019-05-14 MED ORDER — IBUPROFEN 800 MG PO TABS: 800.0000 mg | ORAL_TABLET | Freq: Three times a day (TID) | ORAL | 0 refills | Status: DC | PRN

## 2019-05-14 NOTE — Telephone Encounter (Signed)
Spoke with patient today and info given. 

## 2019-05-14 NOTE — Telephone Encounter (Signed)
Work note and RX have been sent; He needs to limit the use of Ibuprofen while he is on the steroid pack however.

## 2019-06-10 ENCOUNTER — Ambulatory Visit: Payer: BC Managed Care – PPO | Admitting: Internal Medicine

## 2019-06-10 ENCOUNTER — Encounter: Payer: Self-pay | Admitting: Internal Medicine

## 2019-06-10 ENCOUNTER — Other Ambulatory Visit: Payer: Self-pay

## 2019-06-10 VITALS — BP 138/94 | HR 88 | Temp 98.4°F | Ht 70.0 in | Wt 319.0 lb

## 2019-06-10 DIAGNOSIS — J4531 Mild persistent asthma with (acute) exacerbation: Secondary | ICD-10-CM

## 2019-06-10 DIAGNOSIS — R030 Elevated blood-pressure reading, without diagnosis of hypertension: Secondary | ICD-10-CM | POA: Diagnosis not present

## 2019-06-10 DIAGNOSIS — M5416 Radiculopathy, lumbar region: Secondary | ICD-10-CM | POA: Insufficient documentation

## 2019-06-10 MED ORDER — KETOROLAC TROMETHAMINE 30 MG/ML IJ SOLN
30.0000 mg | Freq: Once | INTRAMUSCULAR | Status: AC
  Administered 2019-06-10: 17:00:00 30 mg via INTRAMUSCULAR

## 2019-06-10 MED ORDER — METHYLPREDNISOLONE 4 MG PO TBPK: ORAL_TABLET | ORAL | 0 refills | Status: DC

## 2019-06-10 MED ORDER — HYDROCODONE-ACETAMINOPHEN 7.5-325 MG PO TABS: 1.0000 | ORAL_TABLET | Freq: Four times a day (QID) | ORAL | 0 refills | Status: DC | PRN

## 2019-06-10 MED ORDER — GABAPENTIN 100 MG PO CAPS: 100.0000 mg | ORAL_CAPSULE | Freq: Three times a day (TID) | ORAL | 3 refills | Status: DC

## 2019-06-10 MED ORDER — CYCLOBENZAPRINE HCL 5 MG PO TABS: 5.0000 mg | ORAL_TABLET | Freq: Three times a day (TID) | ORAL | 1 refills | Status: DC | PRN

## 2019-06-10 NOTE — Assessment & Plan Note (Addendum)
Persistent with worsening, for hydrocodone prn, flexeril prn, gabapentin 100 tid, predpac asd, and MRI LS Spine, consider surgical referral  I spent 31 minutes in preparing to see the patient by review of recent labs, imaging and procedures, obtaining and reviewing separately obtained history, communicating with the patient and family or caregiver, ordering medications, tests or procedures, and documenting clinical information in the EHR including the differential Dx, treatment, and any further evaluation and other management of left lumbar radiculopathy, asthma, elevated BP

## 2019-06-10 NOTE — Progress Notes (Signed)
Subjective:    Patient ID: Carlos Tapia, male    DOB: June 18, 1970, 49 y.o.   MRN: [id number]  HPI  Here with 4 wks onset persistent left LBP, dull, mod to severe, constant but clearly worse with sitting, better with standing even at work, with some radiation to left side and left groin area as well as mild LLE weakness, not better with one wk off work.  Seen recently with neg plain films, tx with muscle relaxer, prednisone, tramadol, seemed somewhat better, then worse again with a vengence left side with radiation to the groin area.  Heating pad helps for mintues only.  Pt has no bowel or bladder change, fever, wt loss, or falls.   Pt denies polydipsia, polyuria, Past Medical History:  Diagnosis Date  . Blood in stool   . Chicken pox   . GERD (gastroesophageal reflux disease)   . Migraines   . Shingles 2009, 2010   No past surgical history on file.  reports that he quit smoking about 3 years ago. His smoking use included cigarettes. He has a 22.50 pack-year smoking history. He has never used smokeless tobacco. He reports current alcohol use. He reports current drug use. Frequency: 7.00 times per week. Drug: Marijuana. family history includes Heart disease in his mother. He was adopted. No Known Allergies Current Outpatient Medications on File Prior to Visit  Medication Sig Dispense Refill  . albuterol (VENTOLIN HFA) 108 (90 Base) MCG/ACT inhaler Inhale 2 puffs into the lungs every 6 (six) hours as needed for wheezing or shortness of breath. 18 g 1  . Azelastine-Fluticasone 137-50 MCG/ACT SUSP 1 spray each side twice per day 23 g 5  . budesonide-formoterol (SYMBICORT) 160-4.5 MCG/ACT inhaler Inhale 2 puffs into the lungs 2 (two) times daily. 1 Inhaler 11  . ibuprofen (ADVIL) 800 MG tablet Take 1 tablet (800 mg total) by mouth every 8 (eight) hours as needed. 60 tablet 0   No current facility-administered medications on file prior to visit.   Review of Systems All otherwise neg per pt     Objective:   Physical Exam BP (!) 138/94   Pulse 88   Temp 98.4 F (36.9 C)   Ht 5\' 10"  (1.778 m)   Wt (!) 319 lb (144.7 kg)   SpO2 99%   BMI 45.77 kg/m  VS noted,  Constitutional: Pt appears in NAD HENT: Head: NCAT.  Right Ear: External ear normal.  Left Ear: External ear normal.  Eyes: . Pupils are equal, round, and reactive to light. Conjunctivae and EOM are normal Nose: without d/c or deformity Neck: Neck supple. Gross normal ROM Cardiovascular: Normal rate and regular rhythm.   Pulmonary/Chest: Effort normal and breath sounds without rales or wheezing.  Abd:  Soft, NT, ND, + BS, no organomegaly Spine mild tender midline and left paraspinal without sweling or rash Neurological: Pt is alert. At baseline orientation, motor grossly intact except for LLE with 4+/5 motor Skin: Skin is warm. No rashes, other new lesions, no LE edema Psychiatric: Pt behavior is normal without agitation  All otherwise neg per pt Lab Results  Component Value Date   WBC 12.2 (H) 11/29/2016   HGB 15.1 11/29/2016   HCT 45.5 11/29/2016   PLT 262.0 11/29/2016   GLUCOSE 104 (H) 11/29/2016   CHOL 182 11/29/2016   TRIG 109.0 11/29/2016   HDL 35.50 (L) 11/29/2016   LDLCALC 125 (H) 11/29/2016   ALT 32 11/29/2016   AST 25 11/29/2016   NA 137  11/29/2016   K 3.8 11/29/2016   CL 103 11/29/2016   CREATININE 0.91 11/29/2016   BUN 16 11/29/2016   CO2 27 11/29/2016   TSH 1.12 11/29/2016       Assessment & Plan:

## 2019-06-10 NOTE — Assessment & Plan Note (Signed)
Mild, likeley reactive, continue to follow BP at home and next visit

## 2019-06-10 NOTE — Assessment & Plan Note (Signed)
stable overall by history and exam, recent data reviewed with pt, and pt to continue medical treatment as before,  to f/u any worsening symptoms or concerns  

## 2019-06-10 NOTE — Patient Instructions (Signed)
You had the pain shot today (toradol)  Please take all new medication as prescribed  - the hydrocodone for pain, flexeril generic for muscle relaxer, and gabapentin for pain  Please continue all other medications as before, including a refill of the prednisone  Please have the pharmacy call with any other refills you may need.  Please keep your appointments with your specialists as you may have planned  You will be contacted regarding the referral for: MRI

## 2019-07-11 ENCOUNTER — Ambulatory Visit
Admission: RE | Admit: 2019-07-11 | Discharge: 2019-07-11 | Disposition: A | Discharge status: Home / Self Care | Payer: BC Managed Care – PPO | Source: Ambulatory Visit | Attending: Internal Medicine | Admitting: Internal Medicine

## 2019-07-11 DIAGNOSIS — M5416 Radiculopathy, lumbar region: Secondary | ICD-10-CM

## 2019-07-14 ENCOUNTER — Telehealth: Payer: Self-pay

## 2019-07-14 MED ORDER — DIAZEPAM 5 MG PO TABS: ORAL_TABLET | ORAL | 0 refills | Status: DC

## 2019-07-14 MED ORDER — BUDESONIDE-FORMOTEROL FUMARATE 160-4.5 MCG/ACT IN AERO: 2.0000 | INHALATION_SPRAY | Freq: Two times a day (BID) | RESPIRATORY_TRACT | 11 refills | Status: DC

## 2019-07-14 NOTE — Telephone Encounter (Signed)
Pt spouse contacted and informed rx has been sent. She stated that the MRI has been rescheduled.

## 2019-07-14 NOTE — Telephone Encounter (Signed)
Ok for valium 5 mg x 1 prior to procedure - done erx  Ok to reschedule MRI

## 2019-07-14 NOTE — Telephone Encounter (Signed)
New message    The patient was previous scheduled for MRI patient pain and a test was not performed.   Asking for valium or some sort of medication to help him relax.  MRI has not been rescheduled yet   [facility] [address] [place], [place] - [address]

## 2019-08-13 ENCOUNTER — Other Ambulatory Visit: Payer: BC Managed Care – PPO

## 2019-08-16 ENCOUNTER — Ambulatory Visit
Admission: RE | Admit: 2019-08-16 | Discharge: 2019-08-16 | Disposition: A | Discharge status: Home / Self Care | Payer: BC Managed Care – PPO | Source: Ambulatory Visit | Attending: Internal Medicine | Admitting: Internal Medicine

## 2019-08-16 ENCOUNTER — Other Ambulatory Visit: Payer: Self-pay

## 2019-08-16 DIAGNOSIS — M545 Low back pain: Secondary | ICD-10-CM | POA: Diagnosis not present

## 2019-08-16 DIAGNOSIS — M5416 Radiculopathy, lumbar region: Secondary | ICD-10-CM

## 2019-08-20 ENCOUNTER — Telehealth: Payer: Self-pay

## 2019-08-20 NOTE — Telephone Encounter (Signed)
New message    Calling for MRI test results

## 2019-08-21 NOTE — Telephone Encounter (Signed)
Tried calling pt/wife back no answer couldn't leave vm due to vm not set-up.Marland KitchenRaechel Chute

## 2019-08-21 NOTE — Telephone Encounter (Signed)
Wife called back gave her MD response concerning MRI results. Will call [facility] for appt.Marland KitchenRaechel Chute

## 2019-08-21 NOTE — Telephone Encounter (Signed)
New message:   Pt's wife is calling to get husband's MRI results. I see where Dr. Jonny Ruiz sent a mychart message but she states they didn't receive one. Please advise.

## 2020-04-07 ENCOUNTER — Telehealth: Payer: Self-pay | Admitting: Internal Medicine

## 2020-04-07 NOTE — Telephone Encounter (Signed)
Patients wife Elmarie Shiley calling stating that the patient is experiencing some pain in his shoulder that is radiating down his back and also causing chest pain, he is also experiencing some acid reflux. Transferred to [facility] for further eval

## 2020-04-07 NOTE — Telephone Encounter (Signed)
See previous note; [facility] RN transferred wife to office.  Tiffany states pt injured his right shoulder x 1 week ago that has increasingly gotten worse with pain; pain in chest that radiates to back & in shoulder.  Wife is very concerned b/c she has had a heart attack before & her husband has risk factors.  States pt also c/o new onset heartburn x 3-4days.  Pt is refusing ED, stating he cannot miss work, but states he will come for OV after work.  Declines appts this afternoon with Dr Jonny Ruiz, but states he will be able to make appt tomorrow at 3:40 with Dr Macario Golds (this is confirmed through wife who was communicating with him via text message).  Wife denies h/a, fever, cough, congestion at this time.

## 2020-04-08 ENCOUNTER — Encounter: Payer: Self-pay | Admitting: Internal Medicine

## 2020-04-08 ENCOUNTER — Other Ambulatory Visit: Payer: Self-pay

## 2020-04-08 ENCOUNTER — Ambulatory Visit: Payer: BC Managed Care – PPO | Admitting: Internal Medicine

## 2020-04-08 VITALS — BP 144/86 | HR 94 | Temp 98.6°F | Wt 327.6 lb

## 2020-04-08 DIAGNOSIS — G4730 Sleep apnea, unspecified: Secondary | ICD-10-CM | POA: Insufficient documentation

## 2020-04-08 DIAGNOSIS — M25511 Pain in right shoulder: Secondary | ICD-10-CM | POA: Diagnosis not present

## 2020-04-08 DIAGNOSIS — K219 Gastro-esophageal reflux disease without esophagitis: Secondary | ICD-10-CM

## 2020-04-08 DIAGNOSIS — J309 Allergic rhinitis, unspecified: Secondary | ICD-10-CM | POA: Diagnosis not present

## 2020-04-08 DIAGNOSIS — G4710 Hypersomnia, unspecified: Secondary | ICD-10-CM

## 2020-04-08 DIAGNOSIS — R030 Elevated blood-pressure reading, without diagnosis of hypertension: Secondary | ICD-10-CM | POA: Diagnosis not present

## 2020-04-08 DIAGNOSIS — J4531 Mild persistent asthma with (acute) exacerbation: Secondary | ICD-10-CM

## 2020-04-08 DIAGNOSIS — G8929 Other chronic pain: Secondary | ICD-10-CM

## 2020-04-08 MED ORDER — VALSARTAN 80 MG PO TABS: 80.0000 mg | ORAL_TABLET | Freq: Every day | ORAL | 11 refills | Status: DC

## 2020-04-08 MED ORDER — METHYLPREDNISOLONE 4 MG PO TBPK: ORAL_TABLET | ORAL | 0 refills | Status: DC

## 2020-04-08 MED ORDER — PSEUDOEPHEDRINE HCL ER 120 MG PO TB12: 120.0000 mg | ORAL_TABLET | Freq: Two times a day (BID) | ORAL | 1 refills | Status: AC | PRN

## 2020-04-08 MED ORDER — ESOMEPRAZOLE MAGNESIUM 40 MG PO CPDR: 40.0000 mg | DELAYED_RELEASE_CAPSULE | Freq: Every day | ORAL | 5 refills | Status: DC

## 2020-04-08 MED ORDER — TRAMADOL HCL 50 MG PO TABS: 50.0000 mg | ORAL_TABLET | Freq: Four times a day (QID) | ORAL | 0 refills | Status: AC | PRN

## 2020-04-08 MED ORDER — VITAMIN D3 50 MCG (2000 UT) PO CAPS: 2000.0000 [IU] | ORAL_CAPSULE | Freq: Every day | ORAL | 3 refills | Status: DC

## 2020-04-08 NOTE — Assessment & Plan Note (Signed)
He is using Symbicort inhaler periodically

## 2020-04-08 NOTE — Assessment & Plan Note (Signed)
Weight loss discussed  

## 2020-04-08 NOTE — Assessment & Plan Note (Signed)
Clinically and according to his wife he does have sleep apnea.  She was advised to consider a sleep test.  He needs to lose weight.  Follow-up with Dr. Jonny Ruiz

## 2020-04-08 NOTE — Assessment & Plan Note (Addendum)
Worse Sudafed prn.  Risks of using Sudafed discussed.  He needs to take Diovan if his blood pressure starts to go up. Treat GERD

## 2020-04-08 NOTE — Assessment & Plan Note (Addendum)
R shoulder pain of 1 month duration-he lifted something heavy off the floor when he heard a pop, he had a severe sudden pain and lost strength in the right arm and shoulder by 50% C/o debilitating pain at work.  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 the pain is severe at night as well.  He has a history of chronic pain in both shoulders.  The pain in the right shoulder now is excruciating   Worse.  Will take him out of work-he is unable to work due to severe pain.  Suspect right shoulder rotator cuff tear.  Tramadol as needed.  Ice.  Potential benefits of a short term opioids use as well as potential risks (i.e. addiction risk, apnea etc) and complications (i.e. Somnolence, constipation and others) were explained to the patient and were aknowledged. Orthopedic consultation with Dr. Dion Saucier

## 2020-04-08 NOTE — Patient Instructions (Signed)
Rotator Cuff Tear ° °A rotator cuff tear is a partial or complete tear of the cord-like bands (tendons) that connect muscle to bone in the rotator cuff. The rotator cuff is a group of muscles and tendons that surround the shoulder joint and keep the upper arm bone (humerus) in the shoulder socket. °The tear can occur suddenly (acute tear) or can develop over a long period of time (chronic tear). °What are the causes? °Acute tears may be caused by: °· A fall, especially on an outstretched arm. °· Lifting very heavy objects with a jerking motion. °Chronic tears may be caused by overuse of the muscles. This may happen in sports, physical work, or activities in which your arm repeatedly moves over your head. °What increases the risk? °This condition is more likely to occur in: °· Athletes and workers who frequently use their shoulder or reach over their heads. This may include activities such as: °? Tennis. °? Baseball and softball. °? Swimming and rowing. °? Weightlifting. °? Construction work. °? Painting. °· People who smoke. °· Older people who have arthritis or poor blood supply. These can make the muscles and tendons weaker. °What are the signs or symptoms? °Symptoms of this condition depend on the type and severity of the injury: °· An acute tear may include a sudden tearing feeling, followed by severe pain that goes from your upper shoulder, down your arm, and toward your elbow. °· A chronic tear includes a gradual weakness and decreased shoulder motion as the pain gets worse. The pain is usually worse at night. °Both types may have symptoms such as: °· Pain that spreads (radiates) from the shoulder to the upper arm. °· Swelling and tenderness in front of the shoulder. °· Decreased range of motion. °· Pain when: °? Reaching, pulling, or lifting the arm above the head. °? Lowering the arm from above the head. °· Not being able to raise your arm out to the side. °· Difficulty placing the arm behind your back. °How  is this diagnosed? °This condition is diagnosed with a medical history and physical exam. Imaging tests may also be done, including: °· X-rays. °· MRI. °· Ultrasound. °· CT or MR arthrogram. During this test, a contrast material is injected into your shoulder and then images are taken. °How is this treated? °Treatment for this condition depends on the type and severity of the condition. In less severe cases, treatment may include: °· Rest. This may be done with a sling that holds the shoulder still (immobilization). Your health care provider may also recommend avoiding activities that involve lifting your arm over your head. °· Icing the shoulder. °· Anti-inflammatory medicines, such as aspirin or ibuprofen. °· Strengthening and stretching exercises. Your health care provider may recommend specific exercises to improve your range of motion and strengthen your shoulder. °In more severe cases, treatment may include: °· Physical therapy. °· Steroid injections. °· Surgery. °Follow these instructions at home: °Managing pain, stiffness, and swelling °· If directed, put ice on the injured area. °? If you have a removable sling, remove it as told by your health care provider. °? Put ice in a plastic bag. °? Place a towel between your skin and the bag. °? Leave the ice on for 20 minutes, 2-3 times a day. °· Raise (elevate) the injured area above the level of your heart while you are lying down. °· Find a comfortable sleeping position or sleep on a recliner, if available. °· Move your fingers often to avoid stiffness   and to lessen swelling. °· Once the swelling has gone down, your health care provider may direct you to apply heat to relax the muscles. Use the heat source that your health care provider recommends, such as a moist heat pack or a heating pad. °? Place a towel between your skin and the heat source. °? Leave the heat on for 20-30 minutes. °? Remove the heat if your skin turns bright red. This is especially  important if you are unable to feel pain, heat, or cold. You may have a greater risk of getting burned. °If you have a sling: °· Wear the sling as told by your health care provider. Remove it only as told by your health care provider. °· Loosen the sling if your fingers tingle, become numb, or turn cold and blue. °· Keep the sling clean. °· If the sling is not waterproof: °? Do not let it get wet. °? Cover it with a watertight covering when you take a bath or a shower. °Driving °· Do not drive or use heavy machinery while taking prescription pain medicine. °· Ask your health care provider when it is safe to drive if you have a sling on your arm. °Activity °· Rest your shoulder as told by your health care provider. °· Return to your normal activities as told by your health care provider. Ask your health care provider what activities are safe for you. °· Do any exercises or stretches as told by your health care provider. °General instructions °· Do not use any products that contain nicotine or tobacco, such as cigarettes and e-cigarettes. If you need help quitting, ask your health care provider. °· Take over-the-counter and prescription medicines only as told by your health care provider. °· Keep all follow-up visits as told by your health care provider. This is important. °Contact a health care provider if: °· Your pain gets worse. °· You have new pain in your arm, hands, or fingers. °· Medicine does not help your pain. °Get help right away if: °· Your arm, hand, or fingers are numb or tingling. °· Your arm, hand, or fingers are swollen or painful or they turn white or blue. °· Your hand or fingers on your injured arm are colder than your other hand. °Summary °· A rotator cuff tear is a partial or complete tear of the cord-like bands (tendons) that connect muscle to bone in the rotator cuff. °· The tear can occur suddenly (acute tear) or can develop over a long period of time (chronic tear). °· Treatment generally  includes rest, anti-inflammatory medicines, and icing. In some cases, physical therapy and steroid injections may be needed. In severe cases, surgery may be needed. °This information is not intended to replace advice given to you by your health care provider. Make sure you discuss any questions you have with your health care provider. °Document Revised: 03/02/2017 Document Reviewed: 06/05/2016 °Elsevier Patient Education © 2020 [employer] ° °

## 2020-04-08 NOTE — Assessment & Plan Note (Signed)
New.  Prescribed Nexium 40 mg daily

## 2020-04-08 NOTE — Assessment & Plan Note (Addendum)
Weight loss discussed  Diovan 80  mg to start if SBP>145

## 2020-04-08 NOTE — Progress Notes (Signed)
Subjective:  Patient ID: Carlos Tapia, male    DOB: 1971/01/17  Age: 50 y.o. MRN: [id number]  CC: Shoulder Pain (Pt states he was not having chest pain. He states he hurst his shoulder (R) side abt two months. Since then been having a lot of pain, and not been able to move to do his job at work)   HPI Carlos Tapia presents for R shoulder pain of 1 month duration-he lifted something heavy off the floor when he heard a pop, he had a severe sudden pain and lost strength in the right arm and shoulder by 50% C/o debilitating pain at work.  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 the pain is severe at night as well.  He has a history of chronic pain in both shoulders.  The pain in the right shoulder now is excruciating   C/o heartburn -it is worse than before C/o severe sinus congestion - worse lately, unable to breathe, choking on secretions He has been snoring He has gained 60 pounds since he quit smoking 2 to 3 years ago Is complaining of chronic insomnia He is here with his wife Carlos Tapia who helps him with his history  Outpatient Medications Prior to Visit  Medication Sig Dispense Refill  . albuterol (VENTOLIN HFA) 108 (90 Base) MCG/ACT inhaler Inhale 2 puffs into the lungs every 6 (six) hours as needed for wheezing or shortness of breath. 18 g 1  . Azelastine-Fluticasone 137-50 MCG/ACT SUSP 1 spray each side twice per day 23 g 5  . budesonide-formoterol (SYMBICORT) 160-4.5 MCG/ACT inhaler Inhale 2 puffs into the lungs 2 (two) times daily. 1 Inhaler 11  . cyclobenzaprine (FLEXERIL) 5 MG tablet Take 1 tablet (5 mg total) by mouth 3 (three) times daily as needed for muscle spasms. 30 tablet 1  . gabapentin (NEURONTIN) 100 MG capsule Take 1 capsule (100 mg total) by mouth 3 (three) times daily. 90 capsule 3  . HYDROcodone-acetaminophen (NORCO) 7.5-325 MG tablet Take 1 tablet by mouth every 6 (six) hours as needed for moderate pain.  30 tablet 0  . ibuprofen (ADVIL) 800 MG tablet Take 1 tablet (800 mg total) by mouth every 8 (eight) hours as needed. 60 tablet 0  . diazepam (VALIUM) 5 MG tablet 1 tab by mouth 1 hr prior to procedure 1 tablet 0  . methylPREDNISolone (MEDROL DOSEPAK) 4 MG TBPK tablet Taper as directed 21 tablet 0   No facility-administered medications prior to visit.    ROS: Review of Systems  Constitutional: Positive for fatigue and unexpected weight change. Negative for appetite change.  HENT: Positive for congestion, postnasal drip, rhinorrhea and sinus pressure. Negative for nosebleeds, sneezing, sore throat and trouble swallowing.   Eyes: Negative for itching and visual disturbance.  Respiratory: Positive for shortness of breath. Negative for cough.   Cardiovascular: Negative for chest pain, palpitations and leg swelling.  Gastrointestinal: Negative for abdominal distention, blood in stool, diarrhea and nausea.  Genitourinary: Negative for frequency and hematuria.  Musculoskeletal: Positive for arthralgias and back pain. Negative for gait problem, joint swelling and neck pain.  Skin: Negative for rash.  Neurological: Negative for dizziness, tremors, speech difficulty and weakness.  Psychiatric/Behavioral: Negative for agitation, dysphoric mood, sleep disturbance and suicidal ideas. The patient is not nervous/anxious.     Objective:  BP (!) 144/86 (BP Location: Left Arm)   Pulse 94   Temp 98.6 F (37 C) (Oral)  Wt (!) 327 lb 9.6 oz (148.6 kg)   SpO2 96%   BMI 47.01 kg/m   BP Readings from Last 3 Encounters:  04/08/20 (!) 144/86  06/10/19 (!) 138/94  05/12/19 (!) 142/90    Wt Readings from Last 3 Encounters:  04/08/20 (!) 327 lb 9.6 oz (148.6 kg)  06/10/19 (!) 319 lb (144.7 kg)  05/12/19 (!) 321 lb 9.6 oz (145.9 kg)    Physical Exam Constitutional:      General: He is not in acute distress.    Appearance: He is well-developed. He is obese.     Comments: NAD  HENT:      Mouth/Throat:     Mouth: Oropharynx is clear and moist.  Eyes:     Conjunctiva/sclera: Conjunctivae normal.     Pupils: Pupils are equal, round, and reactive to light.  Neck:     Thyroid: No thyromegaly.     Vascular: No JVD.  Cardiovascular:     Rate and Rhythm: Normal rate and regular rhythm.     Pulses: Intact distal pulses.     Heart sounds: Normal heart sounds. No murmur heard. No friction rub. No gallop.   Pulmonary:     Effort: Pulmonary effort is normal. No respiratory distress.     Breath sounds: Normal breath sounds. No wheezing or rales.  Chest:     Chest wall: No tenderness.  Abdominal:     General: Bowel sounds are normal. There is no distension.     Palpations: Abdomen is soft. There is no mass.     Tenderness: There is no abdominal tenderness. There is no guarding or rebound.  Musculoskeletal:        General: Tenderness present. No edema. Normal range of motion.     Cervical back: Normal range of motion.  Lymphadenopathy:     Cervical: No cervical adenopathy.  Skin:    General: Skin is warm and dry.     Findings: No rash.  Neurological:     Mental Status: He is alert and oriented to person, place, and time.     Cranial Nerves: No cranial nerve deficit.     Motor: No abnormal muscle tone.     Coordination: He displays a negative Romberg sign. Coordination normal.     Gait: Gait normal.     Deep Tendon Reflexes: Reflexes are normal and symmetric.  Psychiatric:        Mood and Affect: Mood and affect normal.        Behavior: Behavior normal.        Thought Content: Thought content normal.        Judgment: Judgment normal.    Left shoulder is painful on range of motion Right shoulder is extremely painful with minimal range of motion, positive signs of rotator cuff tear present.  Muscle strength is hard to assess due to pain bicipital tendon on the right is tender as well. R shoulder - very tender     A total time of >45 minutes was spent preparing to see  the patient, reviewing tests, x-rays, operative reports and outside records.  Also, obtaining history and performing comprehensive physical exam.  Additionally, counseling the patient regarding the above listed issues -rotator cuff tear, GERD, probable sleep apnea.   Finally, documenting clinical information in the health records, coordination of care, educating the patient. It is a complex case.  Lab Results  Component Value Date   WBC 12.2 (H) 11/29/2016   HGB 15.1 11/29/2016   HCT  45.5 11/29/2016   PLT 262.0 11/29/2016   GLUCOSE 104 (H) 11/29/2016   CHOL 182 11/29/2016   TRIG 109.0 11/29/2016   HDL 35.50 (L) 11/29/2016   LDLCALC 125 (H) 11/29/2016   ALT 32 11/29/2016   AST 25 11/29/2016   NA 137 11/29/2016   K 3.8 11/29/2016   CL 103 11/29/2016   CREATININE 0.91 11/29/2016   BUN 16 11/29/2016   CO2 27 11/29/2016   TSH 1.12 11/29/2016    MR LUMBAR SPINE WO CONTRAST  Result Date: 08/18/2019 CLINICAL DATA:  Low back pain with gluteal and groin pain for 4 months. EXAM: MRI LUMBAR SPINE WITHOUT CONTRAST TECHNIQUE: Multiplanar, multisequence MR imaging of the lumbar spine was performed. No intravenous contrast was administered. COMPARISON:  None. FINDINGS: Segmentation:  5 lumbar type vertebrae Alignment:  Normal Vertebrae:  No fracture, evidence of discitis, or bone lesion. Conus medullaris and cauda equina: Conus extends to the L1 level. Conus and cauda equina appear normal. Paraspinal and other soft tissues: Negative Disc levels: T12- L1: Unremarkable. L1-L2: Mild disc narrowing with ventral spondylitic spurring. No impingement L2-L3: Mild spondylitic spurring and mild disc narrowing with bulge. No neural impingement L3-L4: Degenerative facet spurring asymmetric to the right. No impingement L4-L5: Disc narrowing and desiccation with bulge and shallow central protrusion. There is a shallow central protrusion. Degenerative facet spurring on both sides. No neural compression L5-S1:Disc  narrowing with shallow central protrusion based on sagittal images. Degenerative facet spurring on both sides. 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 No neural impingement. Electronically Signed   By: Monte Fantasia M.D.   On: 08/18/2019 07:39    Assessment & Plan:   There are no diagnoses linked to this encounter.   No orders of the defined types were placed in this encounter.    Follow-up: No follow-ups on file.  Walker Kehr, MD

## 2020-04-09 ENCOUNTER — Telehealth: Payer: Self-pay | Admitting: Internal Medicine

## 2020-04-09 NOTE — Telephone Encounter (Signed)
Patient dropped off FMLA forms. Leave start : 04/09/2020 to 04/24/2020. LOV: 04/08/2020  Forms have been completed &Placed in providers box to review and sign.

## 2020-04-12 ENCOUNTER — Other Ambulatory Visit: Payer: Self-pay | Admitting: Orthopedic Surgery

## 2020-04-12 DIAGNOSIS — M25511 Pain in right shoulder: Secondary | ICD-10-CM | POA: Diagnosis not present

## 2020-04-15 DIAGNOSIS — Z0279 Encounter for issue of other medical certificate: Secondary | ICD-10-CM

## 2020-04-15 NOTE — Telephone Encounter (Signed)
Forms have been signed, Faxed to [employer] @[phone] - , Copy sent to scan &Charged for.   Mobile number not working, [phone] home x2 someone answered but kept hanging up.  Original mailed to patient for his records.

## 2020-04-22 ENCOUNTER — Ambulatory Visit: Payer: BC Managed Care – PPO | Admitting: Internal Medicine

## 2020-04-29 ENCOUNTER — Ambulatory Visit
Admission: RE | Admit: 2020-04-29 | Discharge: 2020-04-29 | Disposition: A | Discharge status: Home / Self Care | Payer: BC Managed Care – PPO | Source: Ambulatory Visit | Attending: Orthopedic Surgery | Admitting: Orthopedic Surgery

## 2020-04-29 ENCOUNTER — Other Ambulatory Visit: Payer: Self-pay

## 2020-04-29 DIAGNOSIS — M25511 Pain in right shoulder: Secondary | ICD-10-CM

## 2020-05-03 DIAGNOSIS — M25511 Pain in right shoulder: Secondary | ICD-10-CM | POA: Diagnosis not present

## 2020-05-13 DIAGNOSIS — X58XXXA Exposure to other specified factors, initial encounter: Secondary | ICD-10-CM | POA: Diagnosis not present

## 2020-05-13 DIAGNOSIS — G8918 Other acute postprocedural pain: Secondary | ICD-10-CM | POA: Diagnosis not present

## 2020-05-13 DIAGNOSIS — M7521 Bicipital tendinitis, right shoulder: Secondary | ICD-10-CM | POA: Diagnosis not present

## 2020-05-13 DIAGNOSIS — M24111 Other articular cartilage disorders, right shoulder: Secondary | ICD-10-CM | POA: Diagnosis not present

## 2020-05-13 DIAGNOSIS — M19011 Primary osteoarthritis, right shoulder: Secondary | ICD-10-CM | POA: Diagnosis not present

## 2020-05-13 DIAGNOSIS — M7541 Impingement syndrome of right shoulder: Secondary | ICD-10-CM | POA: Diagnosis not present

## 2020-05-13 DIAGNOSIS — Y999 Unspecified external cause status: Secondary | ICD-10-CM | POA: Diagnosis not present

## 2020-05-13 DIAGNOSIS — S46011A Strain of muscle(s) and tendon(s) of the rotator cuff of right shoulder, initial encounter: Secondary | ICD-10-CM | POA: Diagnosis not present

## 2020-05-26 DIAGNOSIS — S46111D Strain of muscle, fascia and tendon of long head of biceps, right arm, subsequent encounter: Secondary | ICD-10-CM | POA: Diagnosis not present

## 2020-05-26 DIAGNOSIS — M6281 Muscle weakness (generalized): Secondary | ICD-10-CM | POA: Diagnosis not present

## 2020-05-26 DIAGNOSIS — S46011D Strain of muscle(s) and tendon(s) of the rotator cuff of right shoulder, subsequent encounter: Secondary | ICD-10-CM | POA: Diagnosis not present

## 2020-05-26 DIAGNOSIS — M25611 Stiffness of right shoulder, not elsewhere classified: Secondary | ICD-10-CM | POA: Diagnosis not present

## 2020-06-01 DIAGNOSIS — M25611 Stiffness of right shoulder, not elsewhere classified: Secondary | ICD-10-CM | POA: Diagnosis not present

## 2020-06-01 DIAGNOSIS — M6281 Muscle weakness (generalized): Secondary | ICD-10-CM | POA: Diagnosis not present

## 2020-06-01 DIAGNOSIS — S46111D Strain of muscle, fascia and tendon of long head of biceps, right arm, subsequent encounter: Secondary | ICD-10-CM | POA: Diagnosis not present

## 2020-06-01 DIAGNOSIS — S46011D Strain of muscle(s) and tendon(s) of the rotator cuff of right shoulder, subsequent encounter: Secondary | ICD-10-CM | POA: Diagnosis not present

## 2020-06-01 HISTORY — PX: SHOULDER ARTHROSCOPY W/ ROTATOR CUFF REPAIR: SHX2400

## 2020-06-03 DIAGNOSIS — S46011D Strain of muscle(s) and tendon(s) of the rotator cuff of right shoulder, subsequent encounter: Secondary | ICD-10-CM | POA: Diagnosis not present

## 2020-06-03 DIAGNOSIS — M25611 Stiffness of right shoulder, not elsewhere classified: Secondary | ICD-10-CM | POA: Diagnosis not present

## 2020-06-03 DIAGNOSIS — S46111D Strain of muscle, fascia and tendon of long head of biceps, right arm, subsequent encounter: Secondary | ICD-10-CM | POA: Diagnosis not present

## 2020-06-03 DIAGNOSIS — M6281 Muscle weakness (generalized): Secondary | ICD-10-CM | POA: Diagnosis not present

## 2020-06-08 DIAGNOSIS — M25611 Stiffness of right shoulder, not elsewhere classified: Secondary | ICD-10-CM | POA: Diagnosis not present

## 2020-06-08 DIAGNOSIS — S46111D Strain of muscle, fascia and tendon of long head of biceps, right arm, subsequent encounter: Secondary | ICD-10-CM | POA: Diagnosis not present

## 2020-06-08 DIAGNOSIS — M6281 Muscle weakness (generalized): Secondary | ICD-10-CM | POA: Diagnosis not present

## 2020-06-08 DIAGNOSIS — S46011D Strain of muscle(s) and tendon(s) of the rotator cuff of right shoulder, subsequent encounter: Secondary | ICD-10-CM | POA: Diagnosis not present

## 2020-06-22 DIAGNOSIS — M25611 Stiffness of right shoulder, not elsewhere classified: Secondary | ICD-10-CM | POA: Diagnosis not present

## 2020-06-22 DIAGNOSIS — S46011D Strain of muscle(s) and tendon(s) of the rotator cuff of right shoulder, subsequent encounter: Secondary | ICD-10-CM | POA: Diagnosis not present

## 2020-06-22 DIAGNOSIS — M6281 Muscle weakness (generalized): Secondary | ICD-10-CM | POA: Diagnosis not present

## 2020-06-22 DIAGNOSIS — S46111D Strain of muscle, fascia and tendon of long head of biceps, right arm, subsequent encounter: Secondary | ICD-10-CM | POA: Diagnosis not present

## 2020-07-01 DIAGNOSIS — M6281 Muscle weakness (generalized): Secondary | ICD-10-CM | POA: Diagnosis not present

## 2020-07-01 DIAGNOSIS — M25611 Stiffness of right shoulder, not elsewhere classified: Secondary | ICD-10-CM | POA: Diagnosis not present

## 2020-07-01 DIAGNOSIS — S46111D Strain of muscle, fascia and tendon of long head of biceps, right arm, subsequent encounter: Secondary | ICD-10-CM | POA: Diagnosis not present

## 2020-07-01 DIAGNOSIS — S46011D Strain of muscle(s) and tendon(s) of the rotator cuff of right shoulder, subsequent encounter: Secondary | ICD-10-CM | POA: Diagnosis not present

## 2020-07-07 DIAGNOSIS — M25611 Stiffness of right shoulder, not elsewhere classified: Secondary | ICD-10-CM | POA: Diagnosis not present

## 2020-07-07 DIAGNOSIS — M6281 Muscle weakness (generalized): Secondary | ICD-10-CM | POA: Diagnosis not present

## 2020-07-07 DIAGNOSIS — S46111D Strain of muscle, fascia and tendon of long head of biceps, right arm, subsequent encounter: Secondary | ICD-10-CM | POA: Diagnosis not present

## 2020-07-07 DIAGNOSIS — S46011D Strain of muscle(s) and tendon(s) of the rotator cuff of right shoulder, subsequent encounter: Secondary | ICD-10-CM | POA: Diagnosis not present

## 2020-07-12 DIAGNOSIS — M25611 Stiffness of right shoulder, not elsewhere classified: Secondary | ICD-10-CM | POA: Diagnosis not present

## 2020-07-12 DIAGNOSIS — S46011D Strain of muscle(s) and tendon(s) of the rotator cuff of right shoulder, subsequent encounter: Secondary | ICD-10-CM | POA: Diagnosis not present

## 2020-07-12 DIAGNOSIS — M6281 Muscle weakness (generalized): Secondary | ICD-10-CM | POA: Diagnosis not present

## 2020-07-12 DIAGNOSIS — S46111D Strain of muscle, fascia and tendon of long head of biceps, right arm, subsequent encounter: Secondary | ICD-10-CM | POA: Diagnosis not present

## 2020-09-12 ENCOUNTER — Other Ambulatory Visit: Payer: Self-pay | Admitting: Internal Medicine

## 2020-09-12 NOTE — Telephone Encounter (Signed)
Please refill as per office routine med refill policy (all routine meds refilled for 3 mo or monthly per pt preference up to one year from last visit, then month to month grace period for 3 mo, then further med refills will have to be denied)  

## 2020-10-01 DIAGNOSIS — R5383 Other fatigue: Secondary | ICD-10-CM | POA: Diagnosis not present

## 2020-10-01 DIAGNOSIS — E291 Testicular hypofunction: Secondary | ICD-10-CM | POA: Diagnosis not present

## 2020-10-01 DIAGNOSIS — R635 Abnormal weight gain: Secondary | ICD-10-CM | POA: Diagnosis not present

## 2020-10-01 DIAGNOSIS — E7800 Pure hypercholesterolemia, unspecified: Secondary | ICD-10-CM | POA: Diagnosis not present

## 2020-10-01 DIAGNOSIS — E559 Vitamin D deficiency, unspecified: Secondary | ICD-10-CM | POA: Diagnosis not present

## 2020-10-13 DIAGNOSIS — E291 Testicular hypofunction: Secondary | ICD-10-CM | POA: Diagnosis not present

## 2020-10-13 DIAGNOSIS — Z1331 Encounter for screening for depression: Secondary | ICD-10-CM | POA: Diagnosis not present

## 2020-10-13 DIAGNOSIS — R6882 Decreased libido: Secondary | ICD-10-CM | POA: Diagnosis not present

## 2020-10-13 DIAGNOSIS — R5383 Other fatigue: Secondary | ICD-10-CM | POA: Diagnosis not present

## 2020-10-13 DIAGNOSIS — M2550 Pain in unspecified joint: Secondary | ICD-10-CM | POA: Diagnosis not present

## 2020-10-13 DIAGNOSIS — Z1339 Encounter for screening examination for other mental health and behavioral disorders: Secondary | ICD-10-CM | POA: Diagnosis not present

## 2020-10-20 DIAGNOSIS — Z6841 Body Mass Index (BMI) 40.0 and over, adult: Secondary | ICD-10-CM | POA: Diagnosis not present

## 2020-10-20 DIAGNOSIS — E291 Testicular hypofunction: Secondary | ICD-10-CM | POA: Diagnosis not present

## 2020-10-20 DIAGNOSIS — E7800 Pure hypercholesterolemia, unspecified: Secondary | ICD-10-CM | POA: Diagnosis not present

## 2020-10-27 DIAGNOSIS — E291 Testicular hypofunction: Secondary | ICD-10-CM | POA: Diagnosis not present

## 2020-10-27 DIAGNOSIS — Z6841 Body Mass Index (BMI) 40.0 and over, adult: Secondary | ICD-10-CM | POA: Diagnosis not present

## 2020-10-27 DIAGNOSIS — E559 Vitamin D deficiency, unspecified: Secondary | ICD-10-CM | POA: Diagnosis not present

## 2020-10-28 ENCOUNTER — Telehealth (INDEPENDENT_AMBULATORY_CARE_PROVIDER_SITE_OTHER): Payer: BC Managed Care – PPO | Admitting: Family Medicine

## 2020-10-28 DIAGNOSIS — R21 Rash and other nonspecific skin eruption: Secondary | ICD-10-CM

## 2020-10-28 NOTE — Patient Instructions (Signed)
Hypoallergenic detergent and soap.  Clotrimazole(lotrimin) cream twice daily - this is available over the counter.  Try to avoid hot or restrictive clothing in this area  Schedule follow up visit in person in next 1-2 weeks  I hope you are feeling better soon!  Seek in person care promptly if your symptoms worsen, new concerns arise or you are not improving with treatment.  It was nice to meet you today. I help [facility] out with telemedicine visits on Tuesdays and Thursdays and am available for visits on those days. If you have any concerns or questions following this visit please schedule a follow up visit with your Primary Care doctor or seek care at a local urgent care clinic to avoid delays in care.

## 2020-10-28 NOTE — Progress Notes (Signed)
Virtual Visit via Video Note  I connected with Carlos Tapia  on 10/28/20 at  6:00 PM EDT by a video enabled telemedicine application and verified that I am speaking with the correct person using two identifiers.  Location patient: home, [place] Location provider:work or home office Persons participating in the virtual visit: patient, provider  I discussed the limitations of evaluation and management by telemedicine and the availability of in person appointments. The patient expressed understanding and agreed to proceed.   HPI:  Acute telemedicine visit for a rash: -Onset: onset a few months ago, now worsening the last few weeks -reports worsens in heat and when sweats a lot and with collared shirt - anterior neck in area of crease here -red irritated (itchy,burning) skin, not raised, no papules or pustules -denies any rash anywhere else -Denies:fevers, malaise, SOB, CP -tide free  detergent- no change; uses [place] -Has tried:hydrocortisone -Pertinent medication allergies: No Known Allergies   ROS: See pertinent positives and negatives per HPI.  Past Medical History:  Diagnosis Date   Blood in stool    Chicken pox    GERD (gastroesophageal reflux disease)    Migraines    Shingles 2009, 2010    No past surgical history on file.   Current Outpatient Medications:    albuterol (VENTOLIN HFA) 108 (90 Base) MCG/ACT inhaler, Inhale 2 puffs into the lungs every 6 (six) hours as needed for wheezing or shortness of breath., Disp: 18 g, Rfl: 1   Cholecalciferol (VITAMIN D3) 50 MCG (2000 UT) capsule, Take 1 capsule (2,000 Units total) by mouth daily., Disp: 100 capsule, Rfl: 3   cyclobenzaprine (FLEXERIL) 5 MG tablet, Take 1 tablet (5 mg total) by mouth 3 (three) times daily as needed for muscle spasms., Disp: 30 tablet, Rfl: 1   esomeprazole (NEXIUM) 40 MG capsule, Take 1 capsule (40 mg total) by mouth daily., Disp: 30 capsule, Rfl: 5   ibuprofen (ADVIL) 800 MG tablet, Take 1 tablet (800  mg total) by mouth every 8 (eight) hours as needed., Disp: 60 tablet, Rfl: 0   methylPREDNISolone (MEDROL DOSEPAK) 4 MG TBPK tablet, As directed, Disp: 21 tablet, Rfl: 0   pseudoephedrine (SUDAFED) 120 MG 12 hr tablet, Take 1 tablet (120 mg total) by mouth every 12 (twelve) hours as needed for congestion., Disp: 30 tablet, Rfl: 1   SYMBICORT 160-4.5 MCG/ACT inhaler, Inhale 2 puffs by mouth twice daily, Disp: 11 g, Rfl: 0   valsartan (DIOVAN) 80 MG tablet, Take 1 tablet (80 mg total) by mouth daily., Disp: 30 tablet, Rfl: 11  EXAM:  VITALS per patient if applicable:  GENERAL: alert, oriented, appears well and in no acute distress  HEENT: atraumatic, conjunttiva clear, no obvious abnormalities on inspection of external nose and ears  NECK: normal movements of the head and neck  LUNGS: on inspection no signs of respiratory distress, breathing rate appears normal, no obvious gross SOB, gasping or wheezing  CV: no obvious cyanosis  SKIN: clearly demarcated  erythema of skin in area of ant neck pannus, poor video quality but do not appreciate papules or pustules  MS: moves all visible extremities without noticeable abnormality  PSYCH/NEURO: pleasant and cooperative, no obvious depression or anxiety, speech and thought processing grossly intact  ASSESSMENT AND PLAN:  Discussed the following assessment and plan:  Skin rash  -we discussed possible serious and likely etiologies, options for evaluation and workup, limitations of telemedicine visit vs in person visit, treatment, treatment risks and precautions. Pt prefers to treat via  telemedicine empirically rather than in person at this moment. Query yeast/fungal given descriptions of rash and location in skin fold. Opted for hypoallergenic skin regimen, trial of topical clotrimazole, staying as cool as possible and follow up with PCP office in person next week for good exam.  Scheduled follow up with PCP offered: patient prefers to call to  schedule follow up and agrees to do so Advised to seek prompt in person care sooner if worsening, new symptoms arise, or if is not improving with treatment.    I discussed the assessment and treatment plan with the patient. The patient was provided an opportunity to ask questions and all were answered. The patient agreed with the plan and demonstrated an understanding of the instructions.     Carlos Koyanagi, DO

## 2020-11-02 DIAGNOSIS — Z6841 Body Mass Index (BMI) 40.0 and over, adult: Secondary | ICD-10-CM | POA: Diagnosis not present

## 2020-11-02 DIAGNOSIS — E291 Testicular hypofunction: Secondary | ICD-10-CM | POA: Diagnosis not present

## 2020-11-02 DIAGNOSIS — E7800 Pure hypercholesterolemia, unspecified: Secondary | ICD-10-CM | POA: Diagnosis not present

## 2020-11-09 DIAGNOSIS — E559 Vitamin D deficiency, unspecified: Secondary | ICD-10-CM | POA: Diagnosis not present

## 2020-11-09 DIAGNOSIS — Z6841 Body Mass Index (BMI) 40.0 and over, adult: Secondary | ICD-10-CM | POA: Diagnosis not present

## 2020-11-09 DIAGNOSIS — E291 Testicular hypofunction: Secondary | ICD-10-CM | POA: Diagnosis not present

## 2020-11-17 DIAGNOSIS — R5383 Other fatigue: Secondary | ICD-10-CM | POA: Diagnosis not present

## 2020-11-17 DIAGNOSIS — E291 Testicular hypofunction: Secondary | ICD-10-CM | POA: Diagnosis not present

## 2020-11-17 DIAGNOSIS — Z79890 Hormone replacement therapy (postmenopausal): Secondary | ICD-10-CM | POA: Diagnosis not present

## 2020-11-17 DIAGNOSIS — Z6841 Body Mass Index (BMI) 40.0 and over, adult: Secondary | ICD-10-CM | POA: Diagnosis not present

## 2020-11-17 DIAGNOSIS — E7800 Pure hypercholesterolemia, unspecified: Secondary | ICD-10-CM | POA: Diagnosis not present

## 2020-11-24 DIAGNOSIS — Z6841 Body Mass Index (BMI) 40.0 and over, adult: Secondary | ICD-10-CM | POA: Diagnosis not present

## 2020-11-24 DIAGNOSIS — R6882 Decreased libido: Secondary | ICD-10-CM | POA: Diagnosis not present

## 2020-11-24 DIAGNOSIS — R5383 Other fatigue: Secondary | ICD-10-CM | POA: Diagnosis not present

## 2020-11-24 DIAGNOSIS — E291 Testicular hypofunction: Secondary | ICD-10-CM | POA: Diagnosis not present

## 2020-12-01 DIAGNOSIS — E7800 Pure hypercholesterolemia, unspecified: Secondary | ICD-10-CM | POA: Diagnosis not present

## 2020-12-01 DIAGNOSIS — E291 Testicular hypofunction: Secondary | ICD-10-CM | POA: Diagnosis not present

## 2020-12-01 DIAGNOSIS — Z6841 Body Mass Index (BMI) 40.0 and over, adult: Secondary | ICD-10-CM | POA: Diagnosis not present

## 2020-12-08 DIAGNOSIS — E291 Testicular hypofunction: Secondary | ICD-10-CM | POA: Diagnosis not present

## 2020-12-08 DIAGNOSIS — Z6841 Body Mass Index (BMI) 40.0 and over, adult: Secondary | ICD-10-CM | POA: Diagnosis not present

## 2020-12-08 DIAGNOSIS — M2550 Pain in unspecified joint: Secondary | ICD-10-CM | POA: Diagnosis not present

## 2020-12-15 DIAGNOSIS — E7800 Pure hypercholesterolemia, unspecified: Secondary | ICD-10-CM | POA: Diagnosis not present

## 2020-12-15 DIAGNOSIS — Z6841 Body Mass Index (BMI) 40.0 and over, adult: Secondary | ICD-10-CM | POA: Diagnosis not present

## 2020-12-15 DIAGNOSIS — E291 Testicular hypofunction: Secondary | ICD-10-CM | POA: Diagnosis not present

## 2020-12-17 ENCOUNTER — Other Ambulatory Visit: Payer: Self-pay

## 2020-12-17 ENCOUNTER — Telehealth: Payer: BC Managed Care – PPO | Admitting: Internal Medicine

## 2020-12-17 NOTE — Progress Notes (Signed)
Virtual Visit via Video Note  I connected with Carlos Tapia on 12/17/20 at  3:40 PM EDT by a video enabled telemedicine application however he forgot about the visit and was unable to join.   Myrlene Broker, MD

## 2020-12-22 DIAGNOSIS — Z6841 Body Mass Index (BMI) 40.0 and over, adult: Secondary | ICD-10-CM | POA: Diagnosis not present

## 2020-12-22 DIAGNOSIS — E291 Testicular hypofunction: Secondary | ICD-10-CM | POA: Diagnosis not present

## 2020-12-29 DIAGNOSIS — Z6841 Body Mass Index (BMI) 40.0 and over, adult: Secondary | ICD-10-CM | POA: Diagnosis not present

## 2020-12-29 DIAGNOSIS — E7800 Pure hypercholesterolemia, unspecified: Secondary | ICD-10-CM | POA: Diagnosis not present

## 2020-12-29 DIAGNOSIS — E291 Testicular hypofunction: Secondary | ICD-10-CM | POA: Diagnosis not present

## 2021-04-22 ENCOUNTER — Ambulatory Visit: Payer: BC Managed Care – PPO | Admitting: Nurse Practitioner

## 2021-10-03 ENCOUNTER — Ambulatory Visit (INDEPENDENT_AMBULATORY_CARE_PROVIDER_SITE_OTHER): Payer: BC Managed Care – PPO | Admitting: Internal Medicine

## 2021-10-03 ENCOUNTER — Encounter: Payer: Self-pay | Admitting: Internal Medicine

## 2021-10-03 VITALS — BP 120/68 | HR 61 | Temp 98.3°F | Ht 70.0 in | Wt 210.0 lb

## 2021-10-03 DIAGNOSIS — F32A Depression, unspecified: Secondary | ICD-10-CM

## 2021-10-03 DIAGNOSIS — Z1159 Encounter for screening for other viral diseases: Secondary | ICD-10-CM

## 2021-10-03 DIAGNOSIS — E785 Hyperlipidemia, unspecified: Secondary | ICD-10-CM | POA: Insufficient documentation

## 2021-10-03 DIAGNOSIS — E559 Vitamin D deficiency, unspecified: Secondary | ICD-10-CM

## 2021-10-03 DIAGNOSIS — E7800 Pure hypercholesterolemia, unspecified: Secondary | ICD-10-CM | POA: Diagnosis not present

## 2021-10-03 DIAGNOSIS — R739 Hyperglycemia, unspecified: Secondary | ICD-10-CM | POA: Diagnosis not present

## 2021-10-03 DIAGNOSIS — F422 Mixed obsessional thoughts and acts: Secondary | ICD-10-CM | POA: Insufficient documentation

## 2021-10-03 DIAGNOSIS — Z0001 Encounter for general adult medical examination with abnormal findings: Secondary | ICD-10-CM | POA: Diagnosis not present

## 2021-10-03 DIAGNOSIS — Z114 Encounter for screening for human immunodeficiency virus [HIV]: Secondary | ICD-10-CM | POA: Diagnosis not present

## 2021-10-03 DIAGNOSIS — E538 Deficiency of other specified B group vitamins: Secondary | ICD-10-CM

## 2021-10-03 DIAGNOSIS — F419 Anxiety disorder, unspecified: Secondary | ICD-10-CM | POA: Diagnosis not present

## 2021-10-03 DIAGNOSIS — R7989 Other specified abnormal findings of blood chemistry: Secondary | ICD-10-CM

## 2021-10-03 DIAGNOSIS — G4730 Sleep apnea, unspecified: Secondary | ICD-10-CM

## 2021-10-03 DIAGNOSIS — Z125 Encounter for screening for malignant neoplasm of prostate: Secondary | ICD-10-CM

## 2021-10-03 LAB — LIPID PANEL
Cholesterol: 142 mg/dL (ref 0–200)
HDL: 39.5 mg/dL (ref >39.00)
LDL Cholesterol: 88 mg/dL (ref 0–99)
NonHDL: 102.24
Total CHOL/HDL Ratio: 4
Triglycerides: 71 mg/dL (ref 0.0–149.0)
VLDL: 14.2 mg/dL (ref 0.0–40.0)

## 2021-10-03 LAB — CBC WITH DIFFERENTIAL/PLATELET
Basophils Absolute: 0.1 10*3/uL (ref 0.0–0.1)
Basophils Relative: 0.5 % (ref 0.0–3.0)
Eosinophils Absolute: 0.3 10*3/uL (ref 0.0–0.7)
Eosinophils Relative: 3.3 % (ref 0.0–5.0)
HCT: 44.7 % (ref 39.0–52.0)
Hemoglobin: 14.8 g/dL (ref 13.0–17.0)
Lymphocytes Relative: 24.6 % (ref 12.0–46.0)
Lymphs Abs: 2.6 10*3/uL (ref 0.7–4.0)
MCHC: 33 g/dL (ref 30.0–36.0)
MCV: 94.5 fl (ref 78.0–100.0)
Monocytes Absolute: 0.7 10*3/uL (ref 0.1–1.0)
Monocytes Relative: 7.1 % (ref 3.0–12.0)
Neutro Abs: 6.7 10*3/uL (ref 1.4–7.7)
Neutrophils Relative %: 64.5 % (ref 43.0–77.0)
Platelets: 234 10*3/uL (ref 150.0–400.0)
RBC: 4.73 Mil/uL (ref 4.22–5.81)
RDW: 13.8 % (ref 11.5–15.5)
WBC: 10.4 10*3/uL (ref 4.0–10.5)

## 2021-10-03 LAB — HEPATIC FUNCTION PANEL
ALT: 23 U/L (ref 0–53)
AST: 19 U/L (ref 0–37)
Albumin: 4.6 g/dL (ref 3.5–5.2)
Alkaline Phosphatase: 84 U/L (ref 39–117)
Bilirubin, Direct: 0.1 mg/dL (ref 0.0–0.3)
Total Bilirubin: 0.4 mg/dL (ref 0.2–1.2)
Total Protein: 6.8 g/dL (ref 6.0–8.3)

## 2021-10-03 LAB — URINALYSIS, ROUTINE W REFLEX MICROSCOPIC
Bilirubin Urine: NEGATIVE
Ketones, ur: NEGATIVE
Leukocytes,Ua: NEGATIVE
Nitrite: NEGATIVE
Specific Gravity, Urine: 1.015 (ref 1.000–1.030)
Total Protein, Urine: NEGATIVE
Urine Glucose: NEGATIVE
Urobilinogen, UA: 0.2 (ref 0.0–1.0)
WBC, UA: NONE SEEN (ref 0–?)
pH: 6.5 (ref 5.0–8.0)

## 2021-10-03 LAB — BASIC METABOLIC PANEL
BUN: 33 mg/dL — ABNORMAL HIGH (ref 6–23)
CO2: 28 mEq/L (ref 19–32)
Calcium: 9.4 mg/dL (ref 8.4–10.5)
Chloride: 102 mEq/L (ref 96–112)
Creatinine, Ser: 1.03 mg/dL (ref 0.40–1.50)
GFR: 85.56 mL/min (ref >60.00)
Glucose, Bld: 81 mg/dL (ref 70–99)
Potassium: 4.4 mEq/L (ref 3.5–5.1)
Sodium: 139 mEq/L (ref 135–145)

## 2021-10-03 LAB — TSH: TSH: 1.64 u[IU]/mL (ref 0.35–5.50)

## 2021-10-03 LAB — PSA: PSA: 0.22 ng/mL (ref 0.10–4.00)

## 2021-10-03 LAB — HEMOGLOBIN A1C: Hgb A1c MFr Bld: 5.4 % (ref 4.6–6.5)

## 2021-10-03 LAB — VITAMIN B12: Vitamin B-12: 695 pg/mL (ref 211–911)

## 2021-10-03 LAB — TESTOSTERONE: Testosterone: 306.26 ng/dL (ref 300.00–890.00)

## 2021-10-03 LAB — VITAMIN D 25 HYDROXY (VIT D DEFICIENCY, FRACTURES): VITD: 39.13 ng/mL (ref 30.00–100.00)

## 2021-10-03 NOTE — Patient Instructions (Signed)
Please continue all other medications as before, and refills have been done if requested.  Please have the pharmacy call with any other refills you may need.  Please continue your efforts at being more active, low cholesterol diet, and weight control.  You are otherwise up to date with prevention measures today.  Please keep your appointments with your specialists as you may have planned  You will be contacted regarding the referral for: psychiatry  Please go to the LAB at the blood drawing area for the tests to be done  You will be contacted by phone if any changes need to be made immediately.  Otherwise, you will receive a letter about your results with an explanation, but please check with MyChart first.  Please remember to sign up for MyChart if you have not done so, as this will be important to you in the future with finding out test results, communicating by private email, and scheduling acute appointments online when needed.  Please make an Appointment to return for your 1 year visit, or sooner if needed

## 2021-10-03 NOTE — Progress Notes (Unsigned)
Patient ID: Carlos Tapia, male   DOB: 06/08/1970, 51 y.o.   MRN: [id number]         Chief Complaint:: wellness exam and hypogonadism, depression/anxiety, hyeprglycemia, hld       HPI:  Carlos Tapia is a 51 y.o. male here for wellness exam; due for hep c screen, o/w up to date                        Also with syncope episode approx 3 mo ago with dieting and low po intake.  Wt down from 327 (peak of 355) now to 210 intentionally and rather aggressively, thinks he now overdid it.    Pt denies chest pain, increased sob or doe, wheezing, orthopnea, PND, increased LE swelling, palpitations, dizziness or syncope.   Pt denies polydipsia, polyuria, or new focal neuro s/s.    Pt denies fever, wt loss, night sweats, loss of appetite, or other constitutional symptoms  Does c/o ongoing fatigue, but denies signficant daytime hypersomnolence, asks for f/u testosterone level.  Has had worsening worsening depressive symptoms, but no suicidal ideation, or panic; has ongoing anxiety, with increase recently uncontrolled, pt is ok for psychiatry referral.      Wt Readings from Last 3 Encounters:  10/03/21 210 lb (95.3 kg)  04/08/20 (!) 327 lb 9.6 oz (148.6 kg)  06/10/19 (!) 319 lb (144.7 kg)   BP Readings from Last 3 Encounters:  10/03/21 120/68  04/08/20 (!) 144/86  06/10/19 (!) 138/94   Immunization History  Administered Date(s) Administered   PFIZER(Purple Top)SARS-COV-2 Vaccination 06/12/2019, 11/05/2019   Tdap 06/28/2015   Health Maintenance Due  Topic Date Due   Hepatitis C Screening  Never done      Past Medical History:  Diagnosis Date   Blood in stool    Chicken pox    GERD (gastroesophageal reflux disease)    Migraines    Shingles 2009, 2010   History reviewed. No pertinent surgical history.  reports that he quit smoking about 5 years ago. His smoking use included cigarettes. He has a 22.50 pack-year smoking history. He has never used smokeless tobacco. He reports current alcohol use.  He reports current drug use. Frequency: 7.00 times per week. Drug: Marijuana. family history includes Heart disease in his mother. He was adopted. No Known Allergies Current Outpatient Medications on File Prior to Visit  Medication Sig Dispense Refill   anastrozole (ARIMIDEX) 1 MG tablet Take by mouth.     albuterol (VENTOLIN HFA) 108 (90 Base) MCG/ACT inhaler Inhale 2 puffs into the lungs every 6 (six) hours as needed for wheezing or shortness of breath. (Patient not taking: Reported on 10/03/2021) 18 g 1   Cholecalciferol (VITAMIN D3) 50 MCG (2000 UT) capsule Take 1 capsule (2,000 Units total) by mouth daily. (Patient not taking: Reported on 10/03/2021) 100 capsule 3   cyclobenzaprine (FLEXERIL) 5 MG tablet Take 1 tablet (5 mg total) by mouth 3 (three) times daily as needed for muscle spasms. (Patient not taking: Reported on 10/03/2021) 30 tablet 1   esomeprazole (NEXIUM) 40 MG capsule Take 1 capsule (40 mg total) by mouth daily. 30 capsule 5   ibuprofen (ADVIL) 800 MG tablet Take 1 tablet (800 mg total) by mouth every 8 (eight) hours as needed. (Patient not taking: Reported on 10/03/2021) 60 tablet 0   SYMBICORT 160-4.5 MCG/ACT inhaler Inhale 2 puffs by mouth twice daily (Patient not taking: Reported on 10/03/2021) 11 g 0   valsartan (  DIOVAN) 80 MG tablet Take 1 tablet (80 mg total) by mouth daily. (Patient not taking: Reported on 10/03/2021) 30 tablet 11   No current facility-administered medications on file prior to visit.        ROS:  All others reviewed and negative.  Objective        PE:  BP 120/68 (BP Location: Right Arm, Patient Position: Sitting, Cuff Size: Large)   Pulse 61   Temp 98.3 F (36.8 C) (Oral)   Ht 5\' 10"  (1.778 m)   Wt 210 lb (95.3 kg)   SpO2 97%   BMI 30.13 kg/m                 Constitutional: Pt appears in NAD               HENT: Head: NCAT.                Right Ear: External ear normal.                 Left Ear: External ear normal.                Eyes: . Pupils are  equal, round, and reactive to light. Conjunctivae and EOM are normal               Nose: without d/c or deformity               Neck: Neck supple. Gross normal ROM               Cardiovascular: Normal rate and regular rhythm.                 Pulmonary/Chest: Effort normal and breath sounds without rales or wheezing.                Abd:  Soft, NT, ND, + BS, no organomegaly               Neurological: Pt is alert. At baseline orientation, motor grossly intact               Skin: Skin is warm. No rashes, no other new lesions, LE edema - none               Psychiatric: Pt behavior is normal without agitation , depressed nervous affect  Micro: none  Cardiac tracings I have personally interpreted today:  none  Pertinent Radiological findings (summarize): none   Lab Results  Component Value Date   WBC 10.4 10/03/2021   HGB 14.8 10/03/2021   HCT 44.7 10/03/2021   PLT 234.0 10/03/2021   GLUCOSE 81 10/03/2021   CHOL 142 10/03/2021   TRIG 71.0 10/03/2021   HDL 39.50 10/03/2021   LDLCALC 88 10/03/2021   ALT 23 10/03/2021   AST 19 10/03/2021   NA 139 10/03/2021   K 4.4 10/03/2021   CL 102 10/03/2021   CREATININE 1.03 10/03/2021   BUN 33 (H) 10/03/2021   CO2 28 10/03/2021   TSH 1.64 10/03/2021   PSA 0.22 10/03/2021   HGBA1C 5.4 10/03/2021   Assessment/Plan:  Carlos Tapia is a 51 y.o. White or Caucasian [1] male with  has a past medical history of Blood in stool, Chicken pox, GERD (gastroesophageal reflux disease), Migraines, and Shingles (2009, 2010).  Encounter for well adult exam with abnormal findings Age and sex appropriate education and counseling updated with regular exercise and diet Referrals for preventative services - for hep c screen Immunizations addressed -  none needed Smoking counseling  - none needed Evidence for depression or other mood disorder - worsening depresion anxiety - assks for psychiatry referral Most recent labs reviewed. I have personally reviewed and  have noted: 1) the patient's medical and social history 2) The patient's current medications and supplements 3) The patient's height, weight, and BMI have been recorded in the chart   Sleep apnea in adult Symptomatically improved with wt loss, declines f/u with pulmonary for now  Low testosterone in male With recent fatigue, pt asks for testosterone level with labs  Hyperglycemia Lab Results  Component Value Date   HGBA1C 5.4 10/03/2021   Stable, pt to continue current medical treatment  - diet, wt control, activity   HLD (hyperlipidemia) Lab Results  Component Value Date   LDLCALC 88 10/03/2021   Stable, pt to continue current low chol diet, declines statin for now   Anxiety and depression Worsening recently, no SI or HI, declines med for now, but for psychiatry referral  Followup: Return in about 1 year (around 10/04/2022).  Oliver Barre, MD 10/04/2021 11:11 AM [facility]

## 2021-10-04 ENCOUNTER — Encounter: Payer: Self-pay | Admitting: Internal Medicine

## 2021-10-04 LAB — HEPATITIS C ANTIBODY: Hepatitis C Ab: NONREACTIVE

## 2021-10-04 LAB — HIV ANTIBODY (ROUTINE TESTING W REFLEX): HIV 1&2 Ab, 4th Generation: NONREACTIVE

## 2021-10-04 NOTE — Assessment & Plan Note (Signed)
With recent fatigue, pt asks for testosterone level with labs

## 2021-10-04 NOTE — Assessment & Plan Note (Signed)
Lab Results  Component Value Date   HGBA1C 5.4 10/03/2021   Stable, pt to continue current medical treatment  - diet, wt control, activity

## 2021-10-04 NOTE — Assessment & Plan Note (Signed)
Lab Results  Component Value Date   LDLCALC 88 10/03/2021   Stable, pt to continue current low chol diet, declines statin for now

## 2021-10-04 NOTE — Assessment & Plan Note (Signed)
Symptomatically improved with wt loss, declines f/u with pulmonary for now

## 2021-10-04 NOTE — Assessment & Plan Note (Signed)
Age and sex appropriate education and counseling updated with regular exercise and diet Referrals for preventative services - for hep c screen Immunizations addressed - none needed Smoking counseling  - none needed Evidence for depression or other mood disorder - worsening depresion anxiety - assks for psychiatry referral Most recent labs reviewed. I have personally reviewed and have noted: 1) the patient's medical and social history 2) The patient's current medications and supplements 3) The patient's height, weight, and BMI have been recorded in the chart

## 2021-10-04 NOTE — Assessment & Plan Note (Signed)
Worsening recently, no SI or HI, declines med for now, but for psychiatry referral

## 2021-12-15 ENCOUNTER — Ambulatory Visit (HOSPITAL_COMMUNITY): Payer: Self-pay | Admitting: Psychiatry

## 2022-01-10 ENCOUNTER — Ambulatory Visit (HOSPITAL_BASED_OUTPATIENT_CLINIC_OR_DEPARTMENT_OTHER): Payer: BC Managed Care – PPO | Admitting: Psychiatry

## 2022-01-10 ENCOUNTER — Encounter (HOSPITAL_COMMUNITY): Payer: Self-pay | Admitting: Psychiatry

## 2022-01-10 VITALS — Wt 210.0 lb

## 2022-01-10 DIAGNOSIS — F319 Bipolar disorder, unspecified: Secondary | ICD-10-CM | POA: Diagnosis not present

## 2022-01-10 DIAGNOSIS — F1210 Cannabis abuse, uncomplicated: Secondary | ICD-10-CM

## 2022-01-10 DIAGNOSIS — T1490XA Injury, unspecified, initial encounter: Secondary | ICD-10-CM

## 2022-01-10 MED ORDER — LAMOTRIGINE 25 MG PO TABS: ORAL_TABLET | ORAL | 1 refills | Status: DC
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
## 2022-01-12 ENCOUNTER — Ambulatory Visit: Payer: BC Managed Care – PPO | Admitting: Internal Medicine

## 2022-01-12 ENCOUNTER — Encounter: Payer: Self-pay | Admitting: Internal Medicine

## 2022-01-12 VITALS — BP 128/74 | HR 65 | Temp 98.5°F | Ht 70.0 in | Wt 220.0 lb

## 2022-01-12 DIAGNOSIS — G8929 Other chronic pain: Secondary | ICD-10-CM

## 2022-01-12 DIAGNOSIS — R739 Hyperglycemia, unspecified: Secondary | ICD-10-CM

## 2022-01-12 DIAGNOSIS — F32A Depression, unspecified: Secondary | ICD-10-CM

## 2022-01-12 DIAGNOSIS — M25511 Pain in right shoulder: Secondary | ICD-10-CM | POA: Diagnosis not present

## 2022-01-12 DIAGNOSIS — F419 Anxiety disorder, unspecified: Secondary | ICD-10-CM

## 2022-01-12 MED ORDER — TRAMADOL HCL 50 MG PO TABS: 50.0000 mg | ORAL_TABLET | Freq: Four times a day (QID) | ORAL | 0 refills | Status: DC | PRN

## 2022-01-12 MED ORDER — PREDNISONE 10 MG PO TABS: ORAL_TABLET | ORAL | 0 refills | Status: DC

## 2022-01-12 MED ORDER — CYCLOBENZAPRINE HCL 5 MG PO TABS: 5.0000 mg | ORAL_TABLET | Freq: Three times a day (TID) | ORAL | 1 refills | Status: DC | PRN

## 2022-01-12 NOTE — Assessment & Plan Note (Signed)
Stable overall on lamictal as per recent psychiatry it seems,  to f/u any worsening symptoms or concerns

## 2022-01-12 NOTE — Assessment & Plan Note (Signed)
Lab Results  Component Value Date   HGBA1C 5.4 10/03/2021   Stable, pt to continue current medical treatment  - diet, wt control, excercise

## 2022-01-12 NOTE — Progress Notes (Signed)
Patient ID: Carlos Tapia, male   DOB: 03/06/1971, 51 y.o.   MRN: [id number]        Chief Complaint: follow up right shoulder pain       HPI:  Carlos Tapia is a 51 y.o. male here with c/o recurrent severe right shoulder pain lasting days, sharp, without radiation but associated with sharp pain tender at the right lateral neck as well, for 6 wks.  Only acts up after lifting weights sessions, severe then better over next 2-3 days, but keeps happening each time.  Has FROM of the shoulder, but has had prior right rotater cuff surgury with what sounds like right bicipital tendon reattachment.  Pt denies chest pain, increased sob or doe, wheezing, orthopnea, PND, increased LE swelling, palpitations, dizziness or syncope.   Pt denies polydipsia, polyuria, or new focal neuro s/s.  Denies worsening depressive symptoms, suicidal ideation, or panic       Wt Readings from Last 3 Encounters:  01/12/22 220 lb (99.8 kg)  10/03/21 210 lb (95.3 kg)  04/08/20 (!) 327 lb 9.6 oz (148.6 kg)   BP Readings from Last 3 Encounters:  01/12/22 128/74  10/03/21 120/68  04/08/20 (!) 144/86         Past Medical History:  Diagnosis Date   Blood in stool    Chicken pox    GERD (gastroesophageal reflux disease)    Migraines    Shingles 2009, 2010   History reviewed. No pertinent surgical history.  reports that he quit smoking about 5 years ago. His smoking use included cigarettes. He has a 22.50 pack-year smoking history. He has never used smokeless tobacco. He reports current alcohol use. He reports current drug use. Frequency: 7.00 times per week. Drug: Marijuana. family history includes Heart disease in his mother. He was adopted. No Known Allergies Current Outpatient Medications on File Prior to Visit  Medication Sig Dispense Refill   lamoTRIgine (LAMICTAL) 25 MG tablet Take one tab daily for one week and than two pills a day 60 tablet 1   No current facility-administered medications on file prior to visit.         ROS:  All others reviewed and negative.  Objective        PE:  BP 128/74 (BP Location: Left Arm, Patient Position: Sitting, Cuff Size: Large)   Pulse 65   Temp 98.5 F (36.9 C) (Oral)   Ht 5\' 10"  (1.778 m)   Wt 220 lb (99.8 kg)   SpO2 95%   BMI 31.57 kg/m                 Constitutional: Pt appears in NAD               HENT: Head: NCAT.                Right Ear: External ear normal.                 Left Ear: External ear normal.                Eyes: . Pupils are equal, round, and reactive to light. Conjunctivae and EOM are normal               Nose: without d/c or deformity               Neck: Neck supple. Gross normal ROM               Cardiovascular: Normal rate  and regular rhythm.                 Pulmonary/Chest: Effort normal and breath sounds without rales or wheezing.                Right shoulder near FROM abduction and forward elevation, has tender over right bicipital tendon insertion area, also mild tender right SCM               Neurological: Pt is alert. At baseline orientation, motor grossly intact               Skin: Skin is warm. No rashes, no other new lesions, LE edema - none               Psychiatric: Pt behavior is normal without agitation   Micro: none  Cardiac tracings I have personally interpreted today:  none  Pertinent Radiological findings (summarize): none   Lab Results  Component Value Date   WBC 10.4 10/03/2021   HGB 14.8 10/03/2021   HCT 44.7 10/03/2021   PLT 234.0 10/03/2021   GLUCOSE 81 10/03/2021   CHOL 142 10/03/2021   TRIG 71.0 10/03/2021   HDL 39.50 10/03/2021   LDLCALC 88 10/03/2021   ALT 23 10/03/2021   AST 19 10/03/2021   NA 139 10/03/2021   K 4.4 10/03/2021   CL 102 10/03/2021   CREATININE 1.03 10/03/2021   BUN 33 (H) 10/03/2021   CO2 28 10/03/2021   TSH 1.64 10/03/2021   PSA 0.22 10/03/2021   HGBA1C 5.4 10/03/2021   Assessment/Plan:  Carlos Tapia is a 51 y.o. White or Caucasian [1] male with  has a past  medical history of Blood in stool, Chicken pox, GERD (gastroesophageal reflux disease), Migraines, and Shingles (2009, 2010).  Chronic right shoulder pain With recurring acute flares over 6 wks only with lifting weights for exercise; exam c/w probable bicipital tendonitis and right scm strain, pt advised to hold on lifiting weights for now, also for pain control, muscle relaxer prn, and prednisone, also f/u sports medicine  Hyperglycemia Lab Results  Component Value Date   HGBA1C 5.4 10/03/2021   Stable, pt to continue current medical treatment  - diet, wt control, excercise   Anxiety and depression Stable overall on lamictal as per recent psychiatry it seems,  to f/u any worsening symptoms or concerns  Followup: Return if symptoms worsen or fail to improve.  Oliver Barre, MD 01/12/2022 7:57 PM [facility]

## 2022-01-12 NOTE — Patient Instructions (Signed)
Please take all new medication as prescribed - the pain medication (tramadol), muscle relaxer, and prednisone  You will be contacted regarding the referral for: Sports Medicine on the first floor  Please continue all other medications as before, and refills have been done if requested.  Please have the pharmacy call with any other refills you may need.  Please keep your appointments with your specialists as you may have planned

## 2022-01-12 NOTE — Assessment & Plan Note (Signed)
With recurring acute flares over 6 wks only with lifting weights for exercise; exam c/w probable bicipital tendonitis and right scm strain, pt advised to hold on lifiting weights for now, also for pain control, muscle relaxer prn, and prednisone, also f/u sports medicine

## 2022-01-23 NOTE — Progress Notes (Signed)
[facility] [address] [place] [address] Phone: [phone]   Assessment and Plan:     1. Chronic right shoulder pain -Chronic with exacerbation, initial sports medicine visit - 6 weeks of recurrent right shoulder pain with history of rotator cuff and biceps tendon repair.  Most consistent with recurrent rotator cuff strain.  No evidence of large degree tear on ultrasound or based off of physical exam - Patient elected for subacromial CSI.  Tolerated well per note below - Start HEP for rotator cuff  Procedure: Subacromial Injection Side: Right  Risks explained and consent was given verbally. The site was cleaned with alcohol prep. A steroid injection was performed from posterior approach using 86mL of 1% lidocaine without epinephrine and 70mL of kenalog 40mg /ml. This was well tolerated and resulted in symptomatic relief.  Needle was removed, hemostasis achieved, and post injection instructions were explained.   Pt was advised to call or return to clinic if these symptoms worsen or fail to improve as anticipated.   Sports Medicine: Musculoskeletal Ultrasound.   Exam:Right Complete Shoulder Exam.  Diagnosis: Right shoulder pain  Biceps tendon: Normal Subscapularis: Normal Supraspinatus:   cortical changes at the insertion site of supraspinatus with history of supraspinatus tear status post repair.  No new tear seen on exam Infraspinatus/teres minor: Normal Glenohumeral joint (posterior): Normal Acromioclavicular joint:  Widened without pain, laxity, or TTP with history of shoulder arthroscopy Additional findings: None    Impression:  Supraspinatus strain    Pertinent previous records reviewed include none   Follow Up: 3 weeks for reevaluation.  Could consider x-ray versus advanced imaging based on symptoms   Subjective:   I, Carlos Tapia, am serving as a Neurosurgeon for Doctor Richardean Sale  Chief Complaint: right  shoulder pain   HPI:   01/24/2022 Patient is a 51 year old male complaining of right shoulder pain. Patient states severe right shoulder pain lasting days, sharp, without radiation but associated with sharp pain tender at the right lateral neck as well, for 6 wks.  Only acts up after lifting weights sessions, severe then better over next 2-3 days, but keeps happening each time.  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   Current Outpatient Medications:    cyclobenzaprine (FLEXERIL) 5 MG tablet, Take 1 tablet (5 mg total) by mouth 3 (three) times daily as needed., Disp: 40 tablet, Rfl: 1   lamoTRIgine (LAMICTAL) 25 MG tablet, Take one tab daily for one week and than two pills a day, Disp: 60 tablet, Rfl: 1   predniSONE (DELTASONE) 10 MG tablet, 2 tabs by mouth per day for 5 days, Disp: 10 tablet, Rfl: 0   traMADol (ULTRAM) 50 MG tablet, Take 1 tablet (50 mg total) by mouth every 6 (six) hours as needed., Disp: 30 tablet, Rfl: 0   Objective:  Vitals:   01/24/22 1533  BP: 118/72  Pulse: 90  SpO2: 97%  Weight: 220 lb (99.8 kg)  Height: 5\' 10"  (1.778 m)      Body mass index is 31.57 kg/m.    Physical Exam:    Gen: Appears well, nad, nontoxic and pleasant Neuro:sensation intact, strength is 5/5 with df/pf/inv/ev, muscle tone wnl Skin: no suspicious lesion or defmority Psych: A&O, appropriate mood and  affect  Right shoulder: no deformity, swelling or muscle wasting No scapular winging FF 180, abd 180, int 0, ext 90 NTTP over the [place], clavicle, ac, coracoid, biceps groove, humerus, deltoid, trapezius, cervical spine Positive Hawking's, empty can, O'Brien's Neg neer,   subscap liftoff, speeds,  , crossarm Neg ant drawer, sulcus sign, apprehension Negative Spurling's test bilat FROM of neck    Electronically signed by:  Benito Mccreedy D.[facility] 4:02 PM 01/24/22

## 2022-01-24 ENCOUNTER — Ambulatory Visit: Payer: BC Managed Care – PPO | Admitting: Sports Medicine

## 2022-01-24 ENCOUNTER — Ambulatory Visit: Payer: Self-pay

## 2022-01-24 VITALS — BP 118/72 | HR 90 | Ht 70.0 in | Wt 220.0 lb

## 2022-01-24 DIAGNOSIS — M25511 Pain in right shoulder: Secondary | ICD-10-CM | POA: Diagnosis not present

## 2022-01-24 DIAGNOSIS — G8929 Other chronic pain: Secondary | ICD-10-CM | POA: Diagnosis not present

## 2022-01-24 NOTE — Patient Instructions (Addendum)
Good to see you  Shoulder HEP 2-3 week follow up  

## 2022-01-31 ENCOUNTER — Telehealth (HOSPITAL_BASED_OUTPATIENT_CLINIC_OR_DEPARTMENT_OTHER): Payer: BC Managed Care – PPO | Admitting: Psychiatry

## 2022-01-31 ENCOUNTER — Telehealth (HOSPITAL_COMMUNITY): Payer: BC Managed Care – PPO | Admitting: Psychiatry

## 2022-01-31 ENCOUNTER — Encounter (HOSPITAL_COMMUNITY): Payer: Self-pay | Admitting: Psychiatry

## 2022-01-31 DIAGNOSIS — T1490XA Injury, unspecified, initial encounter: Secondary | ICD-10-CM | POA: Diagnosis not present

## 2022-01-31 DIAGNOSIS — F1210 Cannabis abuse, uncomplicated: Secondary | ICD-10-CM | POA: Diagnosis not present

## 2022-01-31 DIAGNOSIS — F319 Bipolar disorder, unspecified: Secondary | ICD-10-CM | POA: Diagnosis not present

## 2022-01-31 MED ORDER — LAMOTRIGINE 100 MG PO TABS: 100.0000 mg | ORAL_TABLET | Freq: Every day | ORAL | 1 refills | Status: DC

## 2022-01-31 NOTE — Progress Notes (Signed)
Virtual Visit via Video Note  I connected with Carlos Tapia on 01/31/22 at  3:20 PM EDT by a video enabled telemedicine application and verified that I am speaking with the correct person using two identifiers.  Location: Patient: Home Provider: Home Office   I discussed the limitations of evaluation and management by telemedicine and the availability of in person appointments. The patient expressed understanding and agreed to proceed.  History of Present Illness: Patient is evaluated by video session.  He is a 51 year old Caucasian man with a history of mood swing, anger, racing thoughts and having issues at work.  He was having passive and fleeting suicidal thoughts and feel hopeless and worthless.  We started him on Lamictal.  He liked the Lamictal and in the wriggling he really see a good response but after taking the 2 pills he noticed slipping back into symptoms.  He started taking third pill and he noticed improvement in his mood.  Patient told his wife also noticed that he is not as stressed and not getting agitated.  Patient feels his job is much manageable and he is not getting upset and he does not think about his past as he used to.  He had a difficult childhood and sometime he gets frustrated when he thinks about his past.  His job handling much better and he denies any crying spells or any feeling of hopelessness or anger.  He used to get very high heart rate when he see himself very anxious which has been not an issue anymore.  He is sleeping better.  He does not get upset easily.  We have recommended therapy but his job schedule is very busy and he has not time to call the places.  He recently saw his PCP and sports medicine for his shoulder pain.  He was given steroids which he recently finished.  He has upcoming appointment with sports medicine in few weeks.  Today is his daughter's birthday who is going to be 39.  Patient has planned to spend holidays with the family.  Patient is  pleased that his 2 year old daughter is doing Scientist, water quality in Clinical biochemist and like to work as a Child psychotherapist.  Patient reported his appetite is okay.  He is more active.  He denies any suicidal thoughts.  He has no tremors, shakes or any EPS.  He continued to smoke marijuana but no other drugs.  Patient works as a Location manager in a Scientist, research (physical sciences).  He lives with his wife who is very supportive.  He has a 58 years old son and a 3 year old daughter.     Past Psychiatric History: History of difficult childhood.  Involvement in gang crime and served jail time for 3 years and 3 years probation.  Never seen psychiatrist, counseling or any psychotropic medication.  No history of suicidal attempt.  History of mood swing, anger issues.  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  Smoked marijuana on a daily basis.  No history of DUI.  Psychiatric Specialty Exam: Physical Exam  Review of Systems  Weight 220 lb (99.8 kg).Body mass index is 31.57 kg/m.  General Appearance: Fairly Groomed  Eye Contact:  Good  Speech:  Normal Rate  Volume:  Normal  Mood:  Anxious  Affect:  Congruent  Thought Process:  Descriptions of Associations: Intact  Orientation:  Full (Time, Place, and Person)  Thought Content:  Rumination  Suicidal Thoughts:  No  Homicidal Thoughts:  No  Memory:  Immediate;   Good Recent;   Good Remote;   Fair  Judgement:  Fair  Insight:  Shallow  Psychomotor Activity:  Normal  Concentration:  Concentration: Fair and Attention Span: Fair  Recall:  Good  Fund of Knowledge:  Good  Language:  Good  Akathisia:  No  Handed:  Right  AIMS (if indicated):     Assets:  Communication Skills Desire for [place] Talents/Skills Transportation  ADL's:  Intact  Cognition:  WNL  Sleep:   Improved      Assessment and Plan: Bipolar disorder type I.  Trauma in childhood.  Mild cannabis use.  Patient doing better with the  Lamictal and any he has no side effects.  He started taking 3 pills of 25 mg after he noticed his symptoms coming back.  We discussed medication side effects and benefits.  We discussed therapeutic dose of the Lamictal and recommend to try 100 mg daily which he agree.  So far he has no concerns from the medication.  I also encouraged should consider therapy as he has a difficult childhood and patient is going to look into it but so far his job schedule is very busy.  I discussed medication side effects and benefits.  Recommended to call us back if is any question or any concern.  His sleep is improved.  We will follow-up in 2 months.  Follow Up Instructions:    I discussed the assessment and treatment plan with the patient. The patient was provided an opportunity to ask questions and all were answered. The patient agreed with the plan and demonstrated an understanding of the instructions.   The patient was advised to call back or seek an in-person evaluation if the symptoms worsen or if the condition fails to improve as anticipated.  Collaboration of Care: Other provider involved in patient's care AEB notes are available in epic to review.  Patient/Guardian was advised Release of Information must be obtained prior to any record release in order to collaborate their care with an outside provider. Patient/Guardian was advised if they have not already done so to contact the registration department to sign all necessary forms in order for [place] to release information regarding their care.   Consent: Patient/Guardian gives verbal consent for treatment and assignment of benefits for services provided during this visit. Patient/Guardian expressed understanding and agreed to proceed.    I provided 28 minutes of non-face-to-face time during this encounter.   Kathlee Nations, MD

## 2022-02-07 NOTE — Progress Notes (Deleted)
    Benito Mccreedy D.[place] Phone: [phone]   Assessment and Plan:     There are no diagnoses linked to this encounter.  ***   Pertinent previous records reviewed include ***   Follow Up: ***     Subjective:   I, Maxcine Strong, am serving as a Education administrator for Doctor Glennon Mac   Chief Complaint: right shoulder pain    HPI:    01/24/2022 Patient is a 51 year old male complaining of right shoulder pain. Patient states severe right shoulder pain lasting days, sharp, without radiation but associated with sharp pain tender at the right lateral neck as well, for 6 wks.  Only acts up after lifting weights sessions, severe then better over next 2-3 days, but keeps happening each time.  Has FROM of the shoulder, but has had prior right rotater cuff surgury with what sounds like right bicipital tendon reattachment.a month and a half ago he lifted a Ecologist and he felt a sharp pain , took a month off and worked out and felt that sharpe shooting pain , is afraid he re-tore something , intermittent stabbing pain , pain radiates up into his neck , no meds for the pain, no numbness or tingling, ROM is normal when he is raising hand straight  up he gets a stabbing pain ,  02/08/2022 Patient states   Relevant Historical Information: History of right rotator cuff repair and arthroscopy, GERD   Additional pertinent review of systems negative.   Current Outpatient Medications:    cyclobenzaprine (FLEXERIL) 5 MG tablet, Take 1 tablet (5 mg total) by mouth 3 (three) times daily as needed., Disp: 40 tablet, Rfl: 1   lamoTRIgine (LAMICTAL) 100 MG tablet, Take 1 tablet (100 mg total) by mouth daily., Disp: 30 tablet, Rfl: 1   predniSONE (DELTASONE) 10 MG tablet, 2 tabs by mouth per day for 5 days, Disp: 10 tablet, Rfl: 0   traMADol (ULTRAM) 50 MG tablet, Take 1 tablet (50 mg total) by mouth every 6 (six) hours as needed.,  Disp: 30 tablet, Rfl: 0   Objective:     There were no vitals filed for this visit.    There is no height or weight on file to calculate BMI.    Physical Exam:    ***   Electronically signed by:  Benito Mccreedy D.[facility] 7:36 AM 02/07/22

## 2022-02-08 ENCOUNTER — Ambulatory Visit: Payer: BC Managed Care – PPO | Admitting: Sports Medicine

## 2022-03-30 NOTE — Progress Notes (Signed)
I, Carlos Tapia, LAT, ATC acting as a scribe for Carlos Graham, MD.  Carlos Tapia is a 51 y.o. male who presents to [facility] today for continued chronic right shoulder pain.  Patient was previously seen by Dr. Jean Rosenthal on 01/24/2022 and was given a right subacromial steroid injection.  Patient has a history of rotator cuff and biceps tendon repair.  Today, patient reports pain has now moved into his supraclavicular-to-R-side of his neck.   This pain started in May after lifting a dresser and has been ongoing since.  Aggravates: pulling/pushing objects,  Treatments tried: Shoulder home exercise program previously taught.  Dx imaging: 04/29/2020 R shoulder MRI  03/12/2014 R shoulder XR  Pertinent review of systems: No fevers or chills.  No neck swelling.  No night sweats or weight loss.  Relevant historical information: History of rotator cuff tendon tear and biceps tendon tear and early 2022 requiring surgery by Dr. Renaye Rakers.   Exam:  BP (!) 168/106   Pulse 61   Ht 5\' 10"  (1.778 m)   Wt 230 lb (104.3 kg)   SpO2 96%   BMI 33.00 kg/m  General: Well Developed, well nourished, and in no acute distress.   MSK: Right anterior neck normal-appearing with no visible swelling.  No palpable swelling or lymphadenopathy or thyroid enlargement in the anterior right neck. [name] joint is normal-appearing and nontender. Normal cervical motion.  Pain is reproducible with shoulder shrug. Shoulder motion is intact without significant pain.    Lab and Radiology Results  Diagnostic Limited MSK Ultrasound of: Right [place] joint and anterior neck Right [name] joint is slightly enlarged compared to left. However there is no clear or obvious joint effusion. The soft tissue in the inferior anterior neck is relatively normal-appearing.  Vascular structures are normal.  The The thyroid gland appears to be symmetrical with no obvious cystic change. Impression: Relatively  normal-appearing  joint and anterior neck.   X-ray images chest, right clavicle, and C-spine obtained today personally and independently interpreted  C-spine: Mild DDD at C5-6.  No acute fractures.  No significant abnormality visualized.  Soft tissue structures normal-appearing  Right clavicle: Normal-appearing right clavicle with no acute fracture.  2 view chest x-ray: No clear abnormality right lung apices  Await formal radiology review   Assessment and Plan: 51 y.o. male with chronic right anterior neck pain around the proximal right clavicle.  This occurred after lifting a heavy object over 6 months ago.  He has failed to improve with typical conservative management including home exercise program and attempted treating his shoulder with a subacromial shoulder injection.  Will try some formal physical therapy focused on the anterior neck musculature that anchors out of the clavicle.  If this is not improved significantly would consider more dedicated advanced imaging such as CT scan or MRI of the structures. Prescribed tizanidine as well which could help.  Recommend Tylenol and ibuprofen.   PDMP not reviewed this encounter. Orders Placed This Encounter  Procedures   54 LIMITED JOINT SPACE STRUCTURES UP RIGHT(NO LINKED CHARGES)    Order Specific Question:   Reason for Exam (SYMPTOM  OR DIAGNOSIS REQUIRED)    Answer:   Right shoulder pain    Order Specific Question:   Preferred imaging location?    Answer:   [facility]   Adult nurse LIMITED JOINT SPACE STRUCTURES LOW RIGHT(NO LINKED CHARGES)    Order Specific Question:   Reason for Exam (SYMPTOM  OR  DIAGNOSIS REQUIRED)    Answer:   right shoulder pain    Order Specific Question:   Preferred imaging location?    Answer:   [facility]   DG Chest 2 View    Standing Status:   Future    Number of Occurrences:   1    Standing Expiration Date:   04/01/2023    Order Specific Question:   Reason  for Exam (SYMPTOM  OR DIAGNOSIS REQUIRED)    Answer:   eval rt upper chest pain    Order Specific Question:   Preferred imaging location?    Answer:   [name]   DG Clavicle Right    Standing Status:   Future    Number of Occurrences:   1    Standing Expiration Date:   04/01/2023    Order Specific Question:   Reason for Exam (SYMPTOM  OR DIAGNOSIS REQUIRED)    Answer:   et clavical pain proximal    Order Specific Question:   Preferred imaging location?    Answer:   [name]   DG Cervical Spine 2 or 3 views    Standing Status:   Future    Number of Occurrences:   1    Standing Expiration Date:   04/01/2023    Order Specific Question:   Reason for Exam (SYMPTOM  OR DIAGNOSIS REQUIRED)    Answer:   right anter cervical pain    Order Specific Question:   Preferred imaging location?    Answer:   [name]   Ambulatory referral to Physical Therapy    Referral Priority:   Routine    Referral Type:   Physical Medicine    Referral Reason:   Specialty Services Required    Requested Specialty:   Physical Therapy    Number of Visits Requested:   1   Meds ordered this encounter  Medications   tiZANidine (ZANAFLEX) 4 MG tablet    Sig: Take 1 tablet (4 mg total) by mouth every 8 (eight) hours as needed for muscle spasms.    Dispense:  30 tablet    Refill:  1     Discussed warning signs or symptoms. Please see discharge instructions. Patient expresses understanding.   The above documentation has been reviewed and is accurate and complete Lynne Leader, M.D.

## 2022-03-31 ENCOUNTER — Ambulatory Visit: Payer: BC Managed Care – PPO | Admitting: Family Medicine

## 2022-03-31 ENCOUNTER — Ambulatory Visit (INDEPENDENT_AMBULATORY_CARE_PROVIDER_SITE_OTHER): Payer: BC Managed Care – PPO

## 2022-03-31 ENCOUNTER — Ambulatory Visit: Payer: Self-pay

## 2022-03-31 VITALS — BP 168/106 | HR 61 | Ht 70.0 in | Wt 230.0 lb

## 2022-03-31 DIAGNOSIS — M25511 Pain in right shoulder: Secondary | ICD-10-CM

## 2022-03-31 DIAGNOSIS — R079 Chest pain, unspecified: Secondary | ICD-10-CM | POA: Diagnosis not present

## 2022-03-31 DIAGNOSIS — G8929 Other chronic pain: Secondary | ICD-10-CM | POA: Diagnosis not present

## 2022-03-31 DIAGNOSIS — M898X1 Other specified disorders of bone, shoulder: Secondary | ICD-10-CM | POA: Diagnosis not present

## 2022-03-31 DIAGNOSIS — M542 Cervicalgia: Secondary | ICD-10-CM

## 2022-03-31 DIAGNOSIS — M47812 Spondylosis without myelopathy or radiculopathy, cervical region: Secondary | ICD-10-CM | POA: Diagnosis not present

## 2022-03-31 DIAGNOSIS — M19011 Primary osteoarthritis, right shoulder: Secondary | ICD-10-CM | POA: Diagnosis not present

## 2022-03-31 MED ORDER — TIZANIDINE HCL 4 MG PO TABS: 4.0000 mg | ORAL_TABLET | Freq: Three times a day (TID) | ORAL | 1 refills | Status: DC | PRN

## 2022-03-31 NOTE — Patient Instructions (Signed)
Thank you for coming in today.   Please get an Xray today before you leave   I've referred you to Physical Therapy.  Let us know if you don't hear from them in one week.   Use the tizinadine as needed mostly around bedtime for significant pain.   Recheck in 1 month.   If this is not improving or we find something weird on the xray I will change the plan early.

## 2022-04-04 ENCOUNTER — Encounter (HOSPITAL_COMMUNITY): Payer: Self-pay | Admitting: Psychiatry

## 2022-04-04 ENCOUNTER — Telehealth (HOSPITAL_BASED_OUTPATIENT_CLINIC_OR_DEPARTMENT_OTHER): Payer: BC Managed Care – PPO | Admitting: Psychiatry

## 2022-04-04 VITALS — Wt 226.0 lb

## 2022-04-04 DIAGNOSIS — F1210 Cannabis abuse, uncomplicated: Secondary | ICD-10-CM | POA: Diagnosis not present

## 2022-04-04 DIAGNOSIS — T1490XA Injury, unspecified, initial encounter: Secondary | ICD-10-CM

## 2022-04-04 DIAGNOSIS — F319 Bipolar disorder, unspecified: Secondary | ICD-10-CM | POA: Diagnosis not present

## 2022-04-04 MED ORDER — LAMOTRIGINE 150 MG PO TABS: 150.0000 mg | ORAL_TABLET | Freq: Every day | ORAL | 1 refills | Status: DC

## 2022-04-04 MED ORDER — HYDROXYZINE HCL 10 MG PO TABS: 10.0000 mg | ORAL_TABLET | Freq: Every evening | ORAL | 1 refills | Status: DC | PRN

## 2022-04-04 NOTE — Progress Notes (Signed)
Cervical spine x-ray shows some arthritis changes.

## 2022-04-04 NOTE — Progress Notes (Signed)
Virtual Visit via Video Note  I connected with Carlos Tapia on 04/04/22 at  4:20 PM EST by a video enabled telemedicine application and verified that I am speaking with the correct person using two identifiers.  Location: Patient: Home Provider: Home Office   I discussed the limitations of evaluation and management by telemedicine and the availability of in person appointments. The patient expressed understanding and agreed to proceed.  History of Present Illness: Patient is evaluated by video session.  He is feeling better since we increased the Lamictal.  He is taking 100 mg daily.  He also feels proud that he stopped marijuana but his biggest concern is poor sleep.  He feels his mood is stable but having racing thoughts at night and could not sleep.  He reported his agitation, irritability, highs and lows and anger is much better and his wife also noticed improvement in his mood symptoms.  He is handling situation much better.  He had a good holidays.  He does not get upset easily.  Patient lives with his wife and his 14 year old daughter who is doing Oceanographer in Industrial/product designer.  His appetite is okay.  His weight is stable.  His plan is to stay away from marijuana.  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  He also have 85 year old son.   Past Psychiatric History: History of difficult childhood.  Involvement in gang crime and served jail time for 3 years and 3 years probation.  Never seen psychiatrist, counseling or any psychotropic medication.  No history of suicidal attempt.  History of mood swing, anger issues.  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  Smoked marijuana on a daily basis.  No history of DUI.   Psychiatric Specialty Exam: Physical Exam  Review of Systems  Weight 226 lb (102.5 kg).Body mass index is 32.43 kg/m.  General Appearance: Casual and beard  Eye Contact:  Good  Speech:  Clear and Coherent  Volume:  Normal   Mood:  Euthymic  Affect:  Appropriate  Thought Process:  Goal Directed  Orientation:  Full (Time, Place, and Person)  Thought Content:  Logical  Suicidal Thoughts:  No  Homicidal Thoughts:  No  Memory:  Immediate;   Good Recent;   Good Remote;   Good  Judgement:  Intact  Insight:  Present  Psychomotor Activity:  NA  Concentration:  Concentration: Fair and Attention Span: Fair  Recall:  Good  Fund of Knowledge:  Good  Language:  Good  Akathisia:  No  Handed:  Right  AIMS (if indicated):     Assets:  Communication Skills Desire for Improvement Housing Resilience Social Support Transportation  ADL's:  Intact  Cognition:  WNL  Sleep:   fair      Assessment and Plan: Bipolar disorder type I.  Trauma in childhood.  Mild cannabis use.  Patient doing better with the Lamictal but also interested to try higher dose of Lamictal to help his sleep.  So far no side effects of medication.  He has no tremor or shakes or any rash.  I recommend try Lamictal 150 mg daily.  I also add low-dose hydroxyzine to help his sleep and anxiety.  He can take 10-20 mg at bedtime.  We have recommended to consider therapy as he has a difficult childhood but his job is very busy and he did not have time for therapy.  Reminded medication side effects and benefits.  Recommended to call us back if is any question or any concern.  Follow-up in 2 months.  Follow Up Instructions:    I discussed the assessment and treatment plan with the patient. The patient was provided an opportunity to ask questions and all were answered. The patient agreed with the plan and demonstrated an understanding of the instructions.   The patient was advised to call back or seek an in-person evaluation if the symptoms worsen or if the condition fails to improve as anticipated.  Collaboration of Care: Other provider involved in patient's care AEB notes are available in epic to review.  Patient/Guardian was advised Release of  Information must be obtained prior to any record release in order to collaborate their care with an outside provider. Patient/Guardian was advised if they have not already done so to contact the registration department to sign all necessary forms in order for [place] to release information regarding their care.   Consent: Patient/Guardian gives verbal consent for treatment and assignment of benefits for services provided during this visit. Patient/Guardian expressed understanding and agreed to proceed.    I provided 12 minutes of non-face-to-face time during this encounter.   Kathlee Nations, MD

## 2022-04-04 NOTE — Progress Notes (Signed)
Chest x-ray looks normal to radiology.

## 2022-04-04 NOTE — Progress Notes (Signed)
Right collarbone shows a little bit of arthritis mostly around the shoulder joint.  No significant abnormalities at the region where you hurt.

## 2022-04-10 ENCOUNTER — Ambulatory Visit (INDEPENDENT_AMBULATORY_CARE_PROVIDER_SITE_OTHER): Payer: BC Managed Care – PPO | Admitting: Physical Therapy

## 2022-04-10 ENCOUNTER — Encounter: Payer: Self-pay | Admitting: Physical Therapy

## 2022-04-10 DIAGNOSIS — M542 Cervicalgia: Secondary | ICD-10-CM | POA: Diagnosis not present

## 2022-04-10 DIAGNOSIS — G8929 Other chronic pain: Secondary | ICD-10-CM | POA: Diagnosis not present

## 2022-04-10 DIAGNOSIS — M25511 Pain in right shoulder: Secondary | ICD-10-CM

## 2022-04-10 NOTE — Therapy (Signed)
OUTPATIENT PHYSICAL THERAPY SHOULDER EVALUATION   Patient Name: Carlos Tapia MRN: [id number] DOB:07/17/1970, 52 y.o., male Today's Date: 04/10/2022  END OF SESSION:  PT End of Session - 04/10/22 1503     Visit Number 1    Number of Visits 15    Date for PT Re-Evaluation 07/03/22    Authorization Type bcbs VL 15    Authorization - Visit Number 1    Authorization - Number of Visits 15    Progress Note Due on Visit 10    PT Start Time 1508    PT Stop Time 1553    PT Time Calculation (min) 45 min             Past Medical History:  Diagnosis Date   Blood in stool    Chicken pox    GERD (gastroesophageal reflux disease)    Migraines    Shingles 2009, 2010   History reviewed. No pertinent surgical history. Patient Active Problem List   Diagnosis Date Noted   Hyperglycemia 10/03/2021   HLD (hyperlipidemia) 10/03/2021   Low testosterone in male 10/03/2021   Encounter for well adult exam with abnormal findings 10/03/2021   Anxiety and depression 10/03/2021   Sleep apnea in adult 04/08/2020   GERD (gastroesophageal reflux disease) 04/08/2020   Chronic right shoulder pain 04/08/2020   Left lumbar radiculopathy 06/10/2019   Ulnar neuritis, left 03/06/2019   Allergic rhinitis 01/21/2019   Asthma 01/21/2019   Rupture of plantaris tendon, left, initial encounter 03/15/2017   Hypersomnolence 11/28/2016   Hematochezia 11/28/2016   Morbid obesity (HCC) 09/06/2015   Acute sinus infection 05/19/2015   Insomnia 08/19/2014   Fatigue 08/19/2014   Blood pressure elevated without history of HTN 08/19/2014    PCP: Corwin Levins, MD  REFERRING PROVIDER: Rodolph Bong, MD  REFERRING DIAG:  [phone] (ICD-10-CM) - Chronic right shoulder pain M54.2 (ICD-10-CM) - Anterior neck pain M89.8X1 (ICD-10-CM) - Pain of right clavicle  THERAPY DIAG:  Cervicalgia  Chronic right shoulder pain  Rationale for Evaluation and Treatment: Rehabilitation  ONSET DATE: 5 months ago    SUBJECTIVE:                                                                                                                                                                                      SUBJECTIVE STATEMENT: States that he was lifting a night stand about 5 months ago and he jerked it and he had a sharp pain in his neck and into his shoulder. States that anytime he lifts anything heavy or reaches outside of his body he has a pulling pain in the the front of his shoulder  neck. States that his shoulder bothers him during the day but it is more annoying. States that he hasn't gotten back to his workout routine (was shoulder pressing 200#). Last injection helped with shoulder pain but continues to hurt along clavicle and neck.    PERTINENT HISTORY:  R RCR and biceps repair about 2 years ago   PAIN:  Are you having pain? Yes: NPRS scale: 1 - a rest, 5 at worst /10 Pain location: front of clavicle Pain description: sore sharp, tugging Aggravating factors: moving right arm and lifting Relieving factors: medication  PRECAUTIONS: None  WEIGHT BEARING RESTRICTIONS: No  FALLS:  Has patient fallen in last 6 months? No    OCCUPATION: works at [employer] and has to push with his arms during the day.  PLOF: Independent  PATIENT GOALS:to get back to lifting weights   OBJECTIVE:   DIAGNOSTIC FINDINGS:  Xray of clavicle Right FINDINGS: Increased acromial clavicular distance may be related to previous surgical intervention. Subchondral cyst formation humeral head. Glenohumeral mild degenerative changes. No osteolytic or osteoblastic process. No acute traumatic abnormalities.   IMPRESSION: Degenerative and postop changes. No acute osseous abnormalities identified.  Xray of cervical spine FINDINGS: No fracture, dislocation or subluxation. No spondylolisthesis. No osteolytic or osteoblastic changes. Prevertebral and cervical cranial soft tissues are unremarkable.    Degenerative disc disease noted with disc space narrowing and marginal osteophytes at C4-5-6. To evaluate for disc disease as a potential etiology of radicular symptoms, consider C-spine MRI.   IMPRESSION: Degenerative changes. No acute osseous abnormalities.  PATIENT SURVEYS:  FOTO 49% function  COGNITION: Overall cognitive status: Within functional limits for tasks assessed     SENSATION: WFL  POSTURE: holds right arm to chest with shoulder blade anterior tilted and abd   Cervical  A/PROM:    04/10/2022      Flexion WFL      Extension  30     R ROT 70      L ROT 45*(compensates with ext)      R SB WFL      L SB 50% limited*     * Pain   (Blank rows = not tested)    UE Measurements Upper Extremity Right 04/10/2022 Left 04/10/2022   A/PROM MMT A/PROM MMT  Shoulder Flexion 155 4* 155 4+  Shoulder Extension      Shoulder Abduction      Shoulder Adduction      Shoulder Internal Rotation 30* 4* 60 4+  Shoulder External Rotation 80 4+ 90 4+  Elbow Flexion      Elbow Extension      Wrist Flexion      Wrist Extension      Wrist Supination      Wrist Pronation      Wrist Ulnar Deviation      Wrist Radial Deviation      Grip Strength NA  NA     (Blank rows = not tested)   * pain   Right handed     JOINT MOBILITY TESTING:  Muscle guarding in right shoulder  PALPATION:  Tenderness to palpation along right SCM/UT, pec and scalenes. Swelling in right neck and right pec region   TODAY'S TREATMENT:  DATE:  04/10/2022  Therapeutic Exercise:  Aerobic: Supine: long exhale for core engagement 5 minutes, self massage for edema to neck and shoulder Prone:  Seated: shoulder ER/IR pain free motion B x15   Standing: Neuromuscular Re-education: Manual Therapy:edema massage to right side of neck and pec region 5 minutes Therapeutic  Activity: Self Care: Trigger Point Dry Needling:  Modalities:    PATIENT EDUCATION:  Education details: on current presentation, on HEP, on clinical outcomes score and POC, on rationale behind interventions, on current movement dysfunctions.  Person educated: Patient Education method: Explanation, Demonstration, and Handouts Education comprehension: verbalized understanding   HOME EXERCISE PROGRAM: See pt instructions  ASSESSMENT:  CLINICAL IMPRESSION: Patient presents with pain in right shoulder girdle that started 1/2 a year ago after jerking a dresser when trying to lift it. Patient has history of right RCR and history of obesity. Patient demonstrates weakness, limited ROM and poor scapulahumeral movements likely contributing to current presentation. Educated patient in current presentation and patient would greatly benefit from skilled PT to improve QOL and overall function.   OBJECTIVE IMPAIRMENTS: decreased activity tolerance, decreased endurance, decreased ROM, decreased strength, impaired UE functional use, postural dysfunction, and pain.   ACTIVITY LIMITATIONS: carrying, lifting, sitting, dressing, and reach over head  PARTICIPATION LIMITATIONS: cleaning, driving, occupation, yard work, and working out  PERSONAL FACTORS: Fitness and Time since onset of injury/illness/exacerbation are also affecting patient's functional outcome.   REHAB POTENTIAL: Good  CLINICAL DECISION MAKING: Stable/uncomplicated  EVALUATION COMPLEXITY: Low   GOALS: Goals reviewed with patient? yes  SHORT TERM GOALS: Target date: 05/22/2022  Patient will be independent in self management strategies to improve quality of life and functional outcomes. Baseline: New Program Goal status: INITIAL  2.  Patient will report at least 50% improvement in overall symptoms and/or function to demonstrate improved functional mobility Baseline: 0% better Goal status: INITIAL  3.  Patient will be able to sit  without holding his arm in add/IR to improve resting postures Baseline: current resting posture Goal status: INITIAL  4.  Patient will demonstrate no swelling in neck and pec region Baseline: swelling Goal status: INITIAL    LONG TERM GOALS: Target date: 07/03/2022   Patient will report at least 75% improvement in overall symptoms and/or function to demonstrate improved functional mobility Baseline: 0% better Goal status: INITIAL  2.  Patient will improve score on FOTO outcomes measure to projected score to demonstrate overall improved function and QOL Baseline: see above Goal status: INITIAL  3.  Patient will be able to dress without compensations or pain. Baseline: extends neck/painful Goal status: INITIAL  4.  Patient will demonstrate painfree neck and shoulder motion Baseline: painful Goal status: INITIAL   PLAN:  PT FREQUENCY: 1-2x/week for a total of 15 visits over 12 week certification  PT DURATION: 12 weeks  PLANNED INTERVENTIONS: Therapeutic exercises, Therapeutic activity, Neuromuscular re-education, Balance training, Gait training, Patient/Family education, Self Care, Joint mobilization, Joint manipulation, Vestibular training, Orthotic/Fit training, Aquatic Therapy, Dry Needling, Electrical stimulation, Spinal manipulation, Spinal mobilization, Cryotherapy, Moist heat, Traction, Ultrasound, Ionotophoresis 4mg /ml Dexamethasone, Manual therapy, and Re-evaluation  PLAN FOR NEXT SESSION: right shoulder/neck motion, edema massage, posture, isometrics, scapular activation (prone series)    4:45 PM, 04/10/22 06/09/22, DPT Physical Therapy with [facility]

## 2022-04-10 NOTE — Patient Instructions (Signed)
Lymph/swelling massage to upper neck/chest and armpit - perform circles and then longer strokes towards armpit - light pressure - 5-29minute  Long exhale blow out 100 birthday candles - goal to relax neck and shoulder during forceful exhale and to feel your core engage - 5 minutes/daily  Seated with elbows tucked in at sides bring hands out to side and then back towards belly x20- NO PAIN

## 2022-04-11 NOTE — Therapy (Addendum)
OUTPATIENT PHYSICAL THERAPY TREATMENT NOTE  PHYSICAL THERAPY DISCHARGE SUMMARY  Visits from Start of Care: 2  Current functional level related to goals / functional outcomes: Unable to assess due to unplanned discharge    Remaining deficits: Unable to assess due to unplanned discharge    Education / Equipment: Unable to assess due to unplanned discharge    Patient agrees to discharge. Patient goals were not met. Patient is being discharged due to not returning since the last visit.  1:51 PM, 06/01/22 Jerene Pitch, DPT Physical Therapy with [facility]   Patient Name: Carlos Tapia MRN: [id number] DOB:Jun 20, 1970, 52 y.o., male Today's Date: 04/12/2022  PCP: Biagio Borg, MD   REFERRING PROVIDER: Gregor Hams, MD  END OF SESSION:   PT End of Session - 04/12/22 1603     Visit Number 2    Number of Visits 15    Date for PT Re-Evaluation 07/03/22    Authorization Type bcbs VL 15    Authorization - Visit Number 2    Authorization - Number of Visits 15    Progress Note Due on Visit 10    PT Start Time [id number]    PT Stop Time [id number]    PT Time Calculation (min) 40 min             Past Medical History:  Diagnosis Date   Blood in stool    Chicken pox    GERD (gastroesophageal reflux disease)    Migraines    Shingles 2009, 2010   History reviewed. No pertinent surgical history. Patient Active Problem List   Diagnosis Date Noted   Hyperglycemia 10/03/2021   HLD (hyperlipidemia) 10/03/2021   Low testosterone in male 10/03/2021   Encounter for well adult exam with abnormal findings 10/03/2021   Anxiety and depression 10/03/2021   Sleep apnea in adult 04/08/2020   GERD (gastroesophageal reflux disease) 04/08/2020   Chronic right shoulder pain 04/08/2020   Left lumbar radiculopathy 06/10/2019   Ulnar neuritis, left 03/06/2019   Allergic rhinitis 01/21/2019   Asthma 01/21/2019   Rupture of plantaris tendon, left, initial encounter 03/15/2017    Hypersomnolence 11/28/2016   Hematochezia 11/28/2016   Morbid obesity ([place]) 09/06/2015   Acute sinus infection 05/19/2015   Insomnia 08/19/2014   Fatigue 08/19/2014   Blood pressure elevated without history of HTN 08/19/2014     THERAPY DIAG:  Cervicalgia  Chronic right shoulder pain  REFERRING DIAG:  M25.511,G89.29 (ICD-10-CM) - Chronic right shoulder pain M54.2 (ICD-10-CM) - Anterior neck pain M89.8X1 (ICD-10-CM) - Pain of right clavicle     Rationale for Evaluation and Treatment: Rehabilitation   ONSET DATE: 5 months ago    SUBJECTIVE:  SUBJECTIVE STATEMENT: 04/12/2022 States he was feeling better but then had to work in a different position today and that bothered him. States he has had less sharp pain in the front.   Eval: States that he was lifting a night stand about 5 months ago and he jerked it and he had a sharp pain in his neck and into his shoulder. States that anytime he lifts anything heavy or reaches outside of his body he has a pulling pain in the the front of his shoulder neck. States that his shoulder bothers him during the day but it is more annoying. States that he hasn't gotten back to his workout routine (was shoulder pressing 200#). Last injection helped with shoulder pain but continues to hurt along clavicle and neck.      PERTINENT HISTORY:  R RCR and biceps repair about 2 years ago    PAIN:  Are you having pain? Yes: NPRS scale: 1 /10 Pain location: back of the head  Pain description: dull  Aggravating factors: moving right arm and lifting Relieving factors: medication   PRECAUTIONS: None   WEIGHT BEARING RESTRICTIONS: No   FALLS:  Has patient fallen in last 6 months? No       OCCUPATION: works at [employer] and has to push with his arms during the day.    PLOF: Independent   PATIENT GOALS:to get back to lifting weights     OBJECTIVE:    DIAGNOSTIC FINDINGS:  Xray of clavicle Right FINDINGS: Increased acromial clavicular distance may be related to previous surgical intervention. Subchondral cyst formation humeral head. Glenohumeral mild degenerative changes. No osteolytic or osteoblastic process. No acute traumatic abnormalities.   IMPRESSION: Degenerative and postop changes. No acute osseous abnormalities identified.   Xray of cervical spine FINDINGS: No fracture, dislocation or subluxation. No spondylolisthesis. No osteolytic or osteoblastic changes. Prevertebral and cervical cranial soft tissues are unremarkable.   Degenerative disc disease noted with disc space narrowing and marginal osteophytes at C4-5-6. To evaluate for disc disease as a potential etiology of radicular symptoms, consider C-spine MRI.   IMPRESSION: Degenerative changes. No acute osseous abnormalities.   PATIENT SURVEYS:  FOTO 49% function   COGNITION: Overall cognitive status: Within functional limits for tasks assessed                                  SENSATION: WFL   POSTURE: holds right arm to chest with shoulder blade anterior tilted and abd              Cervical  A/PROM:    04/10/2022      Flexion WFL      Extension  30     R ROT 70      L ROT 45*(compensates with ext)      R SB WFL      L SB 50% limited*                          * Pain              (Blank rows = not tested)                UE Measurements       Upper Extremity Right 04/10/2022 Left 04/10/2022    A/PROM MMT A/PROM MMT  Shoulder Flexion 155 4* 155 4+  Shoulder Extension  Shoulder Abduction          Shoulder Adduction          Shoulder Internal Rotation 30* 4* 60 4+  Shoulder External Rotation 80 4+ 90 4+  Elbow Flexion          Elbow Extension          Wrist Flexion          Wrist Extension          Wrist Supination          Wrist Pronation           Wrist Ulnar Deviation          Wrist Radial Deviation          Grip Strength NA   NA                          (Blank rows = not tested)                       * pain                       Right handed        JOINT MOBILITY TESTING:  Muscle guarding in right shoulder   PALPATION:  Tenderness to palpation along right SCM/UT, pec and scalenes. Swelling in right neck and right pec region             TODAY'S TREATMENT:                                                                                                                                         DATE:  04/12/2022    Therapeutic Exercise:    Aerobic: Supine: chin tucks 5 minutes,shoulder flexion knees bent 5 minutes total, shoulder abd 4 minutes B, scapular shrugs and depression - 3x10 5" holds, tennis balls for suboccipital release- too aggressive, cervical ROT x10 B  Prone: shoulder I R 3x10 5" holds- prone shoulder retraction PROM PT assistance R 5 minutes    Seated:      Standing: Neuromuscular Re-education: Manual Therapy: STM to suboccipitals - 5 minutes, gentle cervical traction 4 minutes  Therapeutic Activity: Self Care: Trigger Point Dry Needling:  Modalities: thermotherapy to neck and shoulders in supine - during supine exercises.      PATIENT EDUCATION:  Education details: on HEP  Person educated: Patient Education method: Explanation, Media planner, and Handouts Education comprehension: verbalized understanding     HOME EXERCISE PROGRAM: See pt instructions and [id number]   ASSESSMENT:   CLINICAL IMPRESSION: 04/12/2022 Improved posture and tolerance today. Discussed reducing passive postures causing strain on anterior neck and shoulder. Added need exercises to HEP. Did not add tennis ball exercise to HEP secondary to tolerance. Overall improved scapular motion but scapular depression very difficult/painful/tight, improved with  PT assistance. Will continue with current POC as tolerated.   Eval: Patient  presents with pain in right shoulder girdle that started 1/2 a year ago after jerking a dresser when trying to lift it. Patient has history of right RCR and history of obesity. Patient demonstrates weakness, limited ROM and poor scapulahumeral movements likely contributing to current presentation. Educated patient in current presentation and patient would greatly benefit from skilled PT to improve QOL and overall function.    OBJECTIVE IMPAIRMENTS: decreased activity tolerance, decreased endurance, decreased ROM, decreased strength, impaired UE functional use, postural dysfunction, and pain.    ACTIVITY LIMITATIONS: carrying, lifting, sitting, dressing, and reach over head   PARTICIPATION LIMITATIONS: cleaning, driving, occupation, yard work, and working out   [place]: Fitness and Time since onset of injury/illness/exacerbation are also affecting patient's functional outcome.    REHAB POTENTIAL: Good   CLINICAL DECISION MAKING: Stable/uncomplicated   EVALUATION COMPLEXITY: Low     GOALS: Goals reviewed with patient? yes   SHORT TERM GOALS: Target date: 05/22/2022  Patient will be independent in self management strategies to improve quality of life and functional outcomes. Baseline: New Program Goal status: INITIAL   2.  Patient will report at least 50% improvement in overall symptoms and/or function to demonstrate improved functional mobility Baseline: 0% better Goal status: INITIAL   3.  Patient will be able to sit without holding his arm in add/IR to improve resting postures Baseline: current resting posture Goal status: INITIAL   4.  Patient will demonstrate no swelling in neck and pec region Baseline: swelling Goal status: INITIAL       LONG TERM GOALS: Target date: 07/03/2022    Patient will report at least 75% improvement in overall symptoms and/or function to demonstrate improved functional mobility Baseline: 0% better Goal status: INITIAL   2.  Patient will  improve score on FOTO outcomes measure to projected score to demonstrate overall improved function and QOL Baseline: see above Goal status: INITIAL   3.  Patient will be able to dress without compensations or pain. Baseline: extends neck/painful Goal status: INITIAL   4.  Patient will demonstrate painfree neck and shoulder motion Baseline: painful Goal status: INITIAL     PLAN:   PT FREQUENCY: 1-2x/week for a total of 15 visits over 12 week certification   PT DURATION: 12 weeks   PLANNED INTERVENTIONS: Therapeutic exercises, Therapeutic activity, Neuromuscular re-education, Balance training, Gait training, Patient/Family education, Self Care, Joint mobilization, Joint manipulation, Vestibular training, Orthotic/Fit training, Aquatic Therapy, Dry Needling, Electrical stimulation, Spinal manipulation, Spinal mobilization, Cryotherapy, Moist heat, Traction, Ultrasound, Ionotophoresis '4mg'$ /ml Dexamethasone, Manual therapy, and Re-evaluation   PLAN FOR NEXT SESSION: right shoulder/neck motion,   posture, isometrics, scapular activation (prone series)/ scapular mobility, STM as indicated     4:53 PM, 04/12/22 Jerene Pitch, DPT Physical Therapy with [facility]

## 2022-04-12 ENCOUNTER — Ambulatory Visit (INDEPENDENT_AMBULATORY_CARE_PROVIDER_SITE_OTHER): Payer: BC Managed Care – PPO | Admitting: Physical Therapy

## 2022-04-12 ENCOUNTER — Encounter: Payer: Self-pay | Admitting: Physical Therapy

## 2022-04-12 DIAGNOSIS — M25511 Pain in right shoulder: Secondary | ICD-10-CM | POA: Diagnosis not present

## 2022-04-12 DIAGNOSIS — G8929 Other chronic pain: Secondary | ICD-10-CM | POA: Diagnosis not present

## 2022-04-12 DIAGNOSIS — M542 Cervicalgia: Secondary | ICD-10-CM | POA: Diagnosis not present

## 2022-04-17 ENCOUNTER — Encounter (HOSPITAL_BASED_OUTPATIENT_CLINIC_OR_DEPARTMENT_OTHER): Payer: Self-pay | Admitting: Emergency Medicine

## 2022-04-17 ENCOUNTER — Emergency Department (HOSPITAL_BASED_OUTPATIENT_CLINIC_OR_DEPARTMENT_OTHER): Payer: BC Managed Care – PPO

## 2022-04-17 ENCOUNTER — Other Ambulatory Visit: Payer: Self-pay

## 2022-04-17 ENCOUNTER — Encounter: Payer: BC Managed Care – PPO | Admitting: Physical Therapy

## 2022-04-17 ENCOUNTER — Ambulatory Visit: Payer: BC Managed Care – PPO | Admitting: Internal Medicine

## 2022-04-17 ENCOUNTER — Emergency Department (HOSPITAL_BASED_OUTPATIENT_CLINIC_OR_DEPARTMENT_OTHER)
Admission: EM | Admit: 2022-04-17 | Discharge: 2022-04-17 | Disposition: A | Discharge status: Home / Self Care | Payer: BC Managed Care – PPO | Attending: Emergency Medicine | Admitting: Emergency Medicine

## 2022-04-17 ENCOUNTER — Encounter: Payer: Self-pay | Admitting: Internal Medicine

## 2022-04-17 VITALS — BP 138/82 | HR 68 | Temp 98.4°F | Ht 70.0 in | Wt 231.0 lb

## 2022-04-17 DIAGNOSIS — Z1152 Encounter for screening for COVID-19: Secondary | ICD-10-CM | POA: Diagnosis not present

## 2022-04-17 DIAGNOSIS — K6289 Other specified diseases of anus and rectum: Secondary | ICD-10-CM | POA: Insufficient documentation

## 2022-04-17 DIAGNOSIS — R1030 Lower abdominal pain, unspecified: Secondary | ICD-10-CM

## 2022-04-17 DIAGNOSIS — R55 Syncope and collapse: Secondary | ICD-10-CM | POA: Diagnosis not present

## 2022-04-17 DIAGNOSIS — K594 Anal spasm: Secondary | ICD-10-CM

## 2022-04-17 DIAGNOSIS — I700 Atherosclerosis of aorta: Secondary | ICD-10-CM | POA: Diagnosis not present

## 2022-04-17 DIAGNOSIS — I719 Aortic aneurysm of unspecified site, without rupture: Secondary | ICD-10-CM | POA: Diagnosis not present

## 2022-04-17 DIAGNOSIS — R42 Dizziness and giddiness: Secondary | ICD-10-CM | POA: Insufficient documentation

## 2022-04-17 DIAGNOSIS — R109 Unspecified abdominal pain: Secondary | ICD-10-CM | POA: Insufficient documentation

## 2022-04-17 LAB — POC COVID19 BINAXNOW: SARS Coronavirus 2 Ag: NEGATIVE

## 2022-04-17 LAB — COMPREHENSIVE METABOLIC PANEL
ALT: 19 U/L (ref 0–44)
AST: 17 U/L (ref 15–41)
Albumin: 4.7 g/dL (ref 3.5–5.0)
Alkaline Phosphatase: 73 U/L (ref 38–126)
Anion gap: 7 (ref 5–15)
BUN: 20 mg/dL (ref 6–20)
CO2: 29 mmol/L (ref 22–32)
Calcium: 9.6 mg/dL (ref 8.9–10.3)
Chloride: 103 mmol/L (ref 98–111)
Creatinine, Ser: 0.79 mg/dL (ref 0.61–1.24)
GFR, Estimated: >60 mL/min (ref >60)
Glucose, Bld: 84 mg/dL (ref 70–99)
Potassium: 4.1 mmol/L (ref 3.5–5.1)
Sodium: 139 mmol/L (ref 135–145)
Total Bilirubin: 0.2 mg/dL — ABNORMAL LOW (ref 0.3–1.2)
Total Protein: 7.4 g/dL (ref 6.5–8.1)

## 2022-04-17 LAB — CBC
HCT: 46.8 % (ref 39.0–52.0)
Hemoglobin: 15.4 g/dL (ref 13.0–17.0)
MCH: 30.6 pg (ref 26.0–34.0)
MCHC: 32.9 g/dL (ref 30.0–36.0)
MCV: 93 fL (ref 80.0–100.0)
Platelets: 252 10*3/uL (ref 150–400)
RBC: 5.03 MIL/uL (ref 4.22–5.81)
RDW: 13.2 % (ref 11.5–15.5)
WBC: 7.2 10*3/uL (ref 4.0–10.5)
nRBC: 0 % (ref 0.0–0.2)

## 2022-04-17 LAB — TROPONIN I (HIGH SENSITIVITY)
Troponin I (High Sensitivity): 3 ng/L (ref <18)
Troponin I (High Sensitivity): 3 ng/L (ref <18)

## 2022-04-17 LAB — URINALYSIS, ROUTINE W REFLEX MICROSCOPIC
Bacteria, UA: NONE SEEN
Bilirubin Urine: NEGATIVE
Glucose, UA: NEGATIVE mg/dL
Ketones, ur: NEGATIVE mg/dL
Leukocytes,Ua: NEGATIVE
Nitrite: NEGATIVE
Protein, ur: NEGATIVE mg/dL
Specific Gravity, Urine: 1.02 (ref 1.005–1.030)
pH: 5.5 (ref 5.0–8.0)

## 2022-04-17 LAB — LIPASE, BLOOD: Lipase: 44 U/L (ref 11–51)

## 2022-04-17 LAB — RESP PANEL BY RT-PCR (RSV, FLU A&B, COVID)  RVPGX2
Influenza A by PCR: NEGATIVE
Influenza B by PCR: NEGATIVE
Resp Syncytial Virus by PCR: NEGATIVE
SARS Coronavirus 2 by RT PCR: NEGATIVE

## 2022-04-17 LAB — GLUCOSE, POCT (MANUAL RESULT ENTRY): POC Glucose: 98 mg/dl (ref 70–99)

## 2022-04-17 MED ORDER — SODIUM CHLORIDE 0.9 % IV BOLUS
1000.0000 mL | Freq: Once | INTRAVENOUS | Status: AC
  Administered 2022-04-17: 1000 mL via INTRAVENOUS

## 2022-04-17 MED ORDER — LAMOTRIGINE 100 MG PO TABS: 100.0000 mg | ORAL_TABLET | Freq: Every day | ORAL | 1 refills | Status: DC

## 2022-04-17 MED ORDER — IOHEXOL 350 MG/ML SOLN
100.0000 mL | Freq: Once | INTRAVENOUS | Status: AC | PRN
  Administered 2022-04-17: 100 mL via INTRAVENOUS

## 2022-04-17 NOTE — Progress Notes (Signed)
Subjective:  Patient ID: Carlos Tapia, male    DOB: 05-14-1970  Age: 52 y.o. MRN: [id number]  CC: No chief complaint on file.   HPI Carlos Tapia presents for a severe abd pain the pelvic area - it made him pass out. EMS came and left.  Last night at night - he had this pain again and passed out. Today - no pain; almost passing out x2 Works w/[employer] Pt lost wt on diet On Lamictal since Nov 2023 for possible bipolar disorder; taking Zanaflex and a higher Lamictal dose x 1 week  H/o syncope in the past  He is here w/his wife - she will drive  Outpatient Medications Prior to Visit  Medication Sig Dispense Refill   lamoTRIgine (LAMICTAL) 150 MG tablet Take 1 tablet (150 mg total) by mouth daily. 30 tablet 1   tiZANidine (ZANAFLEX) 4 MG tablet Take 1 tablet (4 mg total) by mouth every 8 (eight) hours as needed for muscle spasms. 30 tablet 1   hydrOXYzine (ATARAX) 10 MG tablet Take 1-2 tablets (10-20 mg total) by mouth at bedtime as needed (insomnia). (Patient not taking: Reported on 04/17/2022) 30 tablet 1   No facility-administered medications prior to visit.    ROS: Review of Systems  Constitutional:  Positive for chills and diaphoresis. Negative for appetite change, fatigue and unexpected weight change.  HENT:  Negative for congestion, nosebleeds, sneezing, sore throat and trouble swallowing.   Eyes:  Negative for itching and visual disturbance.  Respiratory:  Negative for cough.   Cardiovascular:  Negative for chest pain, palpitations and leg swelling.  Gastrointestinal:  Positive for abdominal pain and rectal pain. Negative for abdominal distention, anal bleeding, blood in stool, diarrhea, nausea and vomiting.  Genitourinary:  Negative for frequency and hematuria.  Musculoskeletal:  Negative for back pain, gait problem, joint swelling and neck pain.  Skin:  Negative for rash.  Neurological:  Positive for syncope, weakness and light-headedness. Negative for dizziness,  tremors, seizures, facial asymmetry, speech difficulty and headaches.  Hematological:  Does not bruise/bleed easily.  Psychiatric/Behavioral:  Negative for agitation, dysphoric mood and sleep disturbance. The patient is nervous/anxious.   Weak, thirsty, sweaty  Objective:  BP 138/82 (BP Location: Right Arm, Patient Position: Sitting, Cuff Size: Large)   Pulse 68   Temp 98.4 F (36.9 C) (Oral)   Ht 5\' 10"  (1.778 m)   Wt 231 lb (104.8 kg)   SpO2 95%   BMI 33.15 kg/m   BP Readings from Last 3 Encounters:  04/17/22 138/82  03/31/22 (!) 168/106  01/24/22 118/72    Wt Readings from Last 3 Encounters:  04/17/22 231 lb (104.8 kg)  03/31/22 230 lb (104.3 kg)  01/24/22 220 lb (99.8 kg)    Physical Exam Constitutional:      General: He is in acute distress.     Appearance: He is well-developed. He is obese.     Comments: NAD  HENT:     Mouth/Throat:     Pharynx: No posterior oropharyngeal erythema.  Eyes:     Conjunctiva/sclera: Conjunctivae normal.     Pupils: Pupils are equal, round, and reactive to light.  Neck:     Thyroid: No thyromegaly.     Vascular: No JVD.  Cardiovascular:     Rate and Rhythm: Normal rate and regular rhythm.     Heart sounds: Normal heart sounds. No murmur heard.    No friction rub. No gallop.  Pulmonary:     Effort: Pulmonary effort  is normal. No respiratory distress.     Breath sounds: Normal breath sounds. No wheezing or rales.  Chest:     Chest wall: No tenderness.  Abdominal:     General: Bowel sounds are normal. There is no distension.     Palpations: Abdomen is soft. There is no mass.     Tenderness: There is no abdominal tenderness. There is no guarding or rebound.  Musculoskeletal:        General: No tenderness. Normal range of motion.     Cervical back: Normal range of motion.  Lymphadenopathy:     Cervical: No cervical adenopathy.  Skin:    General: Skin is warm and dry.     Findings: No rash.  Neurological:     Mental Status:  He is alert and oriented to person, place, and time.     Cranial Nerves: No cranial nerve deficit.     Motor: No weakness or abnormal muscle tone.     Coordination: Coordination normal.     Gait: Gait normal.     Deep Tendon Reflexes: Reflexes are normal and symmetric.  Psychiatric:        Behavior: Behavior normal.        Thought Content: Thought content normal.        Judgment: Judgment normal.    No orthostatic BP drop  Procedure: EKG Indication: syncope Impression: S brady. No acute changes.  Lab Results  Component Value Date   WBC 10.4 10/03/2021   HGB 14.8 10/03/2021   HCT 44.7 10/03/2021   PLT 234.0 10/03/2021   GLUCOSE 81 10/03/2021   CHOL 142 10/03/2021   TRIG 71.0 10/03/2021   HDL 39.50 10/03/2021   LDLCALC 88 10/03/2021   ALT 23 10/03/2021   AST 19 10/03/2021   NA 139 10/03/2021   K 4.4 10/03/2021   CL 102 10/03/2021   CREATININE 1.03 10/03/2021   BUN 33 (H) 10/03/2021   CO2 28 10/03/2021   TSH 1.64 10/03/2021   PSA 0.22 10/03/2021   HGBA1C 5.4 10/03/2021    MR SHOULDER RIGHT WO CONTRAST  Result Date: 04/30/2020 CLINICAL DATA:  Right shoulder pain for 6 weeks after injury EXAM: MRI OF THE RIGHT SHOULDER WITHOUT CONTRAST TECHNIQUE: Multiplanar, multisequence MR imaging of the shoulder was performed. No intravenous contrast was administered. COMPARISON:  X-ray 03/12/2014 FINDINGS: Rotator cuff: Full-thickness tear of the anterior aspect of the distal supraspinatus tendon with retraction to the level of the humeral head apex (series 7, images 11-13). Tear measures approximately 12 mm in AP dimension. Mild-moderate tendinosis of the supraspinatus, infraspinatus, and subscapularis tendons. Interstitial tear of the infraspinatus tendon with fluid tracking to the anterior myotendinous junction. Teres minor intact. Muscles: Preserved bulk and signal intensity of the rotator cuff musculature without edema, atrophy, or fatty infiltration. Biceps long head: Long head  biceps tendon is intact but appears significantly attenuated. Mild tenosynovitis. Acromioclavicular Joint: Mild-moderate arthropathy of the AC joint. Small volume subacromial-subdeltoid bursal fluid. Glenohumeral Joint: 5 mm near full-thickness cartilage defect of the superomedial aspect of the humeral head (series 7, image 11). Chondral thinning and surface irregularity particularly involving the posteroinferior glenoid. No joint effusion. Labrum: Suspect posterior labral tear with tiny cyst propagating along the posterior aspect of the glenoid neck (series 6, images 17-20). Additional probable paralabral cyst along the inferior aspect of the glenoid neck. Bones: No acute fracture. No dislocation. Mild subcortical marrow edema at the greater tuberosity underlying the supraspinatus tendon footprint. No suspicious bone lesion. Other: None.  IMPRESSION: 1. Rotator cuff tendinosis with full-thickness retracted tear of the anterior aspect of the distal supraspinatus tendon. 2. Interstitial tear of the infraspinatus tendon. 3. Suspected tears of the posterior and inferior labrum with tiny adjacent paralabral cysts. 4. 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 Electronically Signed   By: Davina Poke D.O.   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 Needs labs, CT  Probable recurrent rectal  fugax SL NTG suggested      Relevant Orders   POC COVID-19 BinaxNow      Meds ordered this encounter  Medications   lamoTRIgine (LAMICTAL) 100 MG tablet    Sig: Take 1 tablet (100 mg total) by mouth daily.    Dispense:  30 tablet    Refill:  1      Follow-up: Return in about 1 week (around 04/24/2022) for a follow-up visit.  Walker Kehr, MD

## 2022-04-17 NOTE — Discharge Instructions (Signed)
Return to emergency department if syncope recurs make a follow-up appointment with cardiology for further cardiac monitoring and echocardiogram.  Today your cardiac workup was stable.  Your hemoglobin level was normal Blood sugar level was normal Electrolytes were normal Your urine demonstrated blood.  Please follow-up with your PCP regarding these findings.  Your CT of your chest abdomen and pelvis demonstrated no acute process.  No life-threatening masses or acute findings.  Concerns for left-sided inguinal hernia.  Follow-up with primary care regarding general surgery referral.

## 2022-04-17 NOTE — Assessment & Plan Note (Addendum)
Likely related to meds - has been on Lamictal since Nov 2023 for possible bipolar disorder; taking Zanaflex and a higher Lamictal dose x 1 week  Go back to Lamictal 100 mg/d and d/c Zanaflex COVID test (-) CBG, EKG - OK

## 2022-04-17 NOTE — Assessment & Plan Note (Addendum)
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

## 2022-04-17 NOTE — Assessment & Plan Note (Addendum)
Off work  Go to ER now. 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

## 2022-04-17 NOTE — Patient Instructions (Addendum)
[facility] Emergency room  Address: [place], [place], [place] [address] Hours: Open 24 hours Phone: [phone]   [facility] Address: [address] [place], [place] [address] Hours: Open 24 hours Phone: [phone]   Stop Zanaflex Take Lamictal 100 mg  No driving

## 2022-04-17 NOTE — ED Triage Notes (Signed)
Pt via pov from home after a syncopal episode yesterday. He reports that he felt really hot and then passed out. He states he is feeling the same way today. Pt also reports feeling dizzy today, as well as abdominal pain intermittently. Pt alert & oriented, nad noted.

## 2022-04-17 NOTE — ED Provider Notes (Signed)
[facility] EMERGENCY DEPT Provider Note   CSN: [id number] Arrival date & time: 04/17/22  1446     History {Add pertinent medical, surgical, social history, OB history to HPI:1} Chief Complaint  Patient presents with   Loss of Consciousness    Carlos Tapia is a 52 y.o. male.  Patient is a 52 year old male with past medical history of migraines and bipolar disorder presenting for complaints of syncope.  Patient states yesterday morning he had episode of severe abdominal pain starting in the rectum and groin that resulted in becoming hot and sweaty and passing out.  Patient states he was sitting at home when this occurred.  He states he was not lifting anything heavy or doing anything exerting energy.  Denies chest pain or shortness of breath at that time.  Follow-up with his primary care physician today who recommended that he come to the emergency department for blood work and further evaluation.  Reports continued dizziness this morning that he describes as lightheadedness.  Denies any neurovascular deficits including no sensation or motor changes.  Denies vertigo or room spinning sensation.  Denies bleeding, black or bloody stools.  Denies any dehydration including no nausea, vomiting, or diarrhea.  Headedness is not positional in nature.  Does admit to new nasal congestion that started this morning upon waking.  The history is provided by the patient. No language interpreter was used.  Loss of Consciousness Associated symptoms: dizziness   Associated symptoms: no chest pain, no fever, no palpitations, no seizures, no shortness of breath and no vomiting        Home Medications Prior to Admission medications   Medication Sig Start Date End Date Taking? Authorizing Provider  lamoTRIgine (LAMICTAL) 100 MG tablet Take 1 tablet (100 mg total) by mouth daily. 04/17/22   Plotnikov, Evie Lacks, MD  lamoTRIgine (LAMICTAL) 150 MG tablet Take 1 tablet (150 mg total) by mouth  daily. 04/04/22   Arfeen, Arlyce Harman, MD      Allergies    Patient has no known allergies.    Review of Systems   Review of Systems  Constitutional:  Negative for chills and fever.  HENT:  Positive for congestion. Negative for ear pain and sore throat.   Eyes:  Negative for pain and visual disturbance.  Respiratory:  Negative for cough and shortness of breath.   Cardiovascular:  Positive for syncope. Negative for chest pain and palpitations.  Gastrointestinal:  Negative for abdominal pain and vomiting.  Genitourinary:  Negative for dysuria and hematuria.  Musculoskeletal:  Negative for arthralgias and back pain.  Skin:  Negative for color change and rash.  Neurological:  Positive for dizziness, syncope and light-headedness. Negative for seizures.  All other systems reviewed and are negative.   Physical Exam Updated Vital Signs BP (!) 163/101 (BP Location: Right Arm)   Pulse 64   Temp 98.1 F (36.7 C) (Oral)   Resp 20   Ht 5\' 10"  (1.778 m)   Wt 104.8 kg   SpO2 98%   BMI 33.15 kg/m  Physical Exam Vitals and nursing note reviewed.  Constitutional:      General: He is not in acute distress.    Appearance: He is well-developed.  HENT:     Head: Normocephalic and atraumatic.  Eyes:     Conjunctiva/sclera: Conjunctivae normal.  Cardiovascular:     Rate and Rhythm: Normal rate and regular rhythm.     Heart sounds: No murmur heard. Pulmonary:     Effort: Pulmonary effort  is normal. No respiratory distress.     Breath sounds: Normal breath sounds.  Abdominal:     Palpations: Abdomen is soft.     Tenderness: There is no abdominal tenderness.  Musculoskeletal:        General: No swelling.     Cervical back: Neck supple.  Skin:    General: Skin is warm and dry.     Capillary Refill: Capillary refill takes less than 2 seconds.  Neurological:     Mental Status: He is alert.  Psychiatric:        Mood and Affect: Mood normal.     ED Results / Procedures / Treatments    Labs (all labs ordered are listed, but only abnormal results are displayed) Labs Reviewed  RESP PANEL BY RT-PCR (RSV, FLU A&B, COVID)  [id number]  CBC  URINALYSIS, ROUTINE W REFLEX MICROSCOPIC  COMPREHENSIVE METABOLIC PANEL  LIPASE, BLOOD  CBG MONITORING, ED  TROPONIN I (HIGH SENSITIVITY)    EKG None  Radiology No results found.  Procedures Procedures  {Document cardiac monitor, telemetry assessment procedure when appropriate:1}  Medications Ordered in ED Medications  sodium chloride 0.9 % bolus 1,000 mL (1,000 mLs Intravenous New Bag/Given 04/17/22 1553)    ED Course/ Medical Decision Making/ A&P   {   Click here for ABCD2, HEART and other calculatorsREFRESH Note before signing :1}                          Medical Decision Making Amount and/or Complexity of Data Reviewed Labs: ordered.   93:68 PM 52 year old male with past medical history of migraines and bipolar disorder presenting for complaints of syncope.   {Document critical care time when appropriate:1} {Document review of labs and clinical decision tools ie heart score, Chads2Vasc2 etc:1}  {Document your independent review of radiology images, and any outside records:1} {Document your discussion with family members, caretakers, and with consultants:1} {Document social determinants of health affecting pt's care:1} {Document your decision making why or why not admission, treatments were needed:1} Final Clinical Impression(s) / ED Diagnoses Final diagnoses:  None    Rx / DC Orders ED Discharge Orders     None

## 2022-04-19 ENCOUNTER — Telehealth: Payer: Self-pay | Admitting: Internal Medicine

## 2022-04-19 NOTE — Telephone Encounter (Signed)
Patient seen in ED for hernia and is in need of an referral for general surgery, please advise.

## 2022-04-19 NOTE — Telephone Encounter (Signed)
Patients wife called and said that patient had to go to the ER 04/17/22 after seeing Dr Alain Marion and they told him that he has a hernia and to call our office back to get him set up with a general surgeon to discuss a plan of what to do. Call back is [phone]

## 2022-04-19 NOTE — Telephone Encounter (Signed)
Sorry, there was no hernia seen at either exam, so I cannot refer this

## 2022-04-20 ENCOUNTER — Encounter: Payer: Self-pay | Admitting: Internal Medicine

## 2022-04-20 ENCOUNTER — Encounter: Payer: BC Managed Care – PPO | Admitting: Physical Therapy

## 2022-04-20 ENCOUNTER — Ambulatory Visit: Payer: BC Managed Care – PPO | Admitting: Internal Medicine

## 2022-04-20 VITALS — BP 120/78 | HR 68 | Temp 98.1°F | Ht 70.0 in | Wt 226.0 lb

## 2022-04-20 DIAGNOSIS — J0100 Acute maxillary sinusitis, unspecified: Secondary | ICD-10-CM

## 2022-04-20 DIAGNOSIS — I2510 Atherosclerotic heart disease of native coronary artery without angina pectoris: Secondary | ICD-10-CM

## 2022-04-20 DIAGNOSIS — K4090 Unilateral inguinal hernia, without obstruction or gangrene, not specified as recurrent: Secondary | ICD-10-CM | POA: Diagnosis not present

## 2022-04-20 DIAGNOSIS — R1030 Lower abdominal pain, unspecified: Secondary | ICD-10-CM | POA: Diagnosis not present

## 2022-04-20 DIAGNOSIS — R55 Syncope and collapse: Secondary | ICD-10-CM

## 2022-04-20 DIAGNOSIS — I2583 Coronary atherosclerosis due to lipid rich plaque: Secondary | ICD-10-CM

## 2022-04-20 MED ORDER — CEFDINIR 300 MG PO CAPS: 300.0000 mg | ORAL_CAPSULE | Freq: Two times a day (BID) | ORAL | 0 refills | Status: DC

## 2022-04-20 MED ORDER — ASPIRIN 81 MG PO TBEC: 81.0000 mg | DELAYED_RELEASE_TABLET | Freq: Every day | ORAL | 3 refills | Status: DC

## 2022-04-20 NOTE — Assessment & Plan Note (Addendum)
Small sensitive L inguinal hernia was found on exam standing Will ref to Surgery 

## 2022-04-20 NOTE — Assessment & Plan Note (Addendum)
Small sensitive L inguinal hernia was found on exam standing Will ref to Surgery

## 2022-04-20 NOTE — Progress Notes (Signed)
Subjective:  Patient ID: Carlos Tapia, male    DOB: 1970/09/07  Age: 52 y.o. MRN: [id number]  CC: Follow-up and Nasal Congestion (Since Monday has been tested for COVID and flu results negative.)   HPI Carlos Tapia presents for syncope 0 resolved off Lamictal Pt went to ER - CT chest/abd was nl L inguinal hernia was found on exam C/o sinusitis sx's 4-5 d, worse  Outpatient Medications Prior to Visit  Medication Sig Dispense Refill   lamoTRIgine (LAMICTAL) 150 MG tablet Take 1 tablet (150 mg total) by mouth daily. 30 tablet 1   lamoTRIgine (LAMICTAL) 100 MG tablet Take 1 tablet (100 mg total) by mouth daily. (Patient not taking: Reported on 04/20/2022) 30 tablet 1   No facility-administered medications prior to visit.    ROS: Review of Systems  Constitutional:  Negative for appetite change, fatigue and unexpected weight change.  HENT:  Positive for rhinorrhea, sinus pressure and sinus pain. Negative for congestion, nosebleeds, sneezing, sore throat and trouble swallowing.   Eyes:  Negative for itching and visual disturbance.  Respiratory:  Negative for cough.   Cardiovascular:  Negative for chest pain, palpitations and leg swelling.  Gastrointestinal:  Positive for abdominal pain. Negative for abdominal distention, blood in stool, diarrhea, nausea and vomiting.  Genitourinary:  Negative for frequency and hematuria.  Musculoskeletal:  Negative for back pain, gait problem, joint swelling and neck pain.  Skin:  Negative for rash.  Neurological:  Negative for dizziness, tremors, speech difficulty and weakness.  Psychiatric/Behavioral:  Negative for agitation, dysphoric mood and sleep disturbance. The patient is not nervous/anxious.     Objective:  BP 120/78 (BP Location: Left Arm, Patient Position: Sitting, Cuff Size: Large)   Pulse 68   Temp 98.1 F (36.7 C) (Oral)   Ht 5\' 10"  (1.778 m)   Wt 226 lb (102.5 kg)   SpO2 95%   BMI 32.43 kg/m   BP Readings from Last 3  Encounters:  04/20/22 120/78  04/17/22 139/84  04/17/22 138/82    Wt Readings from Last 3 Encounters:  04/20/22 226 lb (102.5 kg)  04/17/22 231 lb (104.8 kg)  04/17/22 231 lb (104.8 kg)    Physical Exam Constitutional:      General: He is not in acute distress.    Appearance: He is well-developed. He is obese.     Comments: NAD  Eyes:     Conjunctiva/sclera: Conjunctivae normal.     Pupils: Pupils are equal, round, and reactive to light.  Neck:     Thyroid: No thyromegaly.     Vascular: No JVD.  Cardiovascular:     Rate and Rhythm: Normal rate and regular rhythm.     Heart sounds: Normal heart sounds. No murmur heard.    No friction rub. No gallop.  Pulmonary:     Effort: Pulmonary effort is normal. No respiratory distress.     Breath sounds: Normal breath sounds. No wheezing or rales.  Chest:     Chest wall: No tenderness.  Abdominal:     General: Bowel sounds are normal. There is no distension.     Palpations: Abdomen is soft. There is no mass.     Tenderness: There is no abdominal tenderness. There is no guarding or rebound.     Hernia: A hernia is present.  Musculoskeletal:        General: No tenderness. Normal range of motion.     Cervical back: Normal range of motion.  Lymphadenopathy:  Cervical: No cervical adenopathy.  Skin:    General: Skin is warm and dry.     Findings: No rash.  Neurological:     Mental Status: He is alert and oriented to person, place, and time.     Cranial Nerves: No cranial nerve deficit.     Motor: No abnormal muscle tone.     Coordination: Coordination normal.     Gait: Gait normal.     Deep Tendon Reflexes: Reflexes are normal and symmetric.  Psychiatric:        Behavior: Behavior normal.        Thought Content: Thought content normal.        Judgment: Judgment normal.   Small sensitive L inguinal hernia was found on exam standing Swollen nasal mucosa  Lab Results  Component Value Date   WBC 7.2 04/17/2022   HGB 15.4  04/17/2022   HCT 46.8 04/17/2022   PLT 252 04/17/2022   GLUCOSE 84 04/17/2022   CHOL 142 10/03/2021   TRIG 71.0 10/03/2021   HDL 39.50 10/03/2021   LDLCALC 88 10/03/2021   ALT 19 04/17/2022   AST 17 04/17/2022   NA 139 04/17/2022   K 4.1 04/17/2022   CL 103 04/17/2022   CREATININE 0.79 04/17/2022   BUN 20 04/17/2022   CO2 29 04/17/2022   TSH 1.64 10/03/2021   PSA 0.22 10/03/2021   HGBA1C 5.4 10/03/2021    CT Angio Chest/Abd/Pel for Dissection W and/or Wo Contrast  Result Date: 04/17/2022 CLINICAL DATA:  Acute aortic syndrome. Syncopal episode yesterday. EXAM: CT ANGIOGRAPHY CHEST, ABDOMEN AND PELVIS TECHNIQUE: Non-contrast CT of the chest was initially obtained. Multidetector CT imaging through the chest, abdomen and pelvis was performed using the standard protocol during bolus administration of intravenous contrast. Multiplanar reconstructed images and MIPs were obtained and reviewed to evaluate the vascular anatomy. RADIATION DOSE REDUCTION: This exam was performed according to the departmental dose-optimization program which includes automated exposure control, adjustment of the mA and/or kV according to patient size and/or use of iterative reconstruction technique. CONTRAST:  143mL OMNIPAQUE IOHEXOL 350 MG/ML SOLN COMPARISON:  None Available. FINDINGS: CTA CHEST FINDINGS Cardiovascular: The heart is normal in size. No pericardial effusion. The aorta is normal in caliber. No dissection. The branch vessels are patent. Significant age advanced three-vessel coronary artery calcifications. The pulmonary arteries are unremarkable. Mediastinum/Nodes: No mediastinal or hilar mass or lymphadenopathy the. The esophagus is grossly normal. Lungs/Pleura: No acute pulmonary findings. No infiltrates or effusions. No pulmonary lesions. No pneumothorax. Musculoskeletal: No significant bony findings. Review of the MIP images confirms the above findings. CTA ABDOMEN AND PELVIS FINDINGS VASCULAR Aorta: Normal  Celiac: Normal SMA: Normal Renals: Normal IMA: Normal Inflow: Minimal vascular calcifications. No aneurysm or dissection. Veins: Normal Review of the MIP images confirms the above findings. NON-VASCULAR Hepatobiliary: No hepatic lesions or intrahepatic biliary dilatation. The gallbladder is unremarkable. No common bile duct dilatation. Pancreas: Normal Spleen: Normal Adrenals/Urinary Tract: Normal Stomach/Bowel: The stomach, duodenum, small and colon are grossly normal. Lymphatic: No abdominal or pelvic lymphadenopathy. No inguinal adenopathy. Reproductive: The prostate gland and seminal vesicles are. Other: No pelvic mass or adenopathy. No free pelvic fluid collections. No inguinal mass or adenopathy. No abdominal wall hernia or subcutaneous lesions. Musculoskeletal: No significant bony findings. Review of the MIP images confirms the above findings. IMPRESSION: 1. Normal thoracic and abdominal aorta. No aneurysm or dissection. 2. Significant age advanced three-vessel coronary artery calcifications. 3. No acute pulmonary findings. 4. No significant abdominal/pelvic findings. Aortic  Atherosclerosis (ICD10-I70.0). Electronically Signed   By: Rudie Meyer M.D.   On: 04/17/2022 17:50    Assessment & Plan:   Problem List Items Addressed This Visit       Cardiovascular and Mediastinum   Coronary atherosclerosis    CAD on CT. Appt w/Cardiology is pending On [facility]      Relevant Medications   aspirin EC 81 MG tablet     Respiratory   Acute sinusitis    New Start Cefdinir po x 10 d      Relevant Medications   cefdinir (OMNICEF) 300 MG capsule     Other   Syncope and collapse    Resolved off Lamictal and muscle relaxer Ok to re-start Lamictal 100 mg qd To work on Mon 04/24/22      Non-recurrent unilateral inguinal hernia without obstruction or gangrene - Primary    Small sensitive L inguinal hernia was found on exam standing Will ref to Surgery      Relevant Orders   Ambulatory referral to  General Surgery   Abdominal pain    Small sensitive L inguinal hernia was found on exam standing Will ref to Surgery         Meds ordered this encounter  Medications   aspirin EC 81 MG tablet    Sig: Take 1 tablet (81 mg total) by mouth daily.    Dispense:  100 tablet    Refill:  3   cefdinir (OMNICEF) 300 MG capsule    Sig: Take 1 capsule (300 mg total) by mouth 2 (two) times daily.    Dispense:  20 capsule    Refill:  0      Follow-up: Return in about 4 weeks (around 05/18/2022) for a follow-up visit.  Sonda Primes, MD

## 2022-04-20 NOTE — Assessment & Plan Note (Signed)
New Start Cefdinir po x 10 d

## 2022-04-20 NOTE — Telephone Encounter (Signed)
PT and spouse calls today in regards to this issue. I informed them that Dr.John/Dr.Plotnikov would not be comfortable doing a referral for this until having another visit. Not sure what findings ER had to show that PT had a hernia but they did suggest them to refer back to [place] to get this checked out. They also had suggested they follow up with their cardiologist.  PT will be coming in later for a follow up visit with Dr.Plotnikov.

## 2022-04-20 NOTE — Assessment & Plan Note (Signed)
CAD on CT. Appt w/Cardiology is pending On [facility]SA

## 2022-04-20 NOTE — Assessment & Plan Note (Addendum)
Resolved off Lamictal and muscle relaxer Ok to re-start Lamictal 100 mg qd To work on [facility] 04/24/22

## 2022-04-24 ENCOUNTER — Encounter: Payer: BC Managed Care – PPO | Admitting: Physical Therapy

## 2022-04-25 ENCOUNTER — Emergency Department (HOSPITAL_COMMUNITY)
Admission: EM | Admit: 2022-04-25 | Discharge: 2022-04-25 | Discharge status: Against Medical Advice | Payer: BC Managed Care – PPO | Attending: Emergency Medicine | Admitting: Emergency Medicine

## 2022-04-25 ENCOUNTER — Emergency Department (HOSPITAL_COMMUNITY): Payer: BC Managed Care – PPO

## 2022-04-25 ENCOUNTER — Other Ambulatory Visit: Payer: Self-pay

## 2022-04-25 DIAGNOSIS — R1030 Lower abdominal pain, unspecified: Secondary | ICD-10-CM | POA: Insufficient documentation

## 2022-04-25 DIAGNOSIS — R109 Unspecified abdominal pain: Secondary | ICD-10-CM | POA: Diagnosis not present

## 2022-04-25 DIAGNOSIS — R42 Dizziness and giddiness: Secondary | ICD-10-CM | POA: Insufficient documentation

## 2022-04-25 DIAGNOSIS — R55 Syncope and collapse: Secondary | ICD-10-CM | POA: Diagnosis not present

## 2022-04-25 DIAGNOSIS — I10 Essential (primary) hypertension: Secondary | ICD-10-CM | POA: Diagnosis not present

## 2022-04-25 DIAGNOSIS — Z5321 Procedure and treatment not carried out due to patient leaving prior to being seen by health care provider: Secondary | ICD-10-CM | POA: Insufficient documentation

## 2022-04-25 LAB — CBC WITH DIFFERENTIAL/PLATELET
Abs Immature Granulocytes: 0.04 10*3/uL (ref 0.00–0.07)
Basophils Absolute: 0.1 10*3/uL (ref 0.0–0.1)
Basophils Relative: 1 %
Eosinophils Absolute: 0.2 10*3/uL (ref 0.0–0.5)
Eosinophils Relative: 2 %
HCT: 48.5 % (ref 39.0–52.0)
Hemoglobin: 16.2 g/dL (ref 13.0–17.0)
Immature Granulocytes: 0 %
Lymphocytes Relative: 22 %
Lymphs Abs: 2.4 10*3/uL (ref 0.7–4.0)
MCH: 30.9 pg (ref 26.0–34.0)
MCHC: 33.4 g/dL (ref 30.0–36.0)
MCV: 92.6 fL (ref 80.0–100.0)
Monocytes Absolute: 0.7 10*3/uL (ref 0.1–1.0)
Monocytes Relative: 6 %
Neutro Abs: 7.7 10*3/uL (ref 1.7–7.7)
Neutrophils Relative %: 69 %
Platelets: 237 10*3/uL (ref 150–400)
RBC: 5.24 MIL/uL (ref 4.22–5.81)
RDW: 12.7 % (ref 11.5–15.5)
WBC: 11.1 10*3/uL — ABNORMAL HIGH (ref 4.0–10.5)
nRBC: 0 % (ref 0.0–0.2)

## 2022-04-25 LAB — COMPREHENSIVE METABOLIC PANEL
ALT: 24 U/L (ref 0–44)
AST: 24 U/L (ref 15–41)
Albumin: 4.6 g/dL (ref 3.5–5.0)
Alkaline Phosphatase: 72 U/L (ref 38–126)
Anion gap: 13 (ref 5–15)
BUN: 18 mg/dL (ref 6–20)
CO2: 23 mmol/L (ref 22–32)
Calcium: 9.3 mg/dL (ref 8.9–10.3)
Chloride: 99 mmol/L (ref 98–111)
Creatinine, Ser: 0.78 mg/dL (ref 0.61–1.24)
GFR, Estimated: >60 mL/min (ref >60)
Glucose, Bld: 111 mg/dL — ABNORMAL HIGH (ref 70–99)
Potassium: 3.6 mmol/L (ref 3.5–5.1)
Sodium: 135 mmol/L (ref 135–145)
Total Bilirubin: 0.8 mg/dL (ref 0.3–1.2)
Total Protein: 7.7 g/dL (ref 6.5–8.1)

## 2022-04-25 LAB — LIPASE, BLOOD: Lipase: 41 U/L (ref 11–51)

## 2022-04-25 MED ORDER — IOHEXOL 300 MG/ML  SOLN
100.0000 mL | Freq: Once | INTRAMUSCULAR | Status: AC | PRN
  Administered 2022-04-25: 100 mL via INTRAVENOUS

## 2022-04-25 NOTE — Telephone Encounter (Signed)
Patients wife called back and said that patient had passed out again and she wanted to see what Dr Alain Marion recommended and while she was on the phone he passed out again and she said that her kids told her his face was really red. She told them to go ahead and call 911. She said he swill be going to either [facility] or [facility] and she will update [place] later

## 2022-04-25 NOTE — ED Triage Notes (Signed)
Pt reports left groin pain with surgery scheduled for Wednesday for Left inguinal hernia.

## 2022-04-25 NOTE — Telephone Encounter (Signed)
Patients wife called back to update that they are taking her husband to [facility].

## 2022-04-25 NOTE — ED Provider Triage Note (Signed)
Emergency Medicine Provider Triage Evaluation Note  Carlos Tapia , a 52 y.o. male  was evaluated in triage.  Pt complains of lower quadrant pain and frequent dizzy episodes. Seen on 15th for syncope had extensive workup, Found to have a left inguinal hernia on exam.  Thought that the pain from this is causing his dizzy episodes.  States that this has been happening persistently and he cannot get into the surgeon quickly so PCP advised to come to the ED Review of Systems  Positive: dizziness Negative: Chest pain, syncope  Physical Exam  BP (!) 181/97 (BP Location: Left Arm)   Pulse (!) 59   Temp 97.8 F (36.6 C) (Oral)   Resp 18   SpO2 99%  Gen:   Awake, no distress   Resp:  Normal effort  MSK:   Moves extremities without difficulty  Other:  No protruding hernia to the left inguinal area palpated on exam at this time  Medical Decision Making  Medically screening exam initiated at 6:10 PM.  Appropriate orders placed.  Carlos Tapia was informed that the remainder of the evaluation will be completed by another provider, this initial triage assessment does not replace that evaluation, and the importance of remaining in the ED until their evaluation is complete.     Gwenevere Abbot, [place] 04/25/22 1820

## 2022-04-26 ENCOUNTER — Telehealth: Payer: Self-pay

## 2022-04-26 ENCOUNTER — Ambulatory Visit: Payer: BC Managed Care – PPO | Attending: Internal Medicine | Admitting: Cardiovascular Disease

## 2022-04-26 ENCOUNTER — Encounter: Payer: Self-pay | Admitting: Cardiovascular Disease

## 2022-04-26 VITALS — BP 130/80 | HR 84 | Ht 70.0 in | Wt 224.2 lb

## 2022-04-26 DIAGNOSIS — I2510 Atherosclerotic heart disease of native coronary artery without angina pectoris: Secondary | ICD-10-CM | POA: Diagnosis not present

## 2022-04-26 DIAGNOSIS — I2584 Coronary atherosclerosis due to calcified coronary lesion: Secondary | ICD-10-CM | POA: Diagnosis not present

## 2022-04-26 DIAGNOSIS — R55 Syncope and collapse: Secondary | ICD-10-CM | POA: Diagnosis not present

## 2022-04-26 NOTE — Telephone Encounter (Signed)
Transition Care Management Follow-up Telephone Call Date of discharge and from where: [facility] 04-25-22 Dx: groin pain  How have you been since you were released from the hospital? Dizzy, hot flashes, SHOB with dyspnea  Any questions or concerns? No  Items Reviewed: Did the pt receive and understand the discharge instructions provided? Yes  Medications obtained and verified? Yes  Other? No  Any new allergies since your discharge? No  Dietary orders reviewed? Yes Do you have support at home? Yes   Home Care and Equipment/Supplies: Were home health services ordered? no If so, what is the name of the agency? na  Has the agency set up a time to come to the patient's home? not applicable Were any new equipment or medical supplies ordered?  No What is the name of the medical supply agency? na Were you able to get the supplies/equipment? not applicable Do you have any questions related to the use of the equipment or supplies? No  Functional Questionnaire: (I = Independent and D = Dependent) ADLs: I  Bathing/Dressing- I  Meal Prep- I  Eating- I  Maintaining continence- I  Transferring/Ambulation- I  Managing Meds- I  Follow up appointments reviewed:  PCP Hospital f/u appt confirmed? No  . [facility] f/u appt confirmed? Yes  Scheduled to see Dr Acie Fredrickson on 04-26-22 @ 240pm. Are transportation arrangements needed? No  If their condition worsens, is the pt aware to call PCP or go to the Emergency Dept.? Yes Was the patient provided with contact information for the PCP's office or ED? Yes Was to pt encouraged to call back with questions or concerns? Yes   Juanda Crumble LPN [place] Direct Dial [phone]

## 2022-04-26 NOTE — Telephone Encounter (Signed)
I agree with cardiology consult.  The patient saw Dr. Katharina Caper. I agree with surgical consultation. Follow-up with Dr. Jenny Reichmann. Thanks

## 2022-04-26 NOTE — Patient Instructions (Signed)
Medication Instructions:  Your physician recommends that you continue on your current medications as directed. Please refer to the Current Medication list given to you today.  *If you need a refill on your cardiac medications before your next appointment, please call your pharmacy*  Lab Work: Lipoprotein(a) today If you have labs (blood work) drawn today and your tests are completely normal, you will receive your results only by: [place] (if you have MyChart) OR A paper copy in the mail If you have any lab test that is abnormal or we need to change your treatment, we will call you to review the results.  Testing/Procedures: Coronary Calcium score CT Your physician has requested that you have cardiac CT. Cardiac computed tomography (CT) is a painless test that uses an x-ray machine to take clear, detailed pictures of your heart. For further information please visit HugeFiesta.tn. Please follow instruction sheet as given.  Follow-Up: At [facility], you and your health needs are our priority.  As part of our continuing mission to provide you with exceptional heart care, we have created designated Provider Care Teams.  These Care Teams include your primary Cardiologist (physician) and Advanced Practice Providers (APPs -  Physician Assistants and Nurse Practitioners) who all work together to provide you with the care you need, when you need it.  Your next appointment:   3 month(s)  Provider:   Mertie Moores, MD

## 2022-04-26 NOTE — Progress Notes (Addendum)
Cardiology Office Note:    Date:  04/26/2022   ID:  Carlos Tapia, DOB 1970/12/31, MRN [id number]  PCP:  Corwin Levins, MD   [facility] Providers Cardiologist:  Angelyna Henderson     Referring MD: Corwin Levins, MD   Chief Complaint  Patient presents with   Loss of Consciousness   coronary artery calcification     History of Present Illness:    Carlos Tapia is a 52 y.o. male with a hx of migraine headaches, bipolar disorder.  He was seen in the [facility] emergency department after having an episode of syncope.  Patient complained of severe abdominal pain starting in his rectum and groin.  He then became very hot and sweaty and passed out.  He was sitting at home when this occurred.  Denies any blood in stool or hematochezia.  His electrolytes were within normal limits.  Troponin levels were 3, 3 and were normal.  Hemoglobin is normal.  White blood cell count is normal.  Urinalysis was unremarkable. CT of the pelvis was negative for dissection of his aorta or iliac vessels. CT of the chest did reveal coronary artery calcifications.  He has frequent episodes of this pelvic pain.  Most of these/many of these are associated with a sensation of being hot and lightheaded.  He has passed out 3 separate times.  Every one of his episodes of syncope has been preceded by this pelvic/groin pain.  Is active   Lost 150 lbs over the past several years  On his own.  Has been exercising vigorously  ,  no CP , no syncope with his exercise ( cardio and weights )  Works in a [employer] which is very active  Quit smoking in 2018  Lipids look ok He is adopted - no idea about his family history      Past Medical History:  Diagnosis Date   Blood in stool    Chicken pox    GERD (gastroesophageal reflux disease)    Migraines    Shingles 2009, 2010    Past Surgical History:  Procedure Laterality Date   SHOULDER ARTHROSCOPY W/ ROTATOR CUFF REPAIR Right     Current  Medications: Current Meds  Medication Sig   cefdinir (OMNICEF) 300 MG capsule Take 1 capsule (300 mg total) by mouth 2 (two) times daily.   lamoTRIgine (LAMICTAL) 100 MG tablet Take 1 tablet (100 mg total) by mouth daily.     Allergies:   Patient has no known allergies.   Social History   Socioeconomic History   Marital status: Married    Spouse name: Not on file   Number of children: 2   Years of education: 12   Highest education level: Not on file  Occupational History   Occupation: Location manager  Tobacco Use   Smoking status: Former    Packs/day: 0.75    Years: 30.00    Total pack years: 22.50    Types: Cigarettes    Quit date: 03/30/2016    Years since quitting: 6.0   Smokeless tobacco: Never  Vaping Use   Vaping Use: Never used  Substance and Sexual Activity   Alcohol use: Yes    Comment: rarely   Drug use: Yes    Frequency: 7.0 times per week    Types: Marijuana   Sexual activity: Never    Birth control/protection: None  Other Topics Concern   Not on file  Social History Narrative   Fun: Video games  Denies religious beliefs effecting health care.    Social Determinants of Health   Financial Resource Strain: Not on file  Food Insecurity: Not on file  Transportation Needs: Not on file  Physical Activity: Not on file  Stress: Not on file  Social Connections: Not on file     Family History: The patient's family history includes Heart disease in his mother. There is no history of Colon cancer. He was adopted.  ROS:   Please see the history of present illness.     All other systems reviewed and are negative.  EKGs/Labs/Other Studies Reviewed:    The following studies were reviewed today:   EKG: April 25, 2022: Sinus rhythm at 58.  Prolonged PR interval.  Nonspecific IVCD.  Recent Labs: 10/03/2021: TSH 1.64 04/25/2022: ALT 24; BUN 18; Creatinine, Ser 0.78; Hemoglobin 16.2; Platelets 237; Potassium 3.6; Sodium 135  Recent Lipid Panel     Component Value Date/Time   CHOL 142 10/03/2021 1405   TRIG 71.0 10/03/2021 1405   HDL 39.50 10/03/2021 1405   CHOLHDL 4 10/03/2021 1405   VLDL 14.2 10/03/2021 1405   LDLCALC 88 10/03/2021 1405     Risk Assessment/Calculations:                Physical Exam:    VS:  BP 130/80   Pulse 84   Ht 5\' 10"  (1.778 m)   Wt 224 lb 3.2 oz (101.7 kg)   SpO2 96%   BMI 32.17 kg/m     Wt Readings from Last 3 Encounters:  04/26/22 224 lb 3.2 oz (101.7 kg)  04/20/22 226 lb (102.5 kg)  04/17/22 231 lb (104.8 kg)     GEN:  Well nourished, well developed in no acute distress HEENT: Normal NECK: No JVD; No carotid bruits LYMPHATICS: No lymphadenopathy CARDIAC: RRR, no murmurs, rubs, gallops RESPIRATORY:  Clear to auscultation without rales, wheezing or rhonchi  ABDOMEN: Soft, non-tender, non-distended MUSCULOSKELETAL:  No edema; No deformity  SKIN: Warm and dry NEUROLOGIC:  Alert and oriented x 3 PSYCHIATRIC:  Normal affect   ASSESSMENT:    1. Coronary artery calcification   2. Syncope, unspecified syncope type    PLAN:    In order of problems listed above:  Syncope: Patient presents for further evaluation of several episodes of syncope.  He has had 3 separate episodes of syncope and several episodes where he became hot and flushed and had near syncope.  Each 1 of these episodes was associated with left groin/rectal pain.  This intense pain caused intense vagal stimulation and probably caused him to become bradycardic and hypotensive.  Over the past several years he is lost 150 pounds through working out vigorously and improving his diet.  Because he has been able to work out so vigorously without any episodes of chest pain or syncope I do not think that he has any significant structural cardiac abnormalities or severe obstructive coronary artery disease at this point.  We discussed possibly getting an echocardiogram and/or monitor but at this point I do not think that that would  be productive.  I think our best solution is to find what is causing his intense groin/rectal pain.  He has an appointment to see the surgeons soon.   2.  Coronary artery calcifications: CT scan while at [facility] revealed coronary artery calcifications.  Will get a formal coronary calcium score test on a risk factor to him.  His lipid levels are mildly elevated with an LDL of 88.  Given  his coronary artery calcifications I would like to see his LDL around 55.  Will get the coronary CT scan and then decide on further therapy.   3.  Pre op evaluation prior to hernia surgery  His episode of syncope was preceded by intense groin pain / rectal pain .   This syncope was due to the excessive vagal stimulation from his visceral pain .  His is very active - works out vigorously  without chest pain or syncope  He is at low risk for his upcoming hernia repair    I will see him again in 3 months for follow-up office visit.          Medication Adjustments/Labs and Tests Ordered: Current medicines are reviewed at length with the patient today.  Concerns regarding medicines are outlined above.  Orders Placed This Encounter  Procedures   CT CARDIAC SCORING (SELF PAY ONLY)   Lipoprotein A (LPA)   No orders of the defined types were placed in this encounter.    Patient Instructions  Medication Instructions:  Your physician recommends that you continue on your current medications as directed. Please refer to the Current Medication list given to you today.  *If you need a refill on your cardiac medications before your next appointment, please call your pharmacy*  Lab Work: Lipoprotein(a) today If you have labs (blood work) drawn today and your tests are completely normal, you will receive your results only by: [place] (if you have MyChart) OR A paper copy in the mail If you have any lab test that is abnormal or we need to change your treatment, we will call you to review the  results.  Testing/Procedures: Coronary Calcium score CT Your physician has requested that you have cardiac CT. Cardiac computed tomography (CT) is a painless test that uses an x-ray machine to take clear, detailed pictures of your heart. For further information please visit HugeFiesta.tn. Please follow instruction sheet as given.  Follow-Up: At [facility], you and your health needs are our priority.  As part of our continuing mission to provide you with exceptional heart care, we have created designated Provider Care Teams.  These Care Teams include your primary Cardiologist (physician) and Advanced Practice Providers (APPs -  Physician Assistants and Nurse Practitioners) who all work together to provide you with the care you need, when you need it.  Your next appointment:   3 month(s)  Provider:   Mertie Moores, MD     Signed, Mertie Moores, MD  04/26/2022 5:50 PM    [place]

## 2022-04-27 ENCOUNTER — Ambulatory Visit: Payer: Self-pay | Admitting: Surgery

## 2022-04-27 ENCOUNTER — Encounter: Payer: BC Managed Care – PPO | Admitting: Physical Therapy

## 2022-04-27 DIAGNOSIS — K4020 Bilateral inguinal hernia, without obstruction or gangrene, not specified as recurrent: Secondary | ICD-10-CM | POA: Diagnosis not present

## 2022-04-27 DIAGNOSIS — R55 Syncope and collapse: Secondary | ICD-10-CM | POA: Diagnosis not present

## 2022-04-27 DIAGNOSIS — K6289 Other specified diseases of anus and rectum: Secondary | ICD-10-CM | POA: Diagnosis not present

## 2022-04-27 LAB — LIPOPROTEIN A (LPA): Lipoprotein (a): <8.4 nmol/L (ref <75.0)

## 2022-04-27 NOTE — H&P (Signed)
Carlos Tapia [id number]   Referring Provider:  Johnson, Megan P, DO   Subjective   Chief Complaint: New Consultation (Non-recurrent unilateral inguinal hernia w/o obstruction or gangrene)     History of Present Illness:    51-year-old male with history of GERD, migraines, bipolar disorder, shingles, obesity, and blood in stool who presents for evaluation of an inguinal hernia.  He has been having left groin pain for 3 to 4 years, which initially was attributed to likely back pain secondary to morbid obesity and high intensity physical labor (patient states he had extensive workup including MRI, etc.) but for the last several months also has been having severe rectal pain episodes which lead to shortness of breath with dyspnea, dizziness, and syncopal events.   He actually presented to the emergency room on the 15th for a workup for syncope.  Per the ER provider's notes, he had an episode of severe abdominal pain starting in the rectum and groin that resulted in becoming hot, sweaty and passing out.  This occurred while he was sitting at home.  He was not lifting anything or doing anything exertional.  He followed up with his primary care doctor the next day who recommended he go to the emergency department for blood work and further evaluation.  He had continued dizziness and lightheadedness but no specific neurovascular deficits or sensorimotor changes.  Denied any melena or hematochezia, denies any dehydration, no nausea, vomiting or diarrhea.  His exam there was quite reassuring, he was documented to have a normal abdominal exam and a normal rectal exam with no mass, fissure or external hemorrhoid, no perineal tenderness but did note some tenderness along the left inguinal canal but no palpable hernia.  He was worked up further with a CT dissection, EKG, troponins, electrolytes and chest x-ray all of which were reassuring.  He was discharged with plan for follow-up with PCP and cardiology.  No  hernia was noted on physical exam nor on his CT angio at that time.  Following that visit, the patient and his wife sought further follow-up with their PCP requesting referral for a left groin hernia.  He had total of 3 or 4 episodes of syncope, always associated with this rectal pain.  This occurred again on 1/23 and he re-presented via EMS to the  ER.  Had another CT scan and further lab work done as summarized below, at this time showing a tiny umbilical hernia and bilateral fat-containing small inguinal hernias.  He had an appointment with cardiologist (Dr. Nahser) on 1/24 for further assessment given findings of the coronary artery calcifications plus syncopal episodes.  Per Dr. Nasser's note and by patient's report, patient is very physically active at work as well as exercise and has lost 150 pounds on his own through vigorous workout and diet improvement over the last couple years-no anginal symptoms-he has low concern for any cardiac etiology of this and noted that each of his syncopal episodes was prompted by severe rectal/pelvic pain and left groin pain.  Suspects that this is vasovagal events related to pain.  Patient does have a coronary CT scheduled for 2/15 and lipoprotein a level pending..  No previous abdominal surgery.  Former smoker, quit in 2017; per [facility] epic reports regular marijuana use  I reviewed his labs from the ER on 1/23-CMP unremarkable, CBC notable for WBC of 11.1 but otherwise unremarkable, Of note had labs also in the ER on the 15th with a white count of 7.2 and a urinalysis   notable for moderate positive hemoglobin specific gravity of 1.02  I reviewed the reports and images from his CT angio from 1/15 and his CT abdomen pelvis from 1/23.  Significant age advanced three-vessel coronary artery calcifications but no other abnormality noted on the former, small fat containing umbilical and bilateral inguinal hernia noted on the latter. These are very small on my  review.     Review of Systems: A complete review of systems was obtained from the patient.  I have reviewed this information and discussed as appropriate with the patient.  See HPI as well for other ROS.   Medical History: Past Medical History:  Diagnosis Date   Anxiety    Asthma, unspecified asthma severity, unspecified whether complicated, unspecified whether persistent    GERD (gastroesophageal reflux disease)    Hyperlipidemia    Sleep apnea     There is no problem list on file for this patient.   Past Surgical History:  Procedure Laterality Date   Shoulder arthroscopy w/ rotator cuff repair (Right)       No Known Allergies  Current Outpatient Medications on File Prior to Visit  Medication Sig Dispense Refill   lamoTRIgine (LAMICTAL) 100 MG tablet Take 100 mg by mouth once daily     No current facility-administered medications on file prior to visit.    Family History  Problem Relation Age of Onset   Coronary Artery Disease (Blocked arteries around heart) Mother      Social History   Tobacco Use  Smoking Status Former   Types: Cigarettes   Quit date: 04/03/2016   Years since quitting: 6.0  Smokeless Tobacco Never     Social History   Socioeconomic History   Marital status: Married  Tobacco Use   Smoking status: Former    Types: Cigarettes    Quit date: 04/03/2016    Years since quitting: 6.0   Smokeless tobacco: Never  Vaping Use   Vaping Use: Never used  Substance and Sexual Activity   Alcohol use: Not Currently   Drug use: Yes    Comment: Marijuana    Objective:    Vitals:   04/27/22 0910  BP: 134/84  Pulse: 76  Temp: 36.8 C (98.3 F)  SpO2: 99%  Weight: (!) 101.8 kg (224 lb 6.4 oz)  Height: 177.8 cm (5\' 10" )  PainSc: 0-No pain    Body mass index is 32.2 kg/m.  Gen: A&Ox3, no distress  Unlabored respirations Abdomen is soft, nontender, nondistended.  There is no palpable hernia on the right side however on the left side there is  a palpable left inguinal hernia with this tablet.  Umbilical hernia noted on CT is not palpable.  Assessment and Plan:  Diagnoses and all orders for this visit:  Non-recurrent bilateral inguinal hernia without obstruction or gangrene  Syncope, unspecified syncope type  Rectal pain  He does have a palpable left inguinal hernia and chronic left groin pain.  I do believe that this merits repair.  Given possible hernia on the right side noted on CT, I recommend a minimally invasive repair approach.  I discussed the surgery with him in detail including relevant anatomy, technical components, and risks of bleeding, infection, pain, scarring, injury to intra-abdominal or retroperitoneal structures such as bowel, bladder, blood vessels, nerves, etc. hematoma/seroma, urinary retention, poor wound healing, hernia recurrence, chronic groin pain/failure to resolve preoperative groin pain, as well as general cardiovascular/pulmonary/thromboembolic risks of undergoing general anesthesia.  Discussed that we will assess the right side  but if no obvious hernia noted intra-abdominal he we will only fix the left side.  His umbilical hernia is not palpable or symptomatic and is quite a tiny fascial defect on his CT, I actually recommend leaving this alone at this point as well.   I discussed with him and his wife that I suspect the intermittent rectal pain is a separate issue and I would not necessarily anticipate this to improve with repairing his left inguinal hernia.  He had a colonoscopy a few years ago which by his report was normal.  He has not had any melena/hematochezia or change in stool caliber and he notes that his bowel movements are fairly regular without pushing or straining.  Wonder if this is a issue of smooth muscle spasm.  If he continues to have these episodes he will need further follow-up with gastroenterology for further evaluation.   Heaton Sarin Raquel James, MD

## 2022-04-27 NOTE — H&P (View-Only) (Signed)
Gauge Winski [id number]   Referring Provider:  Dorcas Carrow, DO   Subjective   Chief Complaint: New Consultation (Non-recurrent unilateral inguinal hernia w/o obstruction or gangrene)     History of Present Illness:    52 year old male with history of GERD, migraines, bipolar disorder, shingles, obesity, and blood in stool who presents for evaluation of an inguinal hernia.  He has been having left groin pain for 3 to 4 years, which initially was attributed to likely back pain secondary to morbid obesity and high intensity physical labor (patient states he had extensive workup including MRI, etc.) but for the last several months also has been having severe rectal pain episodes which lead to shortness of breath with dyspnea, dizziness, and syncopal events.   He actually presented to the emergency room on the 15th for a workup for syncope.  Per the ER provider's notes, he had an episode of severe abdominal pain starting in the rectum and groin that resulted in becoming hot, sweaty and passing out.  This occurred while he was sitting at home.  He was not lifting anything or doing anything exertional.  He followed up with his primary care doctor the next day who recommended he go to the emergency department for blood work and further evaluation.  He had continued dizziness and lightheadedness but no specific neurovascular deficits or sensorimotor changes.  Denied any melena or hematochezia, denies any dehydration, no nausea, vomiting or diarrhea.  His exam there was quite reassuring, he was documented to have a normal abdominal exam and a normal rectal exam with no mass, fissure or external hemorrhoid, no perineal tenderness but did note some tenderness along the left inguinal canal but no palpable hernia.  He was worked up further with a CT dissection, EKG, troponins, electrolytes and chest x-ray all of which were reassuring.  He was discharged with plan for follow-up with PCP and cardiology.  No  hernia was noted on physical exam nor on his CT angio at that time.  Following that visit, the patient and his wife sought further follow-up with their PCP requesting referral for a left groin hernia.  He had total of 3 or 4 episodes of syncope, always associated with this rectal pain.  This occurred again on 1/23 and he re-presented via EMS to [facility].  Had another CT scan and further lab work done as summarized below, at this time showing a tiny umbilical hernia and bilateral fat-containing small inguinal hernias.  He had an appointment with cardiologist (Dr. Elease Hashimoto) on 1/24 for further assessment given findings of the coronary artery calcifications plus syncopal episodes.  Per Dr. Arletha Grippe note and by patient's report, patient is very physically active at work as well as exercise and has lost 150 pounds on his own through vigorous workout and diet improvement over the last couple years-no anginal symptoms-he has low concern for any cardiac etiology of this and noted that each of his syncopal episodes was prompted by severe rectal/pelvic pain and left groin pain.  Suspects that this is vasovagal events related to pain.  Patient does have a coronary CT scheduled for 2/15 and lipoprotein a level pending.Marland Kitchen  No previous abdominal surgery.  Former smoker, quit in 2017; per [facility] epic reports regular marijuana use  I reviewed his labs from the ER on 1/23-CMP unremarkable, CBC notable for WBC of 11.1 but otherwise unremarkable, Of note had labs also in the ER on the 15th with a white count of 7.2 and a urinalysis  notable for moderate positive hemoglobin specific gravity of 1.02  I reviewed the reports and images from his CT angio from 1/15 and his CT abdomen pelvis from 1/23.  Significant age advanced three-vessel coronary artery calcifications but no other abnormality noted on the former, small fat containing umbilical and bilateral inguinal hernia noted on the latter. These are very small on my  review.     Review of Systems: A complete review of systems was obtained from the patient.  I have reviewed this information and discussed as appropriate with the patient.  See HPI as well for other ROS.   Medical History: Past Medical History:  Diagnosis Date   Anxiety    Asthma, unspecified asthma severity, unspecified whether complicated, unspecified whether persistent    GERD (gastroesophageal reflux disease)    Hyperlipidemia    Sleep apnea     There is no problem list on file for this patient.   Past Surgical History:  Procedure Laterality Date   Shoulder arthroscopy w/ rotator cuff repair (Right)       No Known Allergies  Current Outpatient Medications on File Prior to Visit  Medication Sig Dispense Refill   lamoTRIgine (LAMICTAL) 100 MG tablet Take 100 mg by mouth once daily     No current facility-administered medications on file prior to visit.    Family History  Problem Relation Age of Onset   Coronary Artery Disease (Blocked arteries around heart) Mother      Social History   Tobacco Use  Smoking Status Former   Types: Cigarettes   Quit date: 04/03/2016   Years since quitting: 6.0  Smokeless Tobacco Never     Social History   Socioeconomic History   Marital status: Married  Tobacco Use   Smoking status: Former    Types: Cigarettes    Quit date: 04/03/2016    Years since quitting: 6.0   Smokeless tobacco: Never  Vaping Use   Vaping Use: Never used  Substance and Sexual Activity   Alcohol use: Not Currently   Drug use: Yes    Comment: Marijuana    Objective:    Vitals:   04/27/22 0910  BP: 134/84  Pulse: 76  Temp: 36.8 C (98.3 F)  SpO2: 99%  Weight: (!) 101.8 kg (224 lb 6.4 oz)  Height: 177.8 cm (5\' 10" )  PainSc: 0-No pain    Body mass index is 32.2 kg/m.  Gen: A&Ox3, no distress  Unlabored respirations Abdomen is soft, nontender, nondistended.  There is no palpable hernia on the right side however on the left side there is  a palpable left inguinal hernia with this tablet.  Umbilical hernia noted on CT is not palpable.  Assessment and Plan:  Diagnoses and all orders for this visit:  Non-recurrent bilateral inguinal hernia without obstruction or gangrene  Syncope, unspecified syncope type  Rectal pain  He does have a palpable left inguinal hernia and chronic left groin pain.  I do believe that this merits repair.  Given possible hernia on the right side noted on CT, I recommend a minimally invasive repair approach.  I discussed the surgery with him in detail including relevant anatomy, technical components, and risks of bleeding, infection, pain, scarring, injury to intra-abdominal or retroperitoneal structures such as bowel, bladder, blood vessels, nerves, etc. hematoma/seroma, urinary retention, poor wound healing, hernia recurrence, chronic groin pain/failure to resolve preoperative groin pain, as well as general cardiovascular/pulmonary/thromboembolic risks of undergoing general anesthesia.  Discussed that we will assess the right side  but if no obvious hernia noted intra-abdominal he we will only fix the left side.  His umbilical hernia is not palpable or symptomatic and is quite a tiny fascial defect on his CT, I actually recommend leaving this alone at this point as well.   I discussed with him and his wife that I suspect the intermittent rectal pain is a separate issue and I would not necessarily anticipate this to improve with repairing his left inguinal hernia.  He had a colonoscopy a few years ago which by his report was normal.  He has not had any melena/hematochezia or change in stool caliber and he notes that his bowel movements are fairly regular without pushing or straining.  Wonder if this is a issue of smooth muscle spasm.  If he continues to have these episodes he will need further follow-up with gastroenterology for further evaluation.   Raja Caputi Raquel James, MD

## 2022-05-01 ENCOUNTER — Ambulatory Visit: Payer: BC Managed Care – PPO | Admitting: Internal Medicine

## 2022-05-01 ENCOUNTER — Ambulatory Visit: Payer: BC Managed Care – PPO | Admitting: Family Medicine

## 2022-05-02 ENCOUNTER — Encounter: Payer: BC Managed Care – PPO | Admitting: Physical Therapy

## 2022-05-03 NOTE — Patient Instructions (Signed)
DUE TO COVID-19 ONLY TWO VISITORS  (aged 50 and older)  ARE ALLOWED TO COME WITH YOU AND STAY IN THE WAITING ROOM ONLY DURING PRE OP AND PROCEDURE.   **NO VISITORS ARE ALLOWED IN THE SHORT STAY AREA OR RECOVERY ROOM!!**  IF YOU WILL BE ADMITTED INTO THE HOSPITAL YOU ARE ALLOWED ONLY FOUR SUPPORT PEOPLE DURING VISITATION HOURS ONLY (7 AM -8PM)   The support person(s) must pass our screening, gel in and out, and wear a mask at all times, including in the patient's room. Patients must also wear a mask when staff or their support person are in the room. Visitors GUEST BADGE MUST BE WORN VISIBLY  One adult visitor may remain with you overnight and MUST be in the room by 8 P.M.     Your procedure is scheduled on: 05/08/22   Report to [facility] Main Entrance    Report to admitting at : 7:45 AM   Call this number if you have problems the morning of surgery [phone]   Do not eat food :After Midnight.   After Midnight you may have the following liquids until : 7:00 AM DAY OF SURGERY  Water Black Coffee (sugar ok, NO MILK/CREAM OR CREAMERS)  Tea (sugar ok, NO MILK/CREAM OR CREAMERS) regular and decaf                             Plain Jell-O (NO RED)                                           Fruit ices (not with fruit pulp, NO RED)                                     Popsicles (NO RED)                                                                  Juice: apple, WHITE grape, WHITE cranberry Sports drinks like Gatorade (NO RED)               Oral Hygiene is also important to reduce your risk of infection.                                    Remember - BRUSH YOUR TEETH THE MORNING OF SURGERY WITH YOUR REGULAR TOOTHPASTE  DENTURES WILL BE REMOVED PRIOR TO SURGERY PLEASE DO NOT APPLY "Poly grip" OR ADHESIVES!!!   Do NOT smoke after Midnight   Take these medicines the morning of surgery with A SIP OF WATER: lamotrigine.                              You may not have any metal on  your body including hair pins, jewelry, and body piercing             Do not wear lotions, powders, perfumes/cologne, or deodorant  Men may shave face and neck.   Do not bring valuables to the hospital. [place].   Contacts, glasses, or bridgework may not be worn into surgery.   Bring small overnight bag day of surgery.   DO NOT [place]. PHARMACY WILL DISPENSE MEDICATIONS LISTED ON YOUR MEDICATION LIST TO YOU DURING YOUR ADMISSION [place]!    Patients discharged on the day of surgery will not be allowed to drive home.  Someone NEEDS to stay with you for the first 24 hours after anesthesia.   Special Instructions: Bring a copy of your healthcare power of attorney and living will documents         the day of surgery if you haven't scanned them before.              Please read over the following fact sheets you were given: IF YOU HAVE QUESTIONS ABOUT YOUR PRE-OP INSTRUCTIONS PLEASE CALL [phone]    [facility] - Preparing for Surgery Before surgery, you can play an important role.  Because skin is not sterile, your skin needs to be as free of germs as possible.  You can reduce the number of germs on your skin by washing with CHG (chlorahexidine gluconate) soap before surgery.  CHG is an antiseptic cleaner which kills germs and bonds with the skin to continue killing germs even after washing. Please DO NOT use if you have an allergy to CHG or antibacterial soaps.  If your skin becomes reddened/irritated stop using the CHG and inform your nurse when you arrive at Short Stay. Do not shave (including legs and underarms) for at least 48 hours prior to the first CHG shower.  You may shave your face/neck. Please follow these instructions carefully:  1.  Shower with CHG Soap the night before surgery and the  morning of Surgery.  2.  If you choose to wash your hair, wash your hair first as  usual with your  normal  shampoo.  3.  After you shampoo, rinse your hair and body thoroughly to remove the  shampoo.                           4.  Use CHG as you would any other liquid soap.  You can apply chg directly  to the skin and wash                       Gently with a scrungie or clean washcloth.  5.  Apply the CHG Soap to your body ONLY FROM THE NECK DOWN.   Do not use on face/ open                           Wound or open sores. Avoid contact with eyes, ears mouth and genitals (private parts).                       Wash face,  Genitals (private parts) with your normal soap.             6.  Wash thoroughly, paying special attention to the area where your surgery  will be performed.  7.  Thoroughly rinse your body with warm water from the neck down.  8.  DO  NOT shower/wash with your normal soap after using and rinsing off  the CHG Soap.                9.  Pat yourself dry with a clean towel.            10.  Wear clean pajamas.            11.  Place clean sheets on your bed the night of your first shower and do not  sleep with pets. Day of Surgery : Do not apply any lotions/deodorants the morning of surgery.  Please wear clean clothes to the hospital/surgery center.  FAILURE TO FOLLOW THESE INSTRUCTIONS MAY RESULT IN THE CANCELLATION OF YOUR SURGERY PATIENT SIGNATURE_________________________________  NURSE SIGNATURE__________________________________  ________________________________________________________________________

## 2022-05-04 ENCOUNTER — Encounter (HOSPITAL_COMMUNITY): Payer: Self-pay

## 2022-05-04 ENCOUNTER — Encounter: Payer: BC Managed Care – PPO | Admitting: Physical Therapy

## 2022-05-04 ENCOUNTER — Other Ambulatory Visit: Payer: Self-pay

## 2022-05-04 ENCOUNTER — Encounter (HOSPITAL_COMMUNITY)
Admission: RE | Admit: 2022-05-04 | Discharge: 2022-05-04 | Disposition: A | Discharge status: Home / Self Care | Payer: BC Managed Care – PPO | Source: Ambulatory Visit | Attending: Surgery | Admitting: Surgery

## 2022-05-04 DIAGNOSIS — K4090 Unilateral inguinal hernia, without obstruction or gangrene, not specified as recurrent: Secondary | ICD-10-CM | POA: Insufficient documentation

## 2022-05-04 DIAGNOSIS — Z01812 Encounter for preprocedural laboratory examination: Secondary | ICD-10-CM | POA: Diagnosis not present

## 2022-05-04 DIAGNOSIS — I2510 Atherosclerotic heart disease of native coronary artery without angina pectoris: Secondary | ICD-10-CM | POA: Diagnosis not present

## 2022-05-04 DIAGNOSIS — Z87891 Personal history of nicotine dependence: Secondary | ICD-10-CM | POA: Insufficient documentation

## 2022-05-04 HISTORY — DX: Anxiety disorder, unspecified: F41.9

## 2022-05-04 HISTORY — DX: Unspecified osteoarthritis, unspecified site: M19.90

## 2022-05-04 HISTORY — DX: Depression, unspecified: F32.A

## 2022-05-04 HISTORY — DX: Atherosclerotic heart disease of native coronary artery without angina pectoris: I25.10

## 2022-05-04 NOTE — Progress Notes (Addendum)
For Short Stay: [place] appointment date:  Bowel Prep reminder:   For Anesthesia: PCP - Dr. Cathlean Cower Cardiologist - Dr. Mertie Moores. LOV: 04/26/22: Clearance  Chest x-ray -  EKG - 04/25/22 Stress Test -  ECHO -  Cardiac Cath -  Pacemaker/ICD device last checked: Pacemaker orders received: Device Rep notified:  Spinal Cord Stimulator:  Sleep Study -  CPAP -   Fasting Blood Sugar -  Checks Blood Sugar _____ times a day Date and result of last Hgb A1c-  Last dose of GLP1 agonist-  GLP1 instructions:   Last dose of SGLT-2 inhibitors-  SGLT-2 instructions:   Blood Thinner Instructions: Aspirin Instructions: Last Dose:  Activity level: Can go up a flight of stairs and activities of daily living without stopping and without chest pain and/or shortness of breath   Able to exercise without chest pain and/or shortness of breath   Anesthesia review: Hx: CAD,Syncope.  Patient denies shortness of breath, fever, cough and chest pain at PAT appointment   Patient verbalized understanding of instructions that were given to them at the PAT appointment. Patient was also instructed that they will need to review over the PAT instructions again at home before surgery.

## 2022-05-05 NOTE — Progress Notes (Signed)
Anesthesia Chart Review   Case: [id number] Date/Time: 05/08/22 0945   Procedure: ROBOTIC LEFT, POSSIBLE BILATERAL INGUINAL HERNIA REPAIR (Left) - 120 MINUTES   Anesthesia type: General   Pre-op diagnosis: INGUINAL HERNIA   Location: [facility] ROOM 02 / [facility]   Surgeons: Clovis Riley, MD       DISCUSSION:52 y.o. former smoker with h/o CAD, inguinal hernia scheduled for above procedure 05/08/2022 with Dr. Romana Juniper.   Pt seen by cardiology 04/26/2022. Per OV note, "His episode of syncope was preceded by intense groin pain / rectal pain .   This syncope was due to the excessive vagal stimulation from his visceral pain .  His is very active - works out vigorously  without chest pain or syncope  He is at low risk for his upcoming hernia repair "  Anticipate pt can proceed with planned procedure barring acute status change.   VS: BP (!) 149/86   Pulse 71   Temp 36.6 C (Oral)   Ht 5\' 10"  (1.778 m)   Wt 103.9 kg   SpO2 97%   BMI 32.86 kg/m   PROVIDERS: Biagio Borg, MD is PCP   Mertie Moores, MD is Cardiologist  LABS: Labs reviewed: Acceptable for surgery. (all labs ordered are listed, but only abnormal results are displayed)  Labs Reviewed - No data to display   IMAGES:   EKG:   CV:  Past Medical History:  Diagnosis Date   Anxiety    Arthritis    Blood in stool    Chicken pox    Coronary artery disease    Depression    GERD (gastroesophageal reflux disease)    Migraines    Shingles 2009, 2010    Past Surgical History:  Procedure Laterality Date   SHOULDER ARTHROSCOPY W/ ROTATOR CUFF REPAIR Right     MEDICATIONS:  aspirin EC 81 MG tablet   cefdinir (OMNICEF) 300 MG capsule   Cholecalciferol (VITAMIN D-3) 125 MCG (5000 UT) TABS   Creatine POWD   diphenhydramine-acetaminophen (TYLENOL PM) 25-500 MG TABS tablet   lamoTRIgine (LAMICTAL) 100 MG tablet   Multiple Vitamins-Minerals (MULTIVITAMIN WITH MINERALS) tablet   No current facility-administered  medications for this encounter.    Konrad Felix Ward, PA-C [facility] Pre-Surgical Testing [phone]

## 2022-05-07 ENCOUNTER — Encounter (HOSPITAL_COMMUNITY): Payer: Self-pay | Admitting: Surgery

## 2022-05-07 NOTE — Anesthesia Preprocedure Evaluation (Signed)
Anesthesia Evaluation  Patient identified by MRN, date of birth, ID band Patient awake    Reviewed: Allergy & Precautions, NPO status , Patient's Chart, lab work & pertinent test results  Airway Mallampati: I       Dental no notable dental hx. (+) Dental Advisory Given   Pulmonary asthma , sleep apnea and Continuous Positive Airway Pressure Ventilation , former smoker   Pulmonary exam normal breath sounds clear to auscultation       Cardiovascular + CAD  Normal cardiovascular exam Rhythm:Regular Rate:Normal     Neuro/Psych  Headaches PSYCHIATRIC DISORDERS Anxiety Depression     Neuromuscular disease    GI/Hepatic Neg liver ROS,GERD  Medicated,,  Endo/Other  Obesity HLD  Renal/GU negative Renal ROS  negative genitourinary   Musculoskeletal  (+) Arthritis , Osteoarthritis,  Left and possible right Inguinal hernias   Abdominal  (+) + obese  Peds  Hematology negative hematology ROS (+)   Anesthesia Other Findings   Reproductive/Obstetrics                             Anesthesia Physical Anesthesia Plan  ASA: 2  Anesthesia Plan: General   Post-op Pain Management: Minimal or no pain anticipated   Induction: Intravenous  PONV Risk Score and Plan: 3 and Treatment may vary due to age or medical condition, Scopolamine patch - Pre-op, Midazolam, Dexamethasone and Ondansetron  Airway Management Planned: Oral ETT  Additional Equipment: None  Intra-op Plan:   Post-operative Plan: Extubation in OR  Informed Consent: I have reviewed the patients History and Physical, chart, labs and discussed the procedure including the risks, benefits and alternatives for the proposed anesthesia with the patient or authorized representative who has indicated his/her understanding and acceptance.     Dental advisory given  Plan Discussed with: Anesthesiologist and CRNA  Anesthesia Plan Comments:          Anesthesia Quick Evaluation

## 2022-05-08 ENCOUNTER — Encounter (HOSPITAL_COMMUNITY)
Admission: RE | Disposition: A | Discharge status: Home / Self Care | Payer: Self-pay | Source: Ambulatory Visit | Attending: Surgery

## 2022-05-08 ENCOUNTER — Ambulatory Visit (HOSPITAL_COMMUNITY): Payer: BC Managed Care – PPO | Admitting: Anesthesiology

## 2022-05-08 ENCOUNTER — Ambulatory Visit (HOSPITAL_COMMUNITY)
Admission: RE | Admit: 2022-05-08 | Discharge: 2022-05-08 | Disposition: A | Discharge status: Home / Self Care | Payer: BC Managed Care – PPO | Source: Ambulatory Visit | Attending: Surgery | Admitting: Surgery

## 2022-05-08 ENCOUNTER — Ambulatory Visit (HOSPITAL_COMMUNITY): Payer: BC Managed Care – PPO | Admitting: Physician Assistant

## 2022-05-08 ENCOUNTER — Encounter (HOSPITAL_COMMUNITY): Payer: Self-pay | Admitting: Surgery

## 2022-05-08 ENCOUNTER — Encounter: Payer: BC Managed Care – PPO | Admitting: Physical Therapy

## 2022-05-08 ENCOUNTER — Other Ambulatory Visit: Payer: Self-pay

## 2022-05-08 DIAGNOSIS — E669 Obesity, unspecified: Secondary | ICD-10-CM | POA: Diagnosis not present

## 2022-05-08 DIAGNOSIS — G4730 Sleep apnea, unspecified: Secondary | ICD-10-CM | POA: Diagnosis not present

## 2022-05-08 DIAGNOSIS — K4030 Unilateral inguinal hernia, with obstruction, without gangrene, not specified as recurrent: Secondary | ICD-10-CM | POA: Insufficient documentation

## 2022-05-08 DIAGNOSIS — I2510 Atherosclerotic heart disease of native coronary artery without angina pectoris: Secondary | ICD-10-CM | POA: Insufficient documentation

## 2022-05-08 DIAGNOSIS — Z6832 Body mass index (BMI) 32.0-32.9, adult: Secondary | ICD-10-CM | POA: Insufficient documentation

## 2022-05-08 DIAGNOSIS — K429 Umbilical hernia without obstruction or gangrene: Secondary | ICD-10-CM | POA: Diagnosis not present

## 2022-05-08 DIAGNOSIS — Z87891 Personal history of nicotine dependence: Secondary | ICD-10-CM | POA: Insufficient documentation

## 2022-05-08 DIAGNOSIS — F319 Bipolar disorder, unspecified: Secondary | ICD-10-CM | POA: Insufficient documentation

## 2022-05-08 HISTORY — PX: ROBOT ASSISTED INGUINAL HERNIA REPAIR: SHX6561

## 2022-05-08 SURGERY — HERNIORRHAPHY, INGUINAL, ROBOT-ASSISTED, LAPAROSCOPIC
Anesthesia: General | Laterality: Left

## 2022-05-08 MED ORDER — CEFAZOLIN SODIUM-DEXTROSE 2-4 GM/100ML-% IV SOLN
2.0000 g | INTRAVENOUS | Status: AC
  Administered 2022-05-08: 2 g via INTRAVENOUS
  Filled 2022-05-08: qty 100

## 2022-05-08 MED ORDER — 0.9 % SODIUM CHLORIDE (POUR BTL) OPTIME
TOPICAL | Status: DC | PRN
  Administered 2022-05-08: 1000 mL

## 2022-05-08 MED ORDER — SCOPOLAMINE 1 MG/3DAYS TD PT72
MEDICATED_PATCH | TRANSDERMAL | Status: DC | PRN
  Administered 2022-05-08: 1 via TRANSDERMAL

## 2022-05-08 MED ORDER — FENTANYL CITRATE PF 50 MCG/ML IJ SOSY: 25.0000 ug | PREFILLED_SYRINGE | INTRAMUSCULAR | Status: DC | PRN

## 2022-05-08 MED ORDER — OXYCODONE HCL 5 MG/5ML PO SOLN: 5.0000 mg | Freq: Once | ORAL | Status: DC | PRN

## 2022-05-08 MED ORDER — ACETAMINOPHEN 650 MG RE SUPP: 650.0000 mg | RECTAL | Status: DC | PRN

## 2022-05-08 MED ORDER — ROCURONIUM BROMIDE 10 MG/ML (PF) SYRINGE
PREFILLED_SYRINGE | INTRAVENOUS | Status: AC
  Filled 2022-05-08: qty 10

## 2022-05-08 MED ORDER — CHLORHEXIDINE GLUCONATE CLOTH 2 % EX PADS: 6.0000 | MEDICATED_PAD | Freq: Once | CUTANEOUS | Status: DC

## 2022-05-08 MED ORDER — LACTATED RINGERS IV SOLN: INTRAVENOUS | Status: DC

## 2022-05-08 MED ORDER — BUPIVACAINE LIPOSOME 1.3 % IJ SUSP: 20.0000 mL | Freq: Once | INTRAMUSCULAR | Status: DC

## 2022-05-08 MED ORDER — DEXAMETHASONE SODIUM PHOSPHATE 10 MG/ML IJ SOLN
INTRAMUSCULAR | Status: AC
  Filled 2022-05-08: qty 1

## 2022-05-08 MED ORDER — FENTANYL CITRATE (PF) 100 MCG/2ML IJ SOLN
INTRAMUSCULAR | Status: DC | PRN
  Administered 2022-05-08: 100 ug via INTRAVENOUS
  Administered 2022-05-08 (×2): 50 ug via INTRAVENOUS

## 2022-05-08 MED ORDER — ACETAMINOPHEN 325 MG PO TABS: 650.0000 mg | ORAL_TABLET | ORAL | Status: DC | PRN

## 2022-05-08 MED ORDER — PROPOFOL 10 MG/ML IV BOLUS
INTRAVENOUS | Status: AC
  Filled 2022-05-08: qty 20

## 2022-05-08 MED ORDER — DOCUSATE SODIUM 100 MG PO CAPS: 100.0000 mg | ORAL_CAPSULE | Freq: Two times a day (BID) | ORAL | 0 refills | Status: AC

## 2022-05-08 MED ORDER — MIDAZOLAM HCL 2 MG/2ML IJ SOLN
INTRAMUSCULAR | Status: AC
  Filled 2022-05-08: qty 2

## 2022-05-08 MED ORDER — SUGAMMADEX SODIUM 200 MG/2ML IV SOLN
INTRAVENOUS | Status: DC | PRN
  Administered 2022-05-08: 200 mg via INTRAVENOUS

## 2022-05-08 MED ORDER — CHLORHEXIDINE GLUCONATE 0.12 % MT SOLN
15.0000 mL | Freq: Once | OROMUCOSAL | Status: AC
  Administered 2022-05-08: 15 mL via OROMUCOSAL

## 2022-05-08 MED ORDER — ROCURONIUM BROMIDE 10 MG/ML (PF) SYRINGE
PREFILLED_SYRINGE | INTRAVENOUS | Status: DC | PRN
  Administered 2022-05-08 (×2): 20 mg via INTRAVENOUS
  Administered 2022-05-08: 80 mg via INTRAVENOUS

## 2022-05-08 MED ORDER — PROPOFOL 10 MG/ML IV BOLUS
INTRAVENOUS | Status: DC | PRN
  Administered 2022-05-08: 200 mg via INTRAVENOUS

## 2022-05-08 MED ORDER — LABETALOL HCL 5 MG/ML IV SOLN: 5.0000 mg | INTRAVENOUS | Status: DC | PRN

## 2022-05-08 MED ORDER — FENTANYL CITRATE (PF) 100 MCG/2ML IJ SOLN
INTRAMUSCULAR | Status: AC
  Filled 2022-05-08: qty 2

## 2022-05-08 MED ORDER — SODIUM CHLORIDE 0.9 % IV SOLN: 250.0000 mL | INTRAVENOUS | Status: DC | PRN

## 2022-05-08 MED ORDER — ACETAMINOPHEN 500 MG PO TABS
1000.0000 mg | ORAL_TABLET | ORAL | Status: AC
  Administered 2022-05-08: 1000 mg via ORAL
  Filled 2022-05-08: qty 2

## 2022-05-08 MED ORDER — AMISULPRIDE (ANTIEMETIC) 5 MG/2ML IV SOLN: 10.0000 mg | Freq: Once | INTRAVENOUS | Status: DC | PRN

## 2022-05-08 MED ORDER — ORAL CARE MOUTH RINSE: 15.0000 mL | Freq: Once | OROMUCOSAL | Status: AC

## 2022-05-08 MED ORDER — ONDANSETRON HCL 4 MG/2ML IJ SOLN
INTRAMUSCULAR | Status: AC
  Filled 2022-05-08: qty 2

## 2022-05-08 MED ORDER — HYDROMORPHONE HCL 1 MG/ML IJ SOLN: 0.2500 mg | INTRAMUSCULAR | Status: DC | PRN

## 2022-05-08 MED ORDER — OXYCODONE HCL 5 MG PO TABS: 5.0000 mg | ORAL_TABLET | Freq: Three times a day (TID) | ORAL | 0 refills | Status: AC | PRN

## 2022-05-08 MED ORDER — SCOPOLAMINE 1 MG/3DAYS TD PT72
MEDICATED_PATCH | TRANSDERMAL | Status: AC
  Filled 2022-05-08: qty 1

## 2022-05-08 MED ORDER — SODIUM CHLORIDE 0.9% FLUSH: 3.0000 mL | INTRAVENOUS | Status: DC | PRN

## 2022-05-08 MED ORDER — LIDOCAINE HCL (PF) 2 % IJ SOLN
INTRAMUSCULAR | Status: AC
  Filled 2022-05-08: qty 5

## 2022-05-08 MED ORDER — ONDANSETRON HCL 4 MG/2ML IJ SOLN
INTRAMUSCULAR | Status: DC | PRN
  Administered 2022-05-08: 4 mg via INTRAVENOUS

## 2022-05-08 MED ORDER — DEXAMETHASONE SODIUM PHOSPHATE 10 MG/ML IJ SOLN
INTRAMUSCULAR | Status: DC | PRN
  Administered 2022-05-08: 10 mg via INTRAVENOUS

## 2022-05-08 MED ORDER — GABAPENTIN 300 MG PO CAPS
300.0000 mg | ORAL_CAPSULE | ORAL | Status: AC
  Administered 2022-05-08: 300 mg via ORAL
  Filled 2022-05-08: qty 1

## 2022-05-08 MED ORDER — BUPIVACAINE HCL (PF) 0.25 % IJ SOLN
INTRAMUSCULAR | Status: AC
  Filled 2022-05-08: qty 30

## 2022-05-08 MED ORDER — OXYCODONE HCL 5 MG PO TABS: 5.0000 mg | ORAL_TABLET | Freq: Once | ORAL | Status: DC | PRN

## 2022-05-08 MED ORDER — MIDAZOLAM HCL 5 MG/5ML IJ SOLN
INTRAMUSCULAR | Status: DC | PRN
  Administered 2022-05-08: 2 mg via INTRAVENOUS

## 2022-05-08 MED ORDER — SODIUM CHLORIDE 0.9% FLUSH: 3.0000 mL | Freq: Two times a day (BID) | INTRAVENOUS | Status: DC

## 2022-05-08 MED ORDER — HYDROMORPHONE HCL 1 MG/ML IJ SOLN
INTRAMUSCULAR | Status: AC
  Administered 2022-05-08: 0.5 mg via INTRAVENOUS
  Filled 2022-05-08: qty 1

## 2022-05-08 MED ORDER — LIDOCAINE 2% (20 MG/ML) 5 ML SYRINGE
INTRAMUSCULAR | Status: DC | PRN
  Administered 2022-05-08: 100 mg via INTRAVENOUS

## 2022-05-08 MED ORDER — OXYCODONE HCL 5 MG PO TABS: 5.0000 mg | ORAL_TABLET | ORAL | Status: DC | PRN

## 2022-05-08 MED ORDER — BUPIVACAINE LIPOSOME 1.3 % IJ SUSP
INTRAMUSCULAR | Status: DC | PRN
  Administered 2022-05-08: 50 mL

## 2022-05-08 MED ORDER — BUPIVACAINE LIPOSOME 1.3 % IJ SUSP
INTRAMUSCULAR | Status: AC
  Filled 2022-05-08: qty 20

## 2022-05-08 MED ORDER — ONDANSETRON HCL 4 MG/2ML IJ SOLN: 4.0000 mg | Freq: Once | INTRAMUSCULAR | Status: DC | PRN

## 2022-05-08 SURGICAL SUPPLY — 48 items
ANTIFOG SOL W/FOAM PAD STRL (MISCELLANEOUS) ×1
APPLICATOR COTTON TIP 6 STRL (MISCELLANEOUS) ×2 IMPLANT
APPLICATOR COTTON TIP 6IN STRL (MISCELLANEOUS) ×2
BAG COUNTER SPONGE SURGICOUNT (BAG) IMPLANT
BLADE SURG SZ11 CARB STEEL (BLADE) ×1 IMPLANT
CHLORAPREP W/TINT 26 (MISCELLANEOUS) ×1 IMPLANT
COVER SURGICAL LIGHT HANDLE (MISCELLANEOUS) ×1 IMPLANT
COVER TIP SHEARS 8 DVNC (MISCELLANEOUS) ×1 IMPLANT
COVER TIP SHEARS 8MM DA VINCI (MISCELLANEOUS) ×1
DERMABOND ADVANCED .7 DNX12 (GAUZE/BANDAGES/DRESSINGS) IMPLANT
DRAPE ARM DVNC X/XI (DISPOSABLE) ×4 IMPLANT
DRAPE COLUMN DVNC XI (DISPOSABLE) ×1 IMPLANT
DRAPE DA VINCI XI ARM (DISPOSABLE) ×4
DRAPE DA VINCI XI COLUMN (DISPOSABLE) ×1
ELECT REM PT RETURN 15FT ADLT (MISCELLANEOUS) ×1 IMPLANT
GLOVE BIO SURGEON STRL SZ 6 (GLOVE) ×2 IMPLANT
GLOVE INDICATOR 6.5 STRL GRN (GLOVE) ×2 IMPLANT
GLOVE SS BIOGEL STRL SZ 6 (GLOVE) ×1 IMPLANT
GOWN STRL REUS W/ TWL LRG LVL3 (GOWN DISPOSABLE) ×2 IMPLANT
GOWN STRL REUS W/ TWL XL LVL3 (GOWN DISPOSABLE) IMPLANT
GOWN STRL REUS W/TWL LRG LVL3 (GOWN DISPOSABLE) ×2
GOWN STRL REUS W/TWL XL LVL3 (GOWN DISPOSABLE)
IRRIG SUCT STRYKERFLOW 2 WTIP (MISCELLANEOUS)
IRRIGATION SUCT STRKRFLW 2 WTP (MISCELLANEOUS) IMPLANT
KIT BASIN OR (CUSTOM PROCEDURE TRAY) ×1 IMPLANT
KIT TURNOVER KIT A (KITS) IMPLANT
MESH 3DMAX MID 4X6 LT LRG (Mesh General) IMPLANT
NDL INSUFFLATION 14GA 120MM (NEEDLE) ×1 IMPLANT
NEEDLE HYPO 22GX1.5 SAFETY (NEEDLE) ×1 IMPLANT
NEEDLE INSUFFLATION 14GA 120MM (NEEDLE) ×1 IMPLANT
PACK CARDIOVASCULAR III (CUSTOM PROCEDURE TRAY) ×1 IMPLANT
PAD POSITIONING PINK XL (MISCELLANEOUS) ×1 IMPLANT
SEAL CANN UNIV 5-8 DVNC XI (MISCELLANEOUS) ×3 IMPLANT
SEAL XI 5MM-8MM UNIVERSAL (MISCELLANEOUS) ×3
SOL ELECTROSURG ANTI STICK (MISCELLANEOUS) ×1
SOLUTION ANTFG W/FOAM PAD STRL (MISCELLANEOUS) ×1 IMPLANT
SOLUTION ELECTROSURG ANTI STCK (MISCELLANEOUS) ×1 IMPLANT
SPIKE FLUID TRANSFER (MISCELLANEOUS) ×1 IMPLANT
SUT MNCRL AB 4-0 PS2 18 (SUTURE) ×1 IMPLANT
SUT VIC AB 3-0 SH 27 (SUTURE) ×2
SUT VIC AB 3-0 SH 27XBRD (SUTURE) ×1 IMPLANT
SUT VLOC 180 2-0 6IN GS21 (SUTURE) ×1 IMPLANT
SUT VLOC 3-0 9IN GRN (SUTURE) IMPLANT
SYR 10ML LL (SYRINGE) ×1 IMPLANT
SYR 20ML LL LF (SYRINGE) ×1 IMPLANT
TOWEL OR 17X26 10 PK STRL BLUE (TOWEL DISPOSABLE) ×1 IMPLANT
TOWEL OR NON WOVEN STRL DISP B (DISPOSABLE) ×1 IMPLANT
TUBING INSUFFLATION 10FT LAP (TUBING) ×1 IMPLANT

## 2022-05-08 NOTE — Interval H&P Note (Signed)
History and Physical Interval Note:  05/08/2022 8:43 AM  Carlos Tapia  has presented today for surgery, with the diagnosis of INGUINAL HERNIA.  The various methods of treatment have been discussed with the patient and family. After consideration of risks, benefits and other options for treatment, the patient has consented to  Procedure(s) with comments: ROBOTIC LEFT, POSSIBLE BILATERAL INGUINAL HERNIA REPAIR (Left) - 120 MINUTES as a surgical intervention.  The patient's history has been reviewed, patient examined, no change in status, stable for surgery.  I have reviewed the patient's chart and labs.  Questions were answered to the patient's satisfaction.     Simuel Stebner Rich Brave

## 2022-05-08 NOTE — Anesthesia Postprocedure Evaluation (Signed)
Anesthesia Post Note  Patient: Carlos Tapia  Procedure(s) Performed: ROBOTIC LEFT INGUINAL HERNIA REPAIR (Left)     Patient location during evaluation: PACU Anesthesia Type: General Level of consciousness: awake and alert and oriented Pain management: pain level controlled Vital Signs Assessment: post-procedure vital signs reviewed and stable Respiratory status: spontaneous breathing, nonlabored ventilation and respiratory function stable Cardiovascular status: blood pressure returned to baseline and stable Postop Assessment: no apparent nausea or vomiting Anesthetic complications: no   No notable events documented.  Last Vitals:  Vitals:   05/08/22 1145 05/08/22 1200  BP: (!) 157/98 (!) 153/97  Pulse: 88 75  Resp: 15 18  Temp:  36.5 C  SpO2: 100% 97%    Last Pain:  Vitals:   05/08/22 1200  TempSrc:   PainSc: 3                  Moroni Nester,Demontre A.

## 2022-05-08 NOTE — Op Note (Signed)
Operative Note  Carlos Tapia  [id number]  [id number]  05/08/2022   Surgeon: Clovis Riley MD   Assistant: Malachi Pro PA-C   Procedure performed: Robotic (transabdominal preperitoneal) left inguinal hernia repair with mesh   Preop diagnosis: Left inguinal hernia Post-op diagnosis/intraop findings: Moderate direct left inguinal hernia containing incarcerated preperitoneal fat.  Minimal indirect space divot on the right side but no hernia.  No gross abnormality of the visible rectum, very redundant sigmoid colon   Specimens: no Retained items: no  EBL: Minimal cc Complications: none   Description of procedure: After obtaining informed consent the patient was taken to the operating room and placed supine on operating room table where general endotracheal anesthesia was initiated, preoperative antibiotics were administered, SCDs applied, foley inserted and a formal timeout was performed. The abdomen was clipped, prepped and draped in usual sterile fashion. Peritoneal access was gained with a left subcostal Veress needle and insufflation to 15 mmHg ensued without incident.  A left periumbilical 8 mm trocar and camera were inserted. The abdomen was inspected and confirmed to be free of any injury from our entry or gross abnormalities. The patient was then placed in steep Trendelenburg and the pelvis inspected.  He has noted to have a redundant sigmoid colon, the visible portion of the rectum appears normal, no intraperitoneal abnormalities in the pelvis to explain his rectal spasm type pain that he described preoperatively.  There is a tiny divot in the indirect location on the right side but no hernia on the right.  There is a moderate direct hernia on the left. Under direct visualization, bilateral laparoscopic assisted tap blocks were performed using Exparel mixed with quarter percent Marcaine.  Bilateral 8 mm robotic trocars were placed and the robot was docked, all instruments inserted under  direct visualization.   The peritoneal flap was developed using sharp and cautery dissection spanning from the left anterior superior iliac spine to the left medial umbilical ligament.  Combination of blunt, sharp and cautery dissection proceeded to carefully develop the preperitoneal space.  The gonadal vessels and vas deferens were peritonealized.  The iliac vessels were exposed.  The direct hernia was reduced and noted to contain incarcerated preperitoneal fat.  The space of Retzius was developed bluntly, extending about 2 cm caudad to the Cooper's ligament.  Hemostasis was confirmed and the dissection field. A Bard 3D max mid weight large left-sided mesh was then inserted with excellent overlap of the indirect, direct, and femoral spaces.  This was secured to the Cooper's ligament as well as superiorly on either side of the inferior epigastric vessels with simple interrupted 3-0 Vicryl's.  The peritoneal flap was then brought back up to cover the mesh, ensuring that the mesh remained flush against the anterior abdominal wall with no folding or buckling while doing so.  The peritoneum was closed with a running imbricating 3-0V-loc suture. At completion there was no exposed mesh.  The abdomen was once again inspected and confirmed to be free of injury or any other abnormalities.  After removing all needles and instruments under direct visualization, the robot was undocked, the abdomen was desufflated, and the trocars removed.  The skin incisions were closed with subcuticular 4-0 Monocryl and Dermabond. The patient was then awakened, extubated and taken to PACU in stable condition.  All counts were correct at the completion of the case.

## 2022-05-08 NOTE — Anesthesia Procedure Notes (Signed)
Procedure Name: Intubation Date/Time: 05/08/2022 9:33 AM  Performed by: Lind Covert, CRNAPre-anesthesia Checklist: Patient identified, Emergency Drugs available, Suction available, Patient being monitored and Timeout performed Patient Re-evaluated:Patient Re-evaluated prior to induction Oxygen Delivery Method: Circle system utilized Preoxygenation: Pre-oxygenation with 100% oxygen Induction Type: IV induction Ventilation: Mask ventilation without difficulty and Oral airway inserted - appropriate to patient size Laryngoscope Size: Mac and 4 Grade View: Grade I Tube type: Oral Tube size: 7.5 mm Number of attempts: 1 Placement Confirmation: ETT inserted through vocal cords under direct vision, positive ETCO2 and breath sounds checked- equal and bilateral Secured at: 23 cm Tube secured with: Tape Dental Injury: Teeth and Oropharynx as per pre-operative assessment

## 2022-05-08 NOTE — Discharge Instructions (Signed)
HERNIA REPAIR: POST OP INSTRUCTIONS   EAT Gradually transition to a high fiber diet with a fiber supplement over the next few weeks after discharge.  Start with a pureed / full liquid diet (see below)  WALK Walk an hour a day (cumulative- not all at once).  Control your pain to do that.    CONTROL PAIN Control pain so that you can walk, sleep, tolerate sneezing/coughing, and go up/down stairs.  HAVE A BOWEL MOVEMENT DAILY Keep your bowels regular to avoid problems.  OK to try a laxative to override constipation.  OK to use an antidiarrheal to slow down diarrhea.  Call if not better after 2 tries  CALL IF YOU HAVE PROBLEMS/CONCERNS Call if you are still struggling despite following these instructions. Call if you have concerns not answered by these instructions  ######################################################################    DIET: Follow a light bland diet & liquids the first 24 hours after arrival home, such as soup, liquids, starches, etc.  Be sure to drink plenty of fluids.  Quickly advance to a usual solid diet within a few days.  Avoid fast food or heavy meals initially as you are more likely to get nauseated or have irregular bowels.   Take your usually prescribed home medications unless otherwise directed.  PAIN CONTROL: Pain is best controlled by a usual combination of three different methods TOGETHER: Ice/Heat Over the counter pain medication Prescription pain medication Most patients will experience some swelling and bruising around the hernia(s) such as the bellybutton, groins, or old incisions.  Ice packs or heating pads (30-60 minutes up to 6 times a day) will help. Use ice for the first few days to help decrease swelling and bruising, then switch to heat to help relax tight/sore spots and speed recovery.  Some people prefer to use ice alone, heat alone, alternating between ice & heat.  Experiment to what works for you.  Swelling and bruising can take several  weeks to resolve.   It is helpful to take an over-the-counter pain medication regularly for the first days: Naproxen (Aleve, etc)  Two 220mg tabs twice a day OR Ibuprofen (Advil, etc) Three 200mg tabs four times a day (every meal & bedtime) AND Acetaminophen (Tylenol, etc) 325-650mg four times a day (every meal & bedtime) A  prescription for pain medication should be given to you upon discharge.  Take your pain medication as prescribed, IF NEEDED.  If you are having problems/concerns with the prescription medicine (does not control pain, nausea, vomiting, rash, itching, etc), please call us [phone] to see if we need to switch you to a different pain medicine that will work better for you and/or control your side effect better. If you need a refill on your pain medication, please contact your pharmacy.  They will contact our office to request authorization. Prescriptions will not be filled after 5 pm or on week-ends.  Avoid getting constipated.  Between the surgery and the pain medications, it is common to experience some constipation.  Increasing fluid intake and taking a fiber supplement (such as Metamucil, Citrucel, FiberCon, MiraLax, etc) 1-2 times a day regularly will usually help prevent this problem from occurring.  A mild laxative (prune juice, Milk of Magnesia, MiraLax, etc) should be taken according to package directions if there are no bowel movements after 48 hours.    Wash / shower every day, starting 2 days after surgery.  You may shower over the skin glue which is waterproof.  No rubbing, scrubbing, lotions or ointments   to incision(s). Do not soak or submerge.   Glue will flake off after about 2 weeks.  You may leave the incision open to air.  You may replace a dressing/Band-Aid to cover an incision for comfort if you wish.  Continue to shower over incision(s) after the dressing is off.  ACTIVITIES as tolerated:   You may resume regular (light) daily activities beginning the next  day--such as daily self-care, walking, climbing stairs--gradually increasing activities as tolerated.  Control your pain so that you can walk an hour a day.  If you can walk 30 minutes without difficulty, it is safe to try more intense activity such as jogging, treadmill, bicycling, low-impact aerobics, swimming, etc. Refrain from the most intensive and strenuous activity such as sit-ups, heavy lifting, contact sports, etc  Refrain from any heavy lifting or straining until 6 weeks after surgery.   DO NOT PUSH THROUGH PAIN.  Let pain be your guide: If it hurts to do something, don't do it.  Pain is your body warning you to avoid that activity for another week until the pain goes down. You may drive when you are no longer taking prescription pain medication, you can comfortably wear a seatbelt, and you can safely maneuver your car and apply brakes. You may have sexual intercourse when it is comfortable.   FOLLOW UP in our office Please call [facility] at [phone] to set up an appointment to see your surgeon in the office for a follow-up appointment approximately 2-3 weeks after your surgery. Make sure that you call for this appointment the day you arrive home to insure a convenient appointment time.  9.  If you have disability of FMLA / Family leave forms, please bring the forms to the office for processing.  (do not give to your surgeon).  WHEN TO CALL US [phone]: Poor pain control Reactions / problems with new medications (rash/itching, nausea, etc)  Fever over 101.5 F (38.5 C) Inability to urinate Nausea and/or vomiting Worsening swelling or bruising Continued bleeding from incision. Increased pain, redness, or drainage from the incision   The clinic staff is available to answer your questions during regular business hours (8:30am-5pm).  Please don't hesitate to call and ask to speak to one of our nurses for clinical concerns.   If you have a medical emergency, go to the nearest  emergency room or call 911.  A surgeon from [facility] is always on call at the hospitals in [place]  [facility] [address], [address], [place], [place]  [address] ?  [address] [address], [place], [place]   [address] MAIN: [phone] ? TOLL FREE: [phone] ? FAX: [phone] [email or web address]  

## 2022-05-08 NOTE — Transfer of Care (Signed)
Immediate Anesthesia Transfer of Care Note  Patient: Luciano Cinquemani Santori  Procedure(s) Performed: ROBOTIC LEFT INGUINAL HERNIA REPAIR (Left)  Patient Location: PACU  Anesthesia Type:General  Level of Consciousness: sedated  Airway & Oxygen Therapy: Patient Spontanous Breathing and Patient connected to face mask oxygen  Post-op Assessment: Report given to RN and Post -op Vital signs reviewed and stable  Post vital signs: Reviewed and stable  Last Vitals:  Vitals Value Taken Time  BP 164/95 05/08/22 1125  Temp    Pulse 66 05/08/22 1125  Resp 11 05/08/22 1125  SpO2 100 % 05/08/22 1125  Vitals shown include unvalidated device data.  Last Pain:  Vitals:   05/08/22 0829  TempSrc:   PainSc: 0-No pain      Patients Stated Pain Goal: 3 (83/72/90 2111)  Complications: No notable events documented.

## 2022-05-11 ENCOUNTER — Encounter: Payer: BC Managed Care – PPO | Admitting: Physical Therapy

## 2022-05-14 ENCOUNTER — Encounter: Payer: Self-pay | Admitting: Internal Medicine

## 2022-05-14 NOTE — Progress Notes (Unsigned)
    Subjective:    Patient ID: Carlos Tapia, male    DOB: 08-Feb-1971, 52 y.o.   MRN: [id number]      HPI Tyreik is here for No chief complaint on file.       BP Readings from Last 3 Encounters:  05/08/22 (!) 157/100  05/04/22 (!) 149/86  04/26/22 130/80      Medications and allergies reviewed with patient and updated if appropriate.  Current Outpatient Medications on File Prior to Visit  Medication Sig Dispense Refill   Cholecalciferol (VITAMIN D-3) 125 MCG (5000 UT) TABS Take 5,000 Units by mouth daily.     Creatine POWD Take 5 mg by mouth daily.     diphenhydramine-acetaminophen (TYLENOL PM) 25-500 MG TABS tablet Take 2 tablets by mouth at bedtime as needed (sleep).     docusate sodium (COLACE) 100 MG capsule Take 1 capsule (100 mg total) by mouth 2 (two) times daily. Okay to decrease to once daily or stop taking if having loose bowel movements 30 capsule 0   lamoTRIgine (LAMICTAL) 100 MG tablet Take 1 tablet (100 mg total) by mouth daily. 30 tablet 1   Multiple Vitamins-Minerals (MULTIVITAMIN WITH MINERALS) tablet Take 1 tablet by mouth daily.     No current facility-administered medications on file prior to visit.    Review of Systems     Objective:  There were no vitals filed for this visit. BP Readings from Last 3 Encounters:  05/08/22 (!) 157/100  05/04/22 (!) 149/86  04/26/22 130/80   Wt Readings from Last 3 Encounters:  05/08/22 229 lb (103.9 kg)  05/04/22 229 lb (103.9 kg)  04/26/22 224 lb 3.2 oz (101.7 kg)   There is no height or weight on file to calculate BMI.    Physical Exam         Assessment & Plan:    See Problem List for Assessment and Plan of chronic medical problems.

## 2022-05-15 ENCOUNTER — Ambulatory Visit: Payer: BC Managed Care – PPO | Admitting: Internal Medicine

## 2022-05-15 VITALS — BP 140/84 | HR 65 | Temp 98.5°F | Ht 70.0 in | Wt 229.0 lb

## 2022-05-15 DIAGNOSIS — R030 Elevated blood-pressure reading, without diagnosis of hypertension: Secondary | ICD-10-CM | POA: Diagnosis not present

## 2022-05-15 DIAGNOSIS — J019 Acute sinusitis, unspecified: Secondary | ICD-10-CM

## 2022-05-15 MED ORDER — AMOXICILLIN-POT CLAVULANATE 875-125 MG PO TABS: 1.0000 | ORAL_TABLET | Freq: Two times a day (BID) | ORAL | 0 refills | Status: AC

## 2022-05-15 NOTE — Patient Instructions (Addendum)
       Medications changes include :   Augmentin x 10 days    Return if symptoms worsen or fail to improve.  

## 2022-05-18 ENCOUNTER — Other Ambulatory Visit (HOSPITAL_BASED_OUTPATIENT_CLINIC_OR_DEPARTMENT_OTHER): Payer: BC Managed Care – PPO

## 2022-06-05 ENCOUNTER — Encounter (HOSPITAL_COMMUNITY): Payer: Self-pay | Admitting: Psychiatry

## 2022-06-05 ENCOUNTER — Telehealth (HOSPITAL_BASED_OUTPATIENT_CLINIC_OR_DEPARTMENT_OTHER): Payer: BC Managed Care – PPO | Admitting: Psychiatry

## 2022-06-05 VITALS — Wt 229.0 lb

## 2022-06-05 DIAGNOSIS — F319 Bipolar disorder, unspecified: Secondary | ICD-10-CM | POA: Diagnosis not present

## 2022-06-05 DIAGNOSIS — T1490XA Injury, unspecified, initial encounter: Secondary | ICD-10-CM | POA: Diagnosis not present

## 2022-06-05 DIAGNOSIS — F1210 Cannabis abuse, uncomplicated: Secondary | ICD-10-CM

## 2022-06-05 NOTE — Progress Notes (Signed)
[facility] MD Virtual Progress Note   Patient Location: Home Provider Location: Home Office  I connect with patient by video and verified that I am speaking with correct person by using two identifiers. I discussed the limitations of evaluation and management by telemedicine and the availability of in person appointments. I also discussed with the patient that there may be a patient responsible charge related to this service. The patient expressed understanding and agreed to proceed.  Carlos Tapia [id number] 53 y.o.  06/05/2022 3:35 PM    History of Present Illness:  Patient is evaluated by video session.  He reported a lot of things happen since the last visit.  He was having abdominal pain and feeling very nervous and anxious and having panic attacks.  He was seen by the doctor a few times until given the diagnosis of hernia.  He was told CT shows 3 hernias and he required surgery.  After the surgery he is feeling better.  He admitted stopped taking the Lamictal because he was not sure what was causing his stomach pain.  He has been without the Lamictal since mid January.  He cut down his Lamictal gradually from 150-100 and then 50 mg.  He did tried hydroxyzine but that night he was up all night and he was not sure if the hydroxyzine cause insomnia or his general anxiety due to his health issues.  He feels very anxious, sleep only 4 to 5 hours.  He feels very tense and his wife reported that he has no break in his thought process. His speech is pressured.  His tone is increased.  Denies any mania or psychosis but feels very nervous and anxious around strangers.  He cut down his smoking marijuana but due to his health issues and busy with doctor's appointment and surgery not able to see the schedule therapist.  His past but still bother him.  He is not sure his current level of anxiety increased due to his recent surgery or something else.  He like to explore further his anxiety and  like to calm himself.  He denies any hallucination, suicidal thoughts or agitation.  He is supposed to go back to work on 18th.  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  Past Psychiatric History: History of difficult childhood.  Involvement in gang crime and served jail time for 3 years and 3 years probation.  Never seen psychiatrist, counseling or any psychotropic medication.  No history of suicidal attempt.  History of mood swing, anger issues.  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  Smoked marijuana on a daily basis.  No history of DUI.Marland Kitchen  Hydroxyzine 10 mg but unclear if that cause insomnia.   Recent Results (from the past 2160 hour(s))  POCT Glucose (CBG)     Status: Normal   Collection Time: 04/17/22  1:58 PM  Result Value Ref Range   POC Glucose 98 70 - 99 mg/dl  POC COVID-19 BinaxNow     Status: Normal   Collection Time: 04/17/22  2:32 PM  Result Value Ref Range   SARS Coronavirus 2 Ag Negative Negative  CBC     Status: None   Collection Time: 04/17/22  3:48 PM  Result Value Ref Range   WBC 7.2 4.0 - 10.5 K/uL   RBC 5.03 4.22 - 5.81 MIL/uL   Hemoglobin 15.4 13.0 - 17.0 g/dL   HCT 46.8 39.0 -  52.0 %   MCV 93.0 80.0 - 100.0 fL   MCH 30.6 26.0 - 34.0 pg   MCHC 32.9 30.0 - 36.0 g/dL   RDW 13.2 11.5 - 15.5 %   Platelets 252 150 - 400 K/uL   nRBC 0.0 0.0 - 0.2 %    Comment: Performed at [facility], [address] [place], [place] [address]  Urinalysis, Routine w reflex microscopic Urine, Clean Catch     Status: Abnormal   Collection Time: 04/17/22  3:48 PM  Result Value Ref Range   Color, Urine YELLOW YELLOW   APPearance CLEAR CLEAR   Specific Gravity, Urine 1.020 1.005 - 1.030   pH 5.5 5.0 - 8.0   Glucose, UA NEGATIVE NEGATIVE mg/dL   Hgb urine dipstick MODERATE (A) NEGATIVE   Bilirubin Urine NEGATIVE NEGATIVE   Ketones, ur NEGATIVE NEGATIVE mg/dL   Protein, ur NEGATIVE NEGATIVE  mg/dL   Nitrite NEGATIVE NEGATIVE   Leukocytes,Ua NEGATIVE NEGATIVE   RBC / HPF 0-5 0 - 5 RBC/hpf   WBC, UA 0-5 0 - 5 WBC/hpf   Bacteria, UA NONE SEEN NONE SEEN   Squamous Epithelial / HPF 0-5 0 - 5 /HPF    Comment: Performed at [facility], [address] [place], [place] [address]  Comprehensive metabolic panel     Status: Abnormal   Collection Time: 04/17/22  3:48 PM  Result Value Ref Range   Sodium 139 135 - 145 mmol/L   Potassium 4.1 3.5 - 5.1 mmol/L   Chloride 103 98 - 111 mmol/L   CO2 29 22 - 32 mmol/L   Glucose, Bld 84 70 - 99 mg/dL    Comment: Glucose reference range applies only to samples taken after fasting for at least 8 hours.   BUN 20 6 - 20 mg/dL   Creatinine, Ser 0.79 0.61 - 1.24 mg/dL   Calcium 9.6 8.9 - 10.3 mg/dL   Total Protein 7.4 6.5 - 8.1 g/dL   Albumin 4.7 3.5 - 5.0 g/dL   AST 17 15 - 41 U/L   ALT 19 0 - 44 U/L   Alkaline Phosphatase 73 38 - 126 U/L   Total Bilirubin 0.2 (L) 0.3 - 1.2 mg/dL   GFR, Estimated >60 >60 mL/min    Comment: (NOTE) Calculated using the CKD-EPI Creatinine Equation (2021)    Anion gap 7 5 - 15    Comment: Performed at [facility], [address] [place], [place] [address]  Lipase, blood     Status: None   Collection Time: 04/17/22  3:48 PM  Result Value Ref Range   Lipase 44 11 - 51 U/L    Comment: Performed at [facility], [address] [place], [place] [address]  Resp panel by RT-PCR (RSV, Flu A&B, Covid) Urine, Clean Catch     Status: None   Collection Time: 04/17/22  3:48 PM   Specimen: Urine, Clean Catch; Nasal Swab  Result Value Ref Range   SARS Coronavirus 2 by RT PCR NEGATIVE NEGATIVE    Comment: (NOTE) SARS-CoV-2 target nucleic acids are NOT DETECTED.  The SARS-CoV-2 RNA is generally detectable in upper respiratory specimens during the acute phase of infection. The lowest concentration of SARS-CoV-2 viral copies this assay can detect is 138  copies/mL. A negative result does not preclude SARS-Cov-2 infection and should not be used as the sole basis for treatment or other patient management decisions. A negative result may occur with  improper specimen collection/handling, submission of  specimen other than nasopharyngeal swab, presence of viral mutation(s) within the areas targeted by this assay, and inadequate number of viral copies(<138 copies/mL). A negative result must be combined with clinical observations, patient history, and epidemiological information. The expected result is Negative.  Fact Sheet for Patients:  EntrepreneurPulse.com.au  Fact Sheet for Healthcare Providers:  IncredibleEmployment.be  This test is no t yet approved or cleared by the [place] FDA and  has been authorized for detection and/or diagnosis of SARS-CoV-2 by FDA under an Emergency Use Authorization (EUA). This EUA will remain  in effect (meaning this test can be used) for the duration of the COVID-19 declaration under Section 564(b)(1) of the Act, 21 U.S.C.section 360bbb-3(b)(1), unless the authorization is terminated  or revoked sooner.       Influenza A by PCR NEGATIVE NEGATIVE   Influenza B by PCR NEGATIVE NEGATIVE    Comment: (NOTE) The Xpert Xpress SARS-CoV-2/FLU/RSV plus assay is intended as an aid in the diagnosis of influenza from Nasopharyngeal swab specimens and should not be used as a sole basis for treatment. Nasal washings and aspirates are unacceptable for Xpert Xpress SARS-CoV-2/FLU/RSV testing.  Fact Sheet for Patients: EntrepreneurPulse.com.au  Fact Sheet for Healthcare Providers: IncredibleEmployment.be  This test is not yet approved or cleared by the [place] FDA and has been authorized for detection and/or diagnosis of SARS-CoV-2 by FDA under an Emergency Use Authorization (EUA). This EUA will remain in effect (meaning this test  can be used) for the duration of the COVID-19 declaration under Section 564(b)(1) of the Act, 21 U.S.C. section 360bbb-3(b)(1), unless the authorization is terminated or revoked.     Resp Syncytial Virus by PCR NEGATIVE NEGATIVE    Comment: (NOTE) Fact Sheet for Patients: EntrepreneurPulse.com.au  Fact Sheet for Healthcare Providers: IncredibleEmployment.be  This test is not yet approved or cleared by the [place] FDA and has been authorized for detection and/or diagnosis of SARS-CoV-2 by FDA under an Emergency Use Authorization (EUA). This EUA will remain in effect (meaning this test can be used) for the duration of the COVID-19 declaration under Section 564(b)(1) of the Act, 21 U.S.C. section 360bbb-3(b)(1), unless the authorization is terminated or revoked.  Performed at [facility], [place], [place] [address]   Troponin I (High Sensitivity)     Status: None   Collection Time: 04/17/22  3:48 PM  Result Value Ref Range   Troponin I (High Sensitivity) 3 <18 ng/L    Comment: (NOTE) Elevated high sensitivity troponin I (hsTnI) values and significant  changes across serial measurements may suggest ACS but many other  chronic and acute conditions are known to elevate hsTnI results.  Refer to the "Links" section for chest pain algorithms and additional  guidance. Performed at [facility], [place], [place] [address]   Troponin I (High Sensitivity)     Status: None   Collection Time: 04/17/22  5:20 PM  Result Value Ref Range   Troponin I (High Sensitivity) 3 <18 ng/L    Comment: (NOTE) Elevated high sensitivity troponin I (hsTnI) values and significant  changes across serial measurements may suggest ACS but many other  chronic and acute conditions are known to elevate hsTnI results.  Refer to the "Links" section for chest pain algorithms and additional   guidance. Performed at [facility], [address] [place],  [address]   CBC with Differential     Status: Abnormal   Collection Time: 04/25/22  5:26 PM  Result Value Ref Range   WBC 11.1 (H) 4.0 - 10.5 K/uL   RBC 5.24 4.22 - 5.81 MIL/uL   Hemoglobin 16.2 13.0 - 17.0 g/dL   HCT 48.5 39.0 - 52.0 %   MCV 92.6 80.0 - 100.0 fL   MCH 30.9 26.0 - 34.0 pg   MCHC 33.4 30.0 - 36.0 g/dL   RDW 12.7 11.5 - 15.5 %   Platelets 237 150 - 400 K/uL   nRBC 0.0 0.0 - 0.2 %   Neutrophils Relative % 69 %   Neutro Abs 7.7 1.7 - 7.7 K/uL   Lymphocytes Relative 22 %   Lymphs Abs 2.4 0.7 - 4.0 K/uL   Monocytes Relative 6 %   Monocytes Absolute 0.7 0.1 - 1.0 K/uL   Eosinophils Relative 2 %   Eosinophils Absolute 0.2 0.0 - 0.5 K/uL   Basophils Relative 1 %   Basophils Absolute 0.1 0.0 - 0.1 K/uL   Immature Granulocytes 0 %   Abs Immature Granulocytes 0.04 0.00 - 0.07 K/uL    Comment: Performed at [facility], [place] [address] [place], [place] [address]  Comprehensive metabolic panel     Status: Abnormal   Collection Time: 04/25/22  5:26 PM  Result Value Ref Range   Sodium 135 135 - 145 mmol/L   Potassium 3.6 3.5 - 5.1 mmol/L   Chloride 99 98 - 111 mmol/L   CO2 23 22 - 32 mmol/L   Glucose, Bld 111 (H) 70 - 99 mg/dL    Comment: Glucose reference range applies only to samples taken after fasting for at least 8 hours.   BUN 18 6 - 20 mg/dL   Creatinine, Ser 0.78 0.61 - 1.24 mg/dL   Calcium 9.3 8.9 - 10.3 mg/dL   Total Protein 7.7 6.5 - 8.1 g/dL   Albumin 4.6 3.5 - 5.0 g/dL   AST 24 15 - 41 U/L   ALT 24 0 - 44 U/L   Alkaline Phosphatase 72 38 - 126 U/L   Total Bilirubin 0.8 0.3 - 1.2 mg/dL   GFR, Estimated >60 >60 mL/min    Comment: (NOTE) Calculated using the CKD-EPI Creatinine Equation (2021)    Anion gap 13 5 - 15    Comment: Performed at [facility], [place] [address] [place], [place] [address]  Lipase, blood     Status:  None   Collection Time: 04/25/22  5:26 PM  Result Value Ref Range   Lipase 41 11 - 51 U/L    Comment: Performed at [facility], [place] [address] [place],  [address]  Lipoprotein A (LPA)     Status: None   Collection Time: 04/26/22  3:42 PM  Result Value Ref Range   Lipoprotein (a) <8.4 <75.0 nmol/L    Comment: **Results verified by repeat testing** Note:  Values greater than or equal to 75.0 nmol/L may        indicate an independent risk factor for CHD,        but must be evaluated with caution when applied        to non-Caucasian populations due to the        influence of genetic factors on Lp(a) across        ethnicities.      Psychiatric Specialty Exam: Physical Exam  Review of Systems  Weight 229 lb (103.9 kg).There is no height or weight on file to calculate BMI.  General Appearance: Casual  Eye Contact:  Good  Speech:  Clear and Coherent  Volume:  Increased  Mood:  Anxious  Affect:  Congruent  Thought Process:  Goal Directed  Orientation:  Full (Time, Place, and Person)  Thought Content:  Rumination  Suicidal Thoughts:  No  Homicidal Thoughts:  No  Memory:  Immediate;   Good Recent;   Good Remote;   Good  Judgement:  Good  Insight:  Present  Psychomotor Activity:  Increased  Concentration:  Concentration: Good and Attention Span: Good  Recall:  Good  Fund of Knowledge:  Good  Language:  Good  Akathisia:  No  Handed:  Right  AIMS (if indicated):     Assets:  Communication Skills Desire for Improvement Housing Resilience Social Support  ADL's:  Intact  Cognition:  WNL  Sleep:  4-5 hrs     Assessment/Plan: Bipolar I disorder (HCC)  Mild tetrahydrocannabinol (THC) abuse  Trauma in childhood  I reviewed notes from recent hospitalization, blood work results.  He do not recall having any side effects of Lamictal like rash or any itching.  Patient has surgery for hernia that may have triggered his anxiety.  He is not taking the  Lamictal but he has refills of Lamictal 100 mg from his PCP and 150 mg from [place].  I recommend he should go back to Lamictal to see if that helps his anxiety and mood.  If he remains anxious and nervous then we will consider further medication adjustment.  I also emphasized consider therapy for his childhood trauma.  Discussed stopping the marijuana.  Patient will start Lamictal 50 mg for 1 week then 100 mg for another week and then 150 mg daily.  We will follow-up in 4 to 5 weeks.  I recommend to call us back if he has any question, concern or worsening of symptoms.   Follow Up Instructions:     I discussed the assessment and treatment plan with the patient. The patient was provided an opportunity to ask questions and all were answered. The patient agreed with the plan and demonstrated an understanding of the instructions.   The patient was advised to call back or seek an in-person evaluation if the symptoms worsen or if the condition fails to improve as anticipated.    Collaboration of Care: Other provider involved in patient's care AEB notes are available in epic to review.  Patient/Guardian was advised Release of Information must be obtained prior to any record release in order to collaborate their care with an outside provider. Patient/Guardian was advised if they have not already done so to contact the registration department to sign all necessary forms in order for [place] to release information regarding their care.   Consent: Patient/Guardian gives verbal consent for treatment and assignment of benefits for services provided during this visit. Patient/Guardian expressed understanding and agreed to proceed.     I provided 25 minutes of non face to face time during this encounter.  Kathlee Nations, MD 06/05/2022

## 2022-06-09 ENCOUNTER — Encounter: Payer: Self-pay | Admitting: Internal Medicine

## 2022-06-09 ENCOUNTER — Ambulatory Visit (INDEPENDENT_AMBULATORY_CARE_PROVIDER_SITE_OTHER): Payer: BC Managed Care – PPO | Admitting: Internal Medicine

## 2022-06-09 ENCOUNTER — Other Ambulatory Visit: Payer: Self-pay | Admitting: Internal Medicine

## 2022-06-09 VITALS — BP 124/76 | HR 65 | Temp 98.0°F | Ht 70.0 in | Wt 237.0 lb

## 2022-06-09 DIAGNOSIS — E7800 Pure hypercholesterolemia, unspecified: Secondary | ICD-10-CM | POA: Diagnosis not present

## 2022-06-09 DIAGNOSIS — E538 Deficiency of other specified B group vitamins: Secondary | ICD-10-CM

## 2022-06-09 DIAGNOSIS — E559 Vitamin D deficiency, unspecified: Secondary | ICD-10-CM

## 2022-06-09 DIAGNOSIS — Z125 Encounter for screening for malignant neoplasm of prostate: Secondary | ICD-10-CM | POA: Diagnosis not present

## 2022-06-09 DIAGNOSIS — N481 Balanitis: Secondary | ICD-10-CM | POA: Insufficient documentation

## 2022-06-09 DIAGNOSIS — N471 Phimosis: Secondary | ICD-10-CM | POA: Insufficient documentation

## 2022-06-09 DIAGNOSIS — R300 Dysuria: Secondary | ICD-10-CM

## 2022-06-09 DIAGNOSIS — Z0001 Encounter for general adult medical examination with abnormal findings: Secondary | ICD-10-CM | POA: Diagnosis not present

## 2022-06-09 DIAGNOSIS — R739 Hyperglycemia, unspecified: Secondary | ICD-10-CM

## 2022-06-09 LAB — HEPATIC FUNCTION PANEL
ALT: 15 U/L (ref 0–53)
AST: 15 U/L (ref 0–37)
Albumin: 4.4 g/dL (ref 3.5–5.2)
Alkaline Phosphatase: 70 U/L (ref 39–117)
Bilirubin, Direct: 0.1 mg/dL (ref 0.0–0.3)
Total Bilirubin: 0.4 mg/dL (ref 0.2–1.2)
Total Protein: 6.8 g/dL (ref 6.0–8.3)

## 2022-06-09 LAB — URINALYSIS, ROUTINE W REFLEX MICROSCOPIC
Bilirubin Urine: NEGATIVE
Ketones, ur: NEGATIVE
Leukocytes,Ua: NEGATIVE
Nitrite: NEGATIVE
Specific Gravity, Urine: 1.015 (ref 1.000–1.030)
Total Protein, Urine: NEGATIVE
Urine Glucose: NEGATIVE
Urobilinogen, UA: 0.2 (ref 0.0–1.0)
pH: 5.5 (ref 5.0–8.0)

## 2022-06-09 LAB — CBC WITH DIFFERENTIAL/PLATELET
Basophils Absolute: 0.1 10*3/uL (ref 0.0–0.1)
Basophils Relative: 0.9 % (ref 0.0–3.0)
Eosinophils Absolute: 0.4 10*3/uL (ref 0.0–0.7)
Eosinophils Relative: 6.4 % — ABNORMAL HIGH (ref 0.0–5.0)
HCT: 49.6 % (ref 39.0–52.0)
Hemoglobin: 16.5 g/dL (ref 13.0–17.0)
Lymphocytes Relative: 36.1 % (ref 12.0–46.0)
Lymphs Abs: 2.1 10*3/uL (ref 0.7–4.0)
MCHC: 33.3 g/dL (ref 30.0–36.0)
MCV: 94.1 fl (ref 78.0–100.0)
Monocytes Absolute: 0.5 10*3/uL (ref 0.1–1.0)
Monocytes Relative: 9.1 % (ref 3.0–12.0)
Neutro Abs: 2.8 10*3/uL (ref 1.4–7.7)
Neutrophils Relative %: 47.5 % (ref 43.0–77.0)
Platelets: 215 10*3/uL (ref 150.0–400.0)
RBC: 5.26 Mil/uL (ref 4.22–5.81)
RDW: 14 % (ref 11.5–15.5)
WBC: 5.9 10*3/uL (ref 4.0–10.5)

## 2022-06-09 LAB — LIPID PANEL
Cholesterol: 169 mg/dL (ref 0–200)
HDL: 47.2 mg/dL (ref >39.00)
LDL Cholesterol: 109 mg/dL — ABNORMAL HIGH (ref 0–99)
NonHDL: 121.77
Total CHOL/HDL Ratio: 4
Triglycerides: 63 mg/dL (ref 0.0–149.0)
VLDL: 12.6 mg/dL (ref 0.0–40.0)

## 2022-06-09 LAB — VITAMIN B12: Vitamin B-12: 541 pg/mL (ref 211–911)

## 2022-06-09 LAB — VITAMIN D 25 HYDROXY (VIT D DEFICIENCY, FRACTURES): VITD: 24.04 ng/mL — ABNORMAL LOW (ref 30.00–100.00)

## 2022-06-09 LAB — BASIC METABOLIC PANEL
BUN: 17 mg/dL (ref 6–23)
CO2: 29 mEq/L (ref 19–32)
Calcium: 10 mg/dL (ref 8.4–10.5)
Chloride: 103 mEq/L (ref 96–112)
Creatinine, Ser: 0.78 mg/dL (ref 0.40–1.50)
GFR: 104.53 mL/min (ref >60.00)
Glucose, Bld: 96 mg/dL (ref 70–99)
Potassium: 4.5 mEq/L (ref 3.5–5.1)
Sodium: 140 mEq/L (ref 135–145)

## 2022-06-09 LAB — TSH: TSH: 1.03 u[IU]/mL (ref 0.35–5.50)

## 2022-06-09 LAB — PSA: PSA: 0.94 ng/mL (ref 0.10–4.00)

## 2022-06-09 LAB — HEMOGLOBIN A1C: Hgb A1c MFr Bld: 5.4 % (ref 4.6–6.5)

## 2022-06-09 MED ORDER — KETOCONAZOLE 2 % EX CREA: 1.0000 | TOPICAL_CREAM | Freq: Every day | CUTANEOUS | 0 refills | Status: DC

## 2022-06-09 MED ORDER — CIPROFLOXACIN HCL 500 MG PO TABS: 500.0000 mg | ORAL_TABLET | Freq: Two times a day (BID) | ORAL | 0 refills | Status: DC

## 2022-06-09 NOTE — Assessment & Plan Note (Signed)
Mild to mod, for topical ketoconozole cr asd,  to f/u any worsening symptoms or concerns

## 2022-06-09 NOTE — Patient Instructions (Addendum)
Please take all new medication as prescribed - the pill antibiotic, and the cream as directed  Please continue all other medications as before, and refills have been done if requested.  Please have the pharmacy call with any other refills you may need.  Please continue your efforts at being more active, low cholesterol diet, and weight control.  You are otherwise up to date with prevention measures today.  Please keep your appointments with your specialists as you may have planned - Dr Vilinda Boehringer will be contacted regarding the referral for: Urology  Please go to the LAB at the blood drawing area for the tests to be done  You will be contacted by phone if any changes need to be made immediately.  Otherwise, you will receive a letter about your results with an explanation, but please check with MyChart first.  Please remember to sign up for MyChart if you have not done so, as this will be important to you in the future with finding out test results, communicating by private email, and scheduling acute appointments online when needed.  Please make an Appointment to return in 6 months, or sooner if needed

## 2022-06-09 NOTE — Assessment & Plan Note (Signed)
Lab Results  Component Value Date   LDLCALC 109 (H) 06/09/2022   Mild uncontrolled, goal ldl < 70 with hx of CAD on CT scan, for lower chol diet, declines statin for now       Current Outpatient Medications (Other):    Cholecalciferol (VITAMIN D-3) 125 MCG (5000 UT) TABS, Take 5,000 Units by mouth daily.   ciprofloxacin (CIPRO) 500 MG tablet, Take 1 tablet (500 mg total) by mouth 2 (two) times daily for 10 days.   Creatine POWD, Take 5 mg by mouth daily.   diphenhydramine-acetaminophen (TYLENOL PM) 25-500 MG TABS tablet, Take 2 tablets by mouth at bedtime as needed (sleep).   ketoconazole (NIZORAL) 2 % cream, Apply 1 Application topically daily.   lamoTRIgine (LAMICTAL) 100 MG tablet, Take 1 tablet (100 mg total) by mouth daily.   Multiple Vitamins-Minerals (MULTIVITAMIN WITH MINERALS) tablet, Take 1 tablet by mouth daily.

## 2022-06-09 NOTE — Assessment & Plan Note (Signed)
Age and sex appropriate education and counseling updated with regular exercise and diet Referrals for preventative services - none needed Immunizations addressed - declines flu shot Smoking counseling  - none needed Evidence for depression or other mood disorder - none significant Most recent labs reviewed. I have personally reviewed and have noted: 1) the patient's medical and social history 2) The patient's current medications and supplements 3) The patient's height, weight, and BMI have been recorded in the chart  

## 2022-06-09 NOTE — Assessment & Plan Note (Signed)
Lab Results  Component Value Date   HGBA1C 5.4 06/09/2022   Stable, pt to continue current medical treatment  - diet, wt control

## 2022-06-09 NOTE — Progress Notes (Signed)
Patient ID: Carlos Tapia, male   DOB: Jul 25, 1970, 52 y.o.   MRN: [id number]         Chief Complaint:: wellness exam and Urinary Tract Infection (Requesting referral to urology)  With phimosis and balanitis, hld , hyperglycemia       HPI:  Carlos Tapia is a 52 y.o. male here for wellness exam; declines flu shot o/w up to date                        Also ? UTI with recent onset dysuria x 3 days, Denies urinary symptoms such as frequency, urgency, flank pain, hematuria or n/v, fever, chills.  But also has very severe phimosis essentially unable to retract, as well as glans penis redness and small swelling.  Pt denies chest pain, increased sob or doe, wheezing, orthopnea, PND, increased LE swelling, palpitations, dizziness or syncope.   Pt denies polydipsia, polyuria, or new focal neuro s/s.    Pt denies fever, wt loss, night sweats, loss of appetite, or other constitutional symptoms    Has complicated recent hx with now s/p [facility] repair with syncope and sinus infection after, has known small umbilical and RIH but no sugury needed.  ON STD now, hoping for back to work in 10 days.   Wt Readings from Last 3 Encounters:  06/09/22 237 lb (107.5 kg)  05/15/22 229 lb (103.9 kg)  05/08/22 229 lb (103.9 kg)   BP Readings from Last 3 Encounters:  06/09/22 124/76  05/15/22 (!) 140/84  05/08/22 (!) 157/100   Immunization History  Administered Date(s) Administered   PFIZER(Purple Top)SARS-COV-2 Vaccination 06/12/2019, 11/05/2019   Tdap 06/28/2015  There are no preventive care reminders to display for this patient.    Past Medical History:  Diagnosis Date   Anxiety    Arthritis    Blood in stool    Chicken pox    Coronary artery disease    Depression    GERD (gastroesophageal reflux disease)    Migraines    Shingles 2009, 2010   Past Surgical History:  Procedure Laterality Date   HERNIA REPAIR     SHOULDER ARTHROSCOPY W/ ROTATOR CUFF REPAIR Right     reports that he quit smoking about  6 years ago. His smoking use included cigarettes. He has a 22.50 pack-year smoking history. He has never used smokeless tobacco. He reports that he does not currently use alcohol. He reports current drug use. Frequency: 7.00 times per week. Drug: Marijuana. family history includes Heart disease in his mother. He was adopted. No Known Allergies Current Outpatient Medications on File Prior to Visit  Medication Sig Dispense Refill   Cholecalciferol (VITAMIN D-3) 125 MCG (5000 UT) TABS Take 5,000 Units by mouth daily.     Creatine POWD Take 5 mg by mouth daily.     diphenhydramine-acetaminophen (TYLENOL PM) 25-500 MG TABS tablet Take 2 tablets by mouth at bedtime as needed (sleep).     lamoTRIgine (LAMICTAL) 100 MG tablet Take 1 tablet (100 mg total) by mouth daily. 30 tablet 1   Multiple Vitamins-Minerals (MULTIVITAMIN WITH MINERALS) tablet Take 1 tablet by mouth daily.     No current facility-administered medications on file prior to visit.        ROS:  All others reviewed and negative.  Objective        PE:  BP 124/76   Pulse 65   Temp 98 F (36.7 C) (Oral)   Ht '5\' 10"'$  (  1.778 m)   Wt 237 lb (107.5 kg)   SpO2 94%   BMI 34.01 kg/m                 Constitutional: Pt appears in NAD               HENT: Head: NCAT.                Right Ear: External ear normal.                 Left Ear: External ear normal.                Eyes: . Pupils are equal, round, and reactive to light. Conjunctivae and EOM are normal               Nose: without d/c or deformity               Neck: Neck supple. Gross normal ROM               Cardiovascular: Normal rate and regular rhythm.                 Pulmonary/Chest: Effort normal and breath sounds without rales or wheezing.                Abd:  Soft, NT, ND, + BS, no organomegaly               GU: glans penis difficult to examine due to very severe phimosis essentially unable to retract but erythema and mild tender swelling noted, no d/c or drainage; no  flank tender               Neurological: Pt is alert. At baseline orientation, motor grossly intact               Skin: Skin is warm. No rashes, no other new lesions, LE edema - none               Psychiatric: Pt behavior is normal without agitation   Micro: none  Cardiac tracings I have personally interpreted today:  none  Pertinent Radiological findings (summarize): none   Lab Results  Component Value Date   WBC 5.9 06/09/2022   HGB 16.5 06/09/2022   HCT 49.6 06/09/2022   PLT 215.0 06/09/2022   GLUCOSE 96 06/09/2022   CHOL 169 06/09/2022   TRIG 63.0 06/09/2022   HDL 47.20 06/09/2022   LDLCALC 109 (H) 06/09/2022   ALT 15 06/09/2022   AST 15 06/09/2022   NA 140 06/09/2022   K 4.5 06/09/2022   CL 103 06/09/2022   CREATININE 0.78 06/09/2022   BUN 17 06/09/2022   CO2 29 06/09/2022   TSH 1.03 06/09/2022   PSA 0.94 06/09/2022   HGBA1C 5.4 06/09/2022   Assessment/Plan:  Carlos Tapia is a 52 y.o. White or Caucasian [1] male with  has a past medical history of Anxiety, Arthritis, Blood in stool, Chicken pox, Coronary artery disease, Depression, GERD (gastroesophageal reflux disease), Migraines, and Shingles (2009, 2010).  Encounter for well adult exam with abnormal findings Age and sex appropriate education and counseling updated with regular exercise and diet Referrals for preventative services - none needed Immunizations addressed - declines flu shot Smoking counseling  - none needed Evidence for depression or other mood disorder - none significant Most recent labs reviewed. I have personally reviewed and have noted: 1) the patient's medical and social history 2) The patient's current medications  and supplements 3) The patient's height, weight, and BMI have been recorded in the chart   Balanitis Mild to mod, for topical ketoconozole cr asd,  to f/u any worsening symptoms or concerns   Dysuria Possible uti - for ua and cx, empiric cipro 500 bid course,  to f/u any  worsening symptoms or concerns   HLD (hyperlipidemia) Lab Results  Component Value Date   LDLCALC 109 (H) 06/09/2022   Mild uncontrolled, goal ldl < 70 with hx of CAD on CT scan, for lower chol diet, declines statin for now       Current Outpatient Medications (Other):    Cholecalciferol (VITAMIN D-3) 125 MCG (5000 UT) TABS, Take 5,000 Units by mouth daily.   ciprofloxacin (CIPRO) 500 MG tablet, Take 1 tablet (500 mg total) by mouth 2 (two) times daily for 10 days.   Creatine POWD, Take 5 mg by mouth daily.   diphenhydramine-acetaminophen (TYLENOL PM) 25-500 MG TABS tablet, Take 2 tablets by mouth at bedtime as needed (sleep).   ketoconazole (NIZORAL) 2 % cream, Apply 1 Application topically daily.   lamoTRIgine (LAMICTAL) 100 MG tablet, Take 1 tablet (100 mg total) by mouth daily.   Multiple Vitamins-Minerals (MULTIVITAMIN WITH MINERALS) tablet, Take 1 tablet by mouth daily.   Hyperglycemia Lab Results  Component Value Date   HGBA1C 5.4 06/09/2022   Stable, pt to continue current medical treatment  - diet, wt control   Phimosis Very severe recent worsening, increasingly symptomatic, for urology referral  Followup: Return in about 6 months (around 12/10/2022).  Cathlean Cower, MD 06/09/2022 1:02 PM [facility]

## 2022-06-09 NOTE — Assessment & Plan Note (Signed)
Very severe recent worsening, increasingly symptomatic, for urology referral

## 2022-06-09 NOTE — Assessment & Plan Note (Signed)
Possible uti - for ua and cx, empiric cipro 500 bid course,  to f/u any worsening symptoms or concerns

## 2022-06-10 LAB — URINE CULTURE: Result:: NO GROWTH

## 2022-06-15 ENCOUNTER — Ambulatory Visit (INDEPENDENT_AMBULATORY_CARE_PROVIDER_SITE_OTHER): Payer: BC Managed Care – PPO | Admitting: Urology

## 2022-06-15 ENCOUNTER — Encounter: Payer: Self-pay | Admitting: Urology

## 2022-06-15 VITALS — BP 147/94 | HR 63 | Ht 70.0 in | Wt 235.0 lb

## 2022-06-15 DIAGNOSIS — R339 Retention of urine, unspecified: Secondary | ICD-10-CM | POA: Diagnosis not present

## 2022-06-15 DIAGNOSIS — R350 Frequency of micturition: Secondary | ICD-10-CM | POA: Diagnosis not present

## 2022-06-15 DIAGNOSIS — N419 Inflammatory disease of prostate, unspecified: Secondary | ICD-10-CM | POA: Insufficient documentation

## 2022-06-15 DIAGNOSIS — R3911 Hesitancy of micturition: Secondary | ICD-10-CM

## 2022-06-15 DIAGNOSIS — R3915 Urgency of urination: Secondary | ICD-10-CM

## 2022-06-15 DIAGNOSIS — R102 Pelvic and perineal pain: Secondary | ICD-10-CM

## 2022-06-15 DIAGNOSIS — N471 Phimosis: Secondary | ICD-10-CM | POA: Diagnosis not present

## 2022-06-15 DIAGNOSIS — R351 Nocturia: Secondary | ICD-10-CM

## 2022-06-15 DIAGNOSIS — R300 Dysuria: Secondary | ICD-10-CM

## 2022-06-15 LAB — URINALYSIS, ROUTINE W REFLEX MICROSCOPIC
Bilirubin, UA: NEGATIVE
Glucose, UA: NEGATIVE mg/dL
Ketones, UA: NEGATIVE
Leukocytes, UA: NEGATIVE
Nitrite, UA: NEGATIVE
Protein, UA: NEGATIVE
Spec Grav, UA: 1.02 (ref 1.010–1.025)
Urobilinogen, UA: 0.2 E.U./dL
WBC, Ur, HPF, POC: 0 (ref 0–5)
pH, UA: 7 (ref 5.0–8.0)

## 2022-06-15 LAB — BLADDER SCAN AMB NON-IMAGING

## 2022-06-15 MED ORDER — CIPROFLOXACIN HCL 500 MG PO TABS: 500.0000 mg | ORAL_TABLET | Freq: Two times a day (BID) | ORAL | 0 refills | Status: AC

## 2022-06-15 MED ORDER — ALFUZOSIN HCL ER 10 MG PO TB24: 10.0000 mg | ORAL_TABLET | Freq: Every day | ORAL | 3 refills | Status: DC

## 2022-06-15 NOTE — H&P (View-Only) (Signed)
 Assessment: 1. Prostatitis, unspecified prostatitis type   2. Phimosis   3. Incomplete bladder emptying     Plan: I personally reviewed the patient's chart including provider notes, and lab results. His exam and symptoms are consistent with prostatitis.   Recommend an extended course of Cipro for a total of 28 days.  Prescription sent. Recommend alfuzosin 10 mg daily for lower urinary tract symptoms. Management options for phimosis discussed.  Given the degree of phimosis, he would likely need a circumcision.  I would like for him to complete the treatment for prostatitis prior to scheduling surgery.  Circumcision procedure briefly discussed. Return to office in approximately 3-4 weeks.  Chief Complaint:  Chief Complaint  Patient presents with   Phimosis    History of Present Illness:  Carlos Tapia is a 51 y.o. male who is seen in consultation from John, James W, MD for evaluation of phimosis and lower urinary tract symptoms. He was not circumcised at birth.  He reports a problem with phimosis preventing retraction of his foreskin for a number of years.  He reports irritation of the foreskin primarily after intercourse.  He has a history of genital herpes which by report made the phimosis worse.  He has not seen any recent discharge. He has lower urinary tract symptoms including frequency, urgency, dysuria, nocturia 3-5 times, hesitancy, and sensation of incomplete emptying.  He also reports discomfort in the suprapubic area with the need to void.  No gross hematuria or flank pain. Urinalysis from 06/09/2022 showed 7-10 RBCs and rare bacteria. Urine culture showed no growth. PSA from 3/24: 0.94 He started on on Cipro 500 mg twice daily on 06/09/2022.  He reports some slight improvement in the dysuria. IPSS = 21 today  Past Medical History:  Past Medical History:  Diagnosis Date   Anxiety    Arthritis    Blood in stool    Chicken pox    Coronary artery disease    Depression     GERD (gastroesophageal reflux disease)    Migraines    Shingles 2009, 2010    Past Surgical History:  Past Surgical History:  Procedure Laterality Date   HERNIA REPAIR     SHOULDER ARTHROSCOPY W/ ROTATOR CUFF REPAIR Right     Allergies:  No Known Allergies  Family History:  Family History  Adopted: Yes  Problem Relation Age of Onset   Heart disease Mother    Colon cancer Neg Hx     Social History:  Social History   Tobacco Use   Smoking status: Former    Packs/day: 0.75    Years: 30.00    Additional pack years: 0.00    Total pack years: 22.50    Types: Cigarettes    Quit date: 03/30/2016    Years since quitting: 6.2   Smokeless tobacco: Never  Vaping Use   Vaping Use: Former   Substances: Flavoring  Substance Use Topics   Alcohol use: Not Currently    Comment: rarely   Drug use: Yes    Frequency: 7.0 times per week    Types: Marijuana    Review of symptoms:  Constitutional:  Negative for unexplained weight loss, night sweats, fever, chills ENT:  Negative for nose bleeds, sinus pain, painful swallowing CV:  Negative for chest pain, shortness of breath, exercise intolerance, palpitations, loss of consciousness Resp:  Negative for cough, wheezing, shortness of breath GI:  Negative for nausea, vomiting, diarrhea, bloody stools GU:  Positives noted in HPI; otherwise   negative for gross hematuria, urinary incontinence Neuro:  Negative for seizures, poor balance, limb weakness, slurred speech Psych:  Negative for lack of energy, depression, anxiety Endocrine:  Negative for polydipsia, polyuria, symptoms of hypoglycemia (dizziness, hunger, sweating) Hematologic:  Negative for anemia, purpura, petechia, prolonged or excessive bleeding, use of anticoagulants  Allergic:  Negative for difficulty breathing or choking as a result of exposure to anything; no shellfish allergy; no allergic response (rash/itch) to materials, foods  Physical exam: BP (!) 147/94   Pulse  63   Ht 5' 10" (1.778 m)   Wt 235 lb (106.6 kg)   BMI 33.72 kg/m  GENERAL APPEARANCE:  Well appearing, well developed, well nourished, NAD HEENT: Atraumatic, Normocephalic, oropharynx clear. NECK: Supple without lymphadenopathy or thyromegaly. LUNGS: Clear to auscultation bilaterally. HEART: Regular Rate and Rhythm without murmurs, gallops, or rubs. ABDOMEN: Soft, non-tender, No Masses. EXTREMITIES: Moves all extremities well.  Without clubbing, cyanosis, or edema. NEUROLOGIC:  Alert and oriented x 3, normal gait, CN II-XII grossly intact.  MENTAL STATUS:  Appropriate. BACK:  Non-tender to palpation.  No CVAT SKIN:  Warm, dry and intact.   GU: Penis:  phimosis, unable to retract foreskin, no significant erythema, no discharge Meatus: unable to visualize due to phimosis Scrotum: normal, no masses Testis: normal without masses bilateral Epididymis: normal Prostate: 30 g, tender, no nodules Rectum: Normal tone,  no masses or tenderness   Results: U/A:  0-5 WBC, 6-10 RBC, few bacteria  PVR:  160 ml 

## 2022-06-15 NOTE — Progress Notes (Signed)
Assessment: 1. Prostatitis, unspecified prostatitis type   2. Phimosis   3. Incomplete bladder emptying     Plan: I personally reviewed the patient's chart including provider notes, and lab results. His exam and symptoms are consistent with prostatitis.   Recommend an extended course of Cipro for a total of 28 days.  Prescription sent. Recommend alfuzosin 10 mg daily for lower urinary tract symptoms. Management options for phimosis discussed.  Given the degree of phimosis, he would likely need a circumcision.  I would like for him to complete the treatment for prostatitis prior to scheduling surgery.  Circumcision procedure briefly discussed. Return to office in approximately 3-4 weeks.  Chief Complaint:  Chief Complaint  Patient presents with   Phimosis    History of Present Illness:  Carlos Tapia is a 52 y.o. male who is seen in consultation from Biagio Borg, MD for evaluation of phimosis and lower urinary tract symptoms. He was not circumcised at birth.  He reports a problem with phimosis preventing retraction of his foreskin for a number of years.  He reports irritation of the foreskin primarily after intercourse.  He has a history of genital herpes which by report made the phimosis worse.  He has not seen any recent discharge. He has lower urinary tract symptoms including frequency, urgency, dysuria, nocturia 3-5 times, hesitancy, and sensation of incomplete emptying.  He also reports discomfort in the suprapubic area with the need to void.  No gross hematuria or flank pain. Urinalysis from 06/09/2022 showed 7-10 RBCs and rare bacteria. Urine culture showed no growth. PSA from 3/24: 0.94 He started on on Cipro 500 mg twice daily on 06/09/2022.  He reports some slight improvement in the dysuria. IPSS = 21 today  Past Medical History:  Past Medical History:  Diagnosis Date   Anxiety    Arthritis    Blood in stool    Chicken pox    Coronary artery disease    Depression     GERD (gastroesophageal reflux disease)    Migraines    Shingles 2009, 2010    Past Surgical History:  Past Surgical History:  Procedure Laterality Date   HERNIA REPAIR     SHOULDER ARTHROSCOPY W/ ROTATOR CUFF REPAIR Right     Allergies:  No Known Allergies  Family History:  Family History  Adopted: Yes  Problem Relation Age of Onset   Heart disease Mother    Colon cancer Neg Hx     Social History:  Social History   Tobacco Use   Smoking status: Former    Packs/day: 0.75    Years: 30.00    Additional pack years: 0.00    Total pack years: 22.50    Types: Cigarettes    Quit date: 03/30/2016    Years since quitting: 6.2   Smokeless tobacco: Never  Vaping Use   Vaping Use: Former   Substances: Flavoring  Substance Use Topics   Alcohol use: Not Currently    Comment: rarely   Drug use: Yes    Frequency: 7.0 times per week    Types: Marijuana    Review of symptoms:  Constitutional:  Negative for unexplained weight loss, night sweats, fever, chills ENT:  Negative for nose bleeds, sinus pain, painful swallowing CV:  Negative for chest pain, shortness of breath, exercise intolerance, palpitations, loss of consciousness Resp:  Negative for cough, wheezing, shortness of breath GI:  Negative for nausea, vomiting, diarrhea, bloody stools GU:  Positives noted in HPI; otherwise  negative for gross hematuria, urinary incontinence Neuro:  Negative for seizures, poor balance, limb weakness, slurred speech Psych:  Negative for lack of energy, depression, anxiety Endocrine:  Negative for polydipsia, polyuria, symptoms of hypoglycemia (dizziness, hunger, sweating) Hematologic:  Negative for anemia, purpura, petechia, prolonged or excessive bleeding, use of anticoagulants  Allergic:  Negative for difficulty breathing or choking as a result of exposure to anything; no shellfish allergy; no allergic response (rash/itch) to materials, foods  Physical exam: BP (!) 147/94   Pulse  63   Ht '5\' 10"'$  (1.778 m)   Wt 235 lb (106.6 kg)   BMI 33.72 kg/m  GENERAL APPEARANCE:  Well appearing, well developed, well nourished, NAD HEENT: Atraumatic, Normocephalic, oropharynx clear. NECK: Supple without lymphadenopathy or thyromegaly. LUNGS: Clear to auscultation bilaterally. HEART: Regular Rate and Rhythm without murmurs, gallops, or rubs. ABDOMEN: Soft, non-tender, No Masses. EXTREMITIES: Moves all extremities well.  Without clubbing, cyanosis, or edema. NEUROLOGIC:  Alert and oriented x 3, normal gait, CN II-XII grossly intact.  MENTAL STATUS:  Appropriate. BACK:  Non-tender to palpation.  No CVAT SKIN:  Warm, dry and intact.   GU: Penis:  phimosis, unable to retract foreskin, no significant erythema, no discharge Meatus: unable to visualize due to phimosis Scrotum: normal, no masses Testis: normal without masses bilateral Epididymis: normal Prostate: 30 g, tender, no nodules Rectum: Normal tone,  no masses or tenderness   Results: U/A:  0-5 WBC, 6-10 RBC, few bacteria  PVR:  160 ml

## 2022-07-05 ENCOUNTER — Encounter: Payer: Self-pay | Admitting: Urology

## 2022-07-05 ENCOUNTER — Ambulatory Visit (INDEPENDENT_AMBULATORY_CARE_PROVIDER_SITE_OTHER): Payer: BC Managed Care – PPO | Admitting: Urology

## 2022-07-05 ENCOUNTER — Encounter (HOSPITAL_BASED_OUTPATIENT_CLINIC_OR_DEPARTMENT_OTHER): Payer: Self-pay | Admitting: Urology

## 2022-07-05 ENCOUNTER — Ambulatory Visit: Payer: Self-pay | Admitting: Urology

## 2022-07-05 VITALS — BP 126/79 | HR 74 | Ht 70.0 in | Wt 240.0 lb

## 2022-07-05 DIAGNOSIS — R339 Retention of urine, unspecified: Secondary | ICD-10-CM

## 2022-07-05 DIAGNOSIS — N471 Phimosis: Secondary | ICD-10-CM

## 2022-07-05 DIAGNOSIS — N419 Inflammatory disease of prostate, unspecified: Secondary | ICD-10-CM | POA: Diagnosis not present

## 2022-07-05 LAB — URINALYSIS, ROUTINE W REFLEX MICROSCOPIC
Bilirubin, UA: NEGATIVE
Glucose, UA: NEGATIVE
Ketones, UA: NEGATIVE
Leukocytes,UA: NEGATIVE
Nitrite, UA: NEGATIVE
Protein,UA: NEGATIVE
Specific Gravity, UA: 1.015 (ref 1.005–1.030)
Urobilinogen, Ur: 0.2 mg/dL (ref 0.2–1.0)
pH, UA: 5.5 (ref 5.0–7.5)

## 2022-07-05 LAB — MICROSCOPIC EXAMINATION
Bacteria, UA: NONE SEEN
Cast Type: NONE SEEN
Casts: NONE SEEN /lpf
Crystal Type: NONE SEEN
Crystals: NONE SEEN
Epithelial Cells (non renal): NONE SEEN /hpf (ref 0–10)
Mucus, UA: NONE SEEN
Renal Epithel, UA: NONE SEEN /hpf
Trichomonas, UA: NONE SEEN
Yeast, UA: NONE SEEN

## 2022-07-05 NOTE — Progress Notes (Signed)
Assessment: 1. Phimosis   2. Prostatitis, unspecified prostatitis type   3. Incomplete bladder emptying     Plan: Recommend continuing alfuzosin 10 mg daily for his lower urinary tract symptoms. Surgical management of phimosis with circumcision discussed.  The procedure including potential risk of infection, bleeding, change in sensation, and anesthesia risk reviewed.  Procedure: The patient will be scheduled for circumcision at [facility].  Surgical request is placed with the surgery schedulers and will be scheduled at the patient's/family request. Informed consent is given as documented below. Anesthesia: General  The patient does not have sleep apnea, history of MRSA, history of VRE, history of cardiac device requiring special anesthetic needs. Patient is stable and considered clear for surgical in an outpatient ambulatory surgery setting as well as patient hospital setting.  Consent for Operation or Procedure: Provider Certification I hereby certify that the nature, purpose, benefits, usual and most frequent risks of, and alternatives to, the operation or procedure have been explained to the patient (or person authorized to sign for the patient) either by me as responsible physician or by the provider who is to perform the operation or procedure. Time spent such that the patient/family has had an opportunity to ask questions, and that those questions have been answered. The patient or the patient's representative has been advised that selected tasks may be performed by assistants to the primary health care provider(s). I believe that the patient (or person authorized to sign for the patient) understands what has been explained, and has consented to the operation or procedure. No guarantees were implied or made.   Chief Complaint:  Chief Complaint  Patient presents with   Prostatitis   phimosis    History of Present Illness:  Carlos Tapia is a 52 y.o. male who is  seen for further evaluation of phimosis, prostatitis, and lower urinary tract symptoms. He was not circumcised at birth.  He reports a problem with phimosis preventing retraction of his foreskin for a number of years.  He reports irritation of the foreskin primarily after intercourse.  He has a history of genital herpes which by report made the phimosis worse.  He has not seen any recent discharge. He had lower urinary tract symptoms including frequency, urgency, dysuria, nocturia 3-5 times, hesitancy, and sensation of incomplete emptying.  He also reported discomfort in the suprapubic area with the need to void.  No gross hematuria or flank pain. Urinalysis from 06/09/2022 showed 7-10 RBCs and rare bacteria. Urine culture showed no growth. PSA from 3/24: 0.94 He started on on Cipro 500 mg twice daily on 06/09/2022.  He reports some slight improvement in the dysuria. IPSS = 21.  His exam on 06/15/2022 was consistent with prostatitis.  He was started on alfuzosin 10 mg daily and continued on Cipro for a full 28 days.  He returns today for follow-up.  He reports improvement in his urinary symptoms after completing the Cipro.  He also noted improvement with alfuzosin.  He had an improved stream and felt like he was emptying his bladder more completely.  He discontinued the alfuzosin recently.  No dysuria or gross hematuria. IPSS = 11 today. He continues to have problems with his phimosis.  He would like to proceed with a circumcision in the near future.  Portions of the above documentation were copied from a prior visit for review purposes only.   Past Medical History:  Past Medical History:  Diagnosis Date   Anxiety    Arthritis  Blood in stool    Chicken pox    Coronary artery disease    Depression    GERD (gastroesophageal reflux disease)    Migraines    Shingles 2009, 2010    Past Surgical History:  Past Surgical History:  Procedure Laterality Date   HERNIA REPAIR     SHOULDER  ARTHROSCOPY W/ ROTATOR CUFF REPAIR Right     Allergies:  No Known Allergies  Family History:  Family History  Adopted: Yes  Problem Relation Age of Onset   Heart disease Mother    Colon cancer Neg Hx     Social History:  Social History   Tobacco Use   Smoking status: Former    Packs/day: 0.75    Years: 30.00    Additional pack years: 0.00    Total pack years: 22.50    Types: Cigarettes    Quit date: 03/30/2016    Years since quitting: 6.2   Smokeless tobacco: Never  Vaping Use   Vaping Use: Former   Substances: Flavoring  Substance Use Topics   Alcohol use: Not Currently    Comment: rarely   Drug use: Yes    Frequency: 7.0 times per week    Types: Marijuana    ROS: Constitutional:  Negative for fever, chills, weight loss CV: Negative for chest pain, previous MI, hypertension Respiratory:  Negative for shortness of breath, wheezing, sleep apnea, frequent cough GI:  Negative for nausea, vomiting, bloody stool, GERD  Physical exam: BP 126/79   Pulse 74   Ht 5\' 10"  (1.778 m)   Wt 240 lb (108.9 kg)   BMI 34.44 kg/m  GENERAL APPEARANCE:  Well appearing, well developed, well nourished, NAD HEENT:  Atraumatic, normocephalic, oropharynx clear NECK:  Supple without lymphadenopathy or thyromegaly ABDOMEN:  Soft, non-tender, no masses EXTREMITIES:  Moves all extremities well, without clubbing, cyanosis, or edema NEUROLOGIC:  Alert and oriented x 3, normal gait, CN II-XII grossly intact MENTAL STATUS:  appropriate BACK:  Non-tender to palpation, No CVAT SKIN:  Warm, dry, and intact GU: Penis:  phimosis; no balanitis; unable to visualize glans or meatus Meatus: not seen Scrotum: normal, no masses Testis: normal without masses bilateral   Results: U/A:  0-5 WBC, 0-2 RBC

## 2022-07-06 ENCOUNTER — Encounter (HOSPITAL_BASED_OUTPATIENT_CLINIC_OR_DEPARTMENT_OTHER): Payer: Self-pay | Admitting: Urology

## 2022-07-06 NOTE — Progress Notes (Signed)
Spoke w/ via phone for pre-op interview--- pt Lab needs dos----  no             Lab results------ no COVID test -----patient states asymptomatic no test needed Arrive at ------- 1215 on 07-11-2022 NPO after MN NO Solid Food.  Clear liquids from MN until--- 1115 Med rec completed Medications to take morning of surgery ----- alfuzosin Diabetic medication ----- n/a Patient instructed no nail polish to be worn day of surgery Patient instructed to bring photo id and insurance card day of surgery Patient aware to have Driver (ride ) / caregiver    for 24 hours after surgery -- wife, tiffany Patient Special Instructions ----- n/a Pre-Op special Istructions ----- n/a Patient verbalized understanding of instructions that were given at this phone interview. Patient denies shortness of breath, chest pain, fever, cough at this phone interview.

## 2022-07-10 ENCOUNTER — Encounter (HOSPITAL_COMMUNITY): Payer: Self-pay

## 2022-07-10 ENCOUNTER — Telehealth (HOSPITAL_COMMUNITY): Payer: BC Managed Care – PPO | Admitting: Psychiatry

## 2022-07-11 ENCOUNTER — Ambulatory Visit (HOSPITAL_BASED_OUTPATIENT_CLINIC_OR_DEPARTMENT_OTHER): Payer: BC Managed Care – PPO | Admitting: Anesthesiology

## 2022-07-11 ENCOUNTER — Encounter (HOSPITAL_BASED_OUTPATIENT_CLINIC_OR_DEPARTMENT_OTHER): Payer: Self-pay | Admitting: Urology

## 2022-07-11 ENCOUNTER — Other Ambulatory Visit: Payer: Self-pay

## 2022-07-11 ENCOUNTER — Encounter (HOSPITAL_BASED_OUTPATIENT_CLINIC_OR_DEPARTMENT_OTHER)
Admission: RE | Disposition: A | Discharge status: Home / Self Care | Payer: Self-pay | Source: Home / Self Care | Attending: Urology

## 2022-07-11 ENCOUNTER — Ambulatory Visit (HOSPITAL_BASED_OUTPATIENT_CLINIC_OR_DEPARTMENT_OTHER)
Admission: RE | Admit: 2022-07-11 | Discharge: 2022-07-11 | Disposition: A | Discharge status: Home / Self Care | Payer: BC Managed Care – PPO | Attending: Urology | Admitting: Urology

## 2022-07-11 DIAGNOSIS — I2510 Atherosclerotic heart disease of native coronary artery without angina pectoris: Secondary | ICD-10-CM | POA: Insufficient documentation

## 2022-07-11 DIAGNOSIS — K219 Gastro-esophageal reflux disease without esophagitis: Secondary | ICD-10-CM | POA: Diagnosis not present

## 2022-07-11 DIAGNOSIS — Z09 Encounter for follow-up examination after completed treatment for conditions other than malignant neoplasm: Secondary | ICD-10-CM | POA: Insufficient documentation

## 2022-07-11 DIAGNOSIS — N419 Inflammatory disease of prostate, unspecified: Secondary | ICD-10-CM | POA: Diagnosis not present

## 2022-07-11 DIAGNOSIS — N477 Other inflammatory diseases of prepuce: Secondary | ICD-10-CM | POA: Diagnosis not present

## 2022-07-11 DIAGNOSIS — Z01818 Encounter for other preprocedural examination: Secondary | ICD-10-CM

## 2022-07-11 DIAGNOSIS — N471 Phimosis: Secondary | ICD-10-CM | POA: Diagnosis not present

## 2022-07-11 DIAGNOSIS — Z87891 Personal history of nicotine dependence: Secondary | ICD-10-CM | POA: Diagnosis not present

## 2022-07-11 DIAGNOSIS — R339 Retention of urine, unspecified: Secondary | ICD-10-CM | POA: Diagnosis not present

## 2022-07-11 DIAGNOSIS — Z0289 Encounter for other administrative examinations: Secondary | ICD-10-CM

## 2022-07-11 DIAGNOSIS — B009 Herpesviral infection, unspecified: Secondary | ICD-10-CM | POA: Insufficient documentation

## 2022-07-11 HISTORY — DX: Injury, unspecified, initial encounter: T14.90XA

## 2022-07-11 HISTORY — DX: Inflammatory disease of prostate, unspecified: N41.9

## 2022-07-11 HISTORY — DX: Cannabis abuse, uncomplicated: F12.10

## 2022-07-11 HISTORY — DX: Bipolar disorder, unspecified: F31.9

## 2022-07-11 HISTORY — DX: Personal history of other diseases of the respiratory system: Z87.09

## 2022-07-11 HISTORY — DX: Vitamin D deficiency, unspecified: E55.9

## 2022-07-11 HISTORY — DX: Personal history of unspecified abuse in childhood: Z62.819

## 2022-07-11 HISTORY — DX: Phimosis: N47.1

## 2022-07-11 HISTORY — PX: CIRCUMCISION: SHX1350

## 2022-07-11 HISTORY — DX: Testicular hypofunction: E29.1

## 2022-07-11 HISTORY — DX: Encounter for adoption services: Z02.82

## 2022-07-11 HISTORY — DX: Unspecified symptoms and signs involving the genitourinary system: R39.9

## 2022-07-11 HISTORY — DX: Atherosclerotic heart disease of native coronary artery without angina pectoris: I25.10

## 2022-07-11 HISTORY — DX: Other specified health status: Z78.9

## 2022-07-11 SURGERY — CIRCUMCISION, ADULT
Anesthesia: General | Site: Penis

## 2022-07-11 MED ORDER — LIDOCAINE 2% (20 MG/ML) 5 ML SYRINGE
INTRAMUSCULAR | Status: DC | PRN
  Administered 2022-07-11: 40 mg via INTRAVENOUS

## 2022-07-11 MED ORDER — ONDANSETRON HCL 4 MG/2ML IJ SOLN
INTRAMUSCULAR | Status: AC
  Filled 2022-07-11: qty 2

## 2022-07-11 MED ORDER — ONDANSETRON HCL 4 MG/2ML IJ SOLN: 4.0000 mg | Freq: Once | INTRAMUSCULAR | Status: DC | PRN

## 2022-07-11 MED ORDER — ONDANSETRON HCL 4 MG/2ML IJ SOLN
INTRAMUSCULAR | Status: DC | PRN
  Administered 2022-07-11: 4 mg via INTRAVENOUS

## 2022-07-11 MED ORDER — DEXAMETHASONE SODIUM PHOSPHATE 10 MG/ML IJ SOLN
INTRAMUSCULAR | Status: AC
  Filled 2022-07-11: qty 1

## 2022-07-11 MED ORDER — DEXAMETHASONE SODIUM PHOSPHATE 10 MG/ML IJ SOLN
INTRAMUSCULAR | Status: DC | PRN
  Administered 2022-07-11: 10 mg via INTRAVENOUS

## 2022-07-11 MED ORDER — FENTANYL CITRATE (PF) 100 MCG/2ML IJ SOLN
INTRAMUSCULAR | Status: AC
  Filled 2022-07-11: qty 2

## 2022-07-11 MED ORDER — OXYCODONE HCL 5 MG/5ML PO SOLN: 5.0000 mg | Freq: Once | ORAL | Status: DC | PRN

## 2022-07-11 MED ORDER — PROPOFOL 10 MG/ML IV BOLUS
INTRAVENOUS | Status: AC
  Filled 2022-07-11: qty 20

## 2022-07-11 MED ORDER — PROPOFOL 10 MG/ML IV BOLUS
INTRAVENOUS | Status: DC | PRN
  Administered 2022-07-11: 200 mg via INTRAVENOUS

## 2022-07-11 MED ORDER — BACITRACIN ZINC 500 UNIT/GM EX OINT
TOPICAL_OINTMENT | CUTANEOUS | Status: DC | PRN
  Administered 2022-07-11: 1 via TOPICAL

## 2022-07-11 MED ORDER — LACTATED RINGERS IV SOLN
INTRAVENOUS | Status: DC
  Administered 2022-07-11: 1000 mL via INTRAVENOUS

## 2022-07-11 MED ORDER — FENTANYL CITRATE (PF) 250 MCG/5ML IJ SOLN
INTRAMUSCULAR | Status: DC | PRN
  Administered 2022-07-11: 50 ug via INTRAVENOUS
  Administered 2022-07-11 (×4): 25 ug via INTRAVENOUS

## 2022-07-11 MED ORDER — CEFAZOLIN SODIUM-DEXTROSE 2-4 GM/100ML-% IV SOLN
2.0000 g | INTRAVENOUS | Status: AC
  Administered 2022-07-11: 2 g via INTRAVENOUS

## 2022-07-11 MED ORDER — BUPIVACAINE HCL (PF) 0.25 % IJ SOLN
INTRAMUSCULAR | Status: DC | PRN
  Administered 2022-07-11: 7 mL

## 2022-07-11 MED ORDER — OXYCODONE HCL 5 MG PO TABS: 5.0000 mg | ORAL_TABLET | Freq: Once | ORAL | Status: DC | PRN

## 2022-07-11 MED ORDER — MIDAZOLAM HCL 2 MG/2ML IJ SOLN
INTRAMUSCULAR | Status: AC
  Filled 2022-07-11: qty 2

## 2022-07-11 MED ORDER — CEFAZOLIN SODIUM-DEXTROSE 2-4 GM/100ML-% IV SOLN
INTRAVENOUS | Status: AC
  Filled 2022-07-11: qty 100

## 2022-07-11 MED ORDER — FENTANYL CITRATE (PF) 100 MCG/2ML IJ SOLN: 25.0000 ug | INTRAMUSCULAR | Status: DC | PRN

## 2022-07-11 MED ORDER — DEXMEDETOMIDINE HCL IN NACL 80 MCG/20ML IV SOLN
INTRAVENOUS | Status: DC | PRN
  Administered 2022-07-11 (×2): 8 ug via BUCCAL

## 2022-07-11 MED ORDER — MIDAZOLAM HCL 2 MG/2ML IJ SOLN
INTRAMUSCULAR | Status: DC | PRN
  Administered 2022-07-11: 2 mg via INTRAVENOUS

## 2022-07-11 MED ORDER — 0.9 % SODIUM CHLORIDE (POUR BTL) OPTIME
TOPICAL | Status: DC | PRN
  Administered 2022-07-11: 500 mL

## 2022-07-11 MED ORDER — OXYCODONE HCL 5 MG PO TABS
5.0000 mg | ORAL_TABLET | Freq: Four times a day (QID) | ORAL | 0 refills | Status: DC | PRN
Start: 2022-07-11 — End: 2023-02-07

## 2022-07-11 SURGICAL SUPPLY — 35 items
BNDG CMPR 5X2 CHSV 1 LYR STRL (GAUZE/BANDAGES/DRESSINGS) ×1
BNDG CMPR 75X21 PLY HI ABS (MISCELLANEOUS) ×1
BNDG COHESIVE 2X5 TAN ST LF (GAUZE/BANDAGES/DRESSINGS) ×1 IMPLANT
COVER BACK TABLE 60X90IN (DRAPES) ×1 IMPLANT
COVER MAYO STAND STRL (DRAPES) ×1 IMPLANT
DRAPE LAPAROTOMY 100X72 PEDS (DRAPES) ×1 IMPLANT
ELECT NDL TIP 2.8 STRL (NEEDLE) ×1 IMPLANT
ELECT NEEDLE TIP 2.8 STRL (NEEDLE) ×1 IMPLANT
ELECT REM PT RETURN 9FT ADLT (ELECTROSURGICAL) ×1
ELECTRODE REM PT RTRN 9FT ADLT (ELECTROSURGICAL) ×1 IMPLANT
GAUZE STRETCH 2X75IN STRL (MISCELLANEOUS) ×1 IMPLANT
GAUZE XEROFORM 1X8 LF (GAUZE/BANDAGES/DRESSINGS) ×1 IMPLANT
GLOVE BIO SURGEON STRL SZ 6.5 (GLOVE) IMPLANT
GLOVE BIO SURGEON STRL SZ8 (GLOVE) ×1 IMPLANT
GLOVE BIOGEL PI IND STRL 7.0 (GLOVE) ×2 IMPLANT
GOWN STRL REUS W/ TWL LRG LVL3 (GOWN DISPOSABLE) IMPLANT
GOWN STRL REUS W/TWL LRG LVL3 (GOWN DISPOSABLE) ×1
GOWN STRL REUS W/TWL XL LVL3 (GOWN DISPOSABLE) ×1 IMPLANT
KIT TURNOVER CYSTO (KITS) ×1 IMPLANT
NDL HYPO 25X1 1.5 SAFETY (NEEDLE) ×1 IMPLANT
NEEDLE HYPO 25X1 1.5 SAFETY (NEEDLE) ×1 IMPLANT
NS IRRIG 500ML POUR BTL (IV SOLUTION) ×1 IMPLANT
PACK BASIN DAY SURGERY FS (CUSTOM PROCEDURE TRAY) ×1 IMPLANT
PAD ARMBOARD 7.5X6 YLW CONV (MISCELLANEOUS) ×1 IMPLANT
PENCIL SMOKE EVACUATOR (MISCELLANEOUS) IMPLANT
SLEEVE SCD COMPRESS KNEE MED (STOCKING) ×1 IMPLANT
SOL PREP POV-IOD 4OZ 10% (MISCELLANEOUS) IMPLANT
SPIKE FLUID TRANSFER (MISCELLANEOUS) ×1 IMPLANT
SUT CHROMIC 3 0 SH 27 (SUTURE) ×1 IMPLANT
SUT CHROMIC 4 0 PS 2 18 (SUTURE) ×1 IMPLANT
SUT VIC AB 4-0 SH 27 (SUTURE)
SUT VIC AB 4-0 SH 27XBRD (SUTURE) ×2 IMPLANT
SYR CONTROL 10ML LL (SYRINGE) ×1 IMPLANT
TOWEL OR 17X24 6PK STRL BLUE (TOWEL DISPOSABLE) IMPLANT
TRAY DSU PREP LF (CUSTOM PROCEDURE TRAY) IMPLANT

## 2022-07-11 NOTE — Anesthesia Procedure Notes (Signed)
Procedure Name: LMA Insertion Date/Time: 07/11/2022 2:15 PM  Performed by: Dairl Ponder, CRNAPre-anesthesia Checklist: Patient identified, Emergency Drugs available, Suction available and Patient being monitored Patient Re-evaluated:Patient Re-evaluated prior to induction Oxygen Delivery Method: Circle System Utilized Preoxygenation: Pre-oxygenation with 100% oxygen Induction Type: IV induction Ventilation: Mask ventilation without difficulty LMA: LMA inserted LMA Size: 4.0 Number of attempts: 1 Airway Equipment and Method: Bite block Placement Confirmation: positive ETCO2 Tube secured with: Tape Dental Injury: Teeth and Oropharynx as per pre-operative assessment

## 2022-07-11 NOTE — Anesthesia Preprocedure Evaluation (Addendum)
Anesthesia Evaluation  Patient identified by MRN, date of birth, ID band Patient awake    Reviewed: Allergy & Precautions, NPO status , Patient's Chart, lab work & pertinent test results  Airway Mallampati: I       Dental no notable dental hx. (+) Dental Advisory Given   Pulmonary asthma , sleep apnea and Continuous Positive Airway Pressure Ventilation , former smoker   Pulmonary exam normal breath sounds clear to auscultation       Cardiovascular + CAD  Normal cardiovascular exam Rhythm:Regular Rate:Normal     Neuro/Psych  Headaches PSYCHIATRIC DISORDERS Anxiety Depression Bipolar Disorder    Neuromuscular disease    GI/Hepatic Neg liver ROS,GERD  Medicated,,  Endo/Other  Obesity HLD  Renal/GU negative Renal ROS  negative genitourinary   Musculoskeletal  (+) Arthritis , Osteoarthritis,  Left and possible right Inguinal hernias   Abdominal  (+) + obese  Peds  Hematology negative hematology ROS (+)   Anesthesia Other Findings   Reproductive/Obstetrics Phimosis                              Anesthesia Physical Anesthesia Plan  ASA: 2  Anesthesia Plan: General   Post-op Pain Management: Minimal or no pain anticipated   Induction: Intravenous  PONV Risk Score and Plan: 3 and Treatment may vary due to age or medical condition, Scopolamine patch - Pre-op, Midazolam, Dexamethasone and Ondansetron  Airway Management Planned: LMA  Additional Equipment: None  Intra-op Plan:   Post-operative Plan: Extubation in OR  Informed Consent: I have reviewed the patients History and Physical, chart, labs and discussed the procedure including the risks, benefits and alternatives for the proposed anesthesia with the patient or authorized representative who has indicated his/her understanding and acceptance.     Dental advisory given  Plan Discussed with: Anesthesiologist and CRNA  Anesthesia  Plan Comments:          Anesthesia Quick Evaluation

## 2022-07-11 NOTE — Transfer of Care (Signed)
Immediate Anesthesia Transfer of Care Note  Patient: Carlos Tapia  Procedure(s) Performed: CIRCUMCISION ADULT (Penis)  Patient Location: PACU  Anesthesia Type:General  Level of Consciousness: awake, alert , and oriented  Airway & Oxygen Therapy: Patient Spontanous Breathing and Patient connected to nasal cannula oxygen  Post-op Assessment: Report given to RN and Post -op Vital signs reviewed and stable  Post vital signs: Reviewed and stable  Last Vitals:  Vitals Value Taken Time  BP    Temp    Pulse 76 07/11/22 1525  Resp 12 07/11/22 1525  SpO2 100 % 07/11/22 1525  Vitals shown include unvalidated device data.  Last Pain:  Vitals:   07/11/22 1214  PainSc: 0-No pain      Patients Stated Pain Goal: 6 (07/11/22 1214)  Complications: No notable events documented.

## 2022-07-11 NOTE — Discharge Instructions (Addendum)

## 2022-07-11 NOTE — Op Note (Signed)
OPERATIVE NOTE   Patient Name: Carlos Tapia  MRN: [id number]   Date of Procedure: 07/11/22   Preoperative diagnosis:  Phimosis  Postoperative diagnosis:  Phimosis  Procedure:  Circumcision  Attending: Milderd Meager, MD  Anesthesia: General with local  Estimated blood loss: 5 ml  Fluids: Per anesthesia record  Drains: None  Specimens: Foreskin  Antibiotics: Ancef 2 gm IV  Findings: Phimosis; normal glans and meatus  Indications:  52 year old male presents for surgical management of phimosis.  He was not circumcised at birth.  He has had problems with phimosis making it impossible for him to retract his foreskin for a number of years.  He has had irritation of the foreskin primarily after intercourse.  No recent episodes of balanitis.  He presents now for elective circumcision.  The procedure including potential risk including but not limited to skin, bleeding, change in sensation, and anesthetic complications has been discussed with the patient in detail.  He understands wishes to proceed as described.  Description of Procedure:  The patient received IV Ancef preoperatively.  After successful induction of a general anesthetic, the patient was placed on the operating room table in the supine position.  All pressure points were appropriately padded.  SCDs were placed.  Examination demonstrated a very tight phimosis which would allow only partial visualization of the distal glans and meatus.  The patient's genital area was prepped and draped in sterile fashion.  The outer preputial skin was marked at the level of the coronal sulcus.  A dorsal slit was performed to allow retraction of the foreskin over the glans.  Examination demonstrated a normal-appearing glans and a normal meatus.  The patient was noted to have a frenular attachment causing some ventral retraction of the glans with retraction of the foreskin.  This frenular treatment was divided allowing release of the  glans.  An incision was made on the inner preputial skin approximately 1 cm behind the coronal sulcus.  A similar incision was made on the outer preputial skin at the previously made mark.  The bridge of ending preputial skin was then removed with use of a cautery.  Hemostasis was obtained with the cautery.  The skin edges were then approximated using interrupted 3-0 chromic sutures.  Excellent hemostasis was noted.  4-0 chromic sutures were placed in the frenulum.  A penile block was performed using approximately 7 mL of 0.25% plain Marcaine.  A Xeroform gauze dressing was then applied over the incision.  A Curlex and Coban dressing were then applied.  The patient was then extubated and taken to the post anesthesia care unit in stable condition.  Complications: None  Condition: Stable, extubated, transferred to PACU  Plan:  Discharge to home Remove dressing in 48 hours Follow-up in 1 month.

## 2022-07-11 NOTE — Anesthesia Postprocedure Evaluation (Signed)
Anesthesia Post Note  Patient: Carlos Tapia  Procedure(s) Performed: CIRCUMCISION ADULT (Penis)     Patient location during evaluation: PACU Anesthesia Type: General Level of consciousness: awake and alert and oriented Pain management: pain level controlled Vital Signs Assessment: post-procedure vital signs reviewed and stable Respiratory status: spontaneous breathing, nonlabored ventilation and respiratory function stable Cardiovascular status: blood pressure returned to baseline and stable Postop Assessment: no apparent nausea or vomiting Anesthetic complications: no   No notable events documented.  Last Vitals:  Vitals:   07/11/22 1600 07/11/22 1615  BP: (!) 141/92 124/74  Pulse: (!) 55 (!) 53  Resp: 12 11  Temp:    SpO2: 97% 97%    Last Pain:  Vitals:   07/11/22 1600  PainSc: 2                  Satsuki Zillmer,Breckon A.

## 2022-07-11 NOTE — Interval H&P Note (Signed)
History and Physical Interval Note:  07/11/2022 1:52 PM  Carlos Tapia  has presented today for surgery, with the diagnosis of PHIMOSIS.  The various methods of treatment have been discussed with the patient and family. After consideration of risks, benefits and other options for treatment, the patient has consented to  Procedure(s): CIRCUMCISION ADULT (N/A) as a surgical intervention.  The patient's history has been reviewed, patient examined, no change in status, stable for surgery.  I have reviewed the patient's chart and labs.  Questions were answered to the patient's satisfaction.     Di Kindle

## 2022-07-12 LAB — SURGICAL PATHOLOGY

## 2022-07-13 ENCOUNTER — Encounter (HOSPITAL_BASED_OUTPATIENT_CLINIC_OR_DEPARTMENT_OTHER): Payer: Self-pay | Admitting: Urology

## 2022-07-19 ENCOUNTER — Telehealth: Payer: Self-pay | Admitting: Urology

## 2022-07-19 NOTE — Telephone Encounter (Signed)
Patients spouse called in regards to patient is requesting an antibiotic to be called in, stated that he is having some redness & oozing since his procedure.  Patients spouse, Elmarie Shiley, callback: [phone]   [facility] [address] [place], [place] - [address] Phone: [phone]  Fax: [phone]

## 2022-07-24 ENCOUNTER — Encounter: Payer: Self-pay | Admitting: Cardiovascular Disease

## 2022-07-24 NOTE — Progress Notes (Unsigned)
Cardiology Office Note:    Date:  07/24/2022   ID:  Carlos Tapia, DOB [birth year], MRN [id number]  PCP:  Corwin Levins, MD   [facility] Providers Cardiologist:  Willett Lefeber     Referring MD: Corwin Levins, MD   Chief Complaint  Patient presents with   coronary calcification      History of Present Illness:    Carlos Tapia is a 52 y.o. male with a hx of migraine headaches, bipolar disorder.  He was seen in the [facility] emergency department after having an episode of syncope.  Patient complained of severe abdominal pain starting in his rectum and groin.  He then became very hot and sweaty and passed out.  He was sitting at home when this occurred.  Denies any blood in stool or hematochezia.  His electrolytes were within normal limits.  Troponin levels were 3, 3 and were normal.  Hemoglobin is normal.  White blood cell count is normal.  Urinalysis was unremarkable. CT of the pelvis was negative for dissection of his aorta or iliac vessels. CT of the chest did reveal coronary artery calcifications.  He has frequent episodes of this pelvic pain.  Most of these/many of these are associated with a sensation of being hot and lightheaded.  He has passed out 3 separate times.  Every one of his episodes of syncope has been preceded by this pelvic/groin pain.  Is active   Lost 150 lbs over the past several years  On his own.  Has been exercising vigorously  ,  no CP , no syncope with his exercise ( cardio and weights )  Works in a [employer] which is very active  Quit smoking in 2018  Lipids look ok He is adopted - no idea about his family history   July 25, 2022 [name] is seen for follow up of his syncope      Past Medical History:  Diagnosis Date   Adopted    per unsure of family medial history   Anxiety    Arthritis    Bipolar 1 disorder    Coronary artery calcification    evaluted by cardiology-- dr Carney Harder   Depression    Hemorrhoids 2018   history gi  bleed s/p colonoscopy   History of asthma    per pt no longer issue  has not uses inhaler for yrs since wt loss   Hypogonadism male    Lower urinary tract symptoms (LUTS)    Migraines    Mild tetrahydrocannabinol (THC) abuse    Phimosis    Prostatitis    Trauma in childhood    Vitamin D deficiency     Past Surgical History:  Procedure Laterality Date   CIRCUMCISION N/A 07/11/2022   Procedure: CIRCUMCISION ADULT;  Surgeon: Milderd Meager., MD;  Location[facility];  Service: Urology;  Laterality: N/A;   COLONOSCOPY WITH PROPOFOL  01/05/2017   dr nandigam   ROBOT ASSISTED INGUINAL HERNIA REPAIR Left 05/08/2022   @ [facility]  by dr c. Fredricka Bonine   SHOULDER ARTHROSCOPY W/ ROTATOR CUFF REPAIR Right 06/2020    Current Medications: No outpatient medications have been marked as taking for the 07/25/22 encounter (Office Visit) with Katlen Seyer, Deloris Ping, MD.     Allergies:   Patient has no known allergies.   Social History   Socioeconomic History   Marital status: Married    Spouse name: Not on file   Number of children: 2  Years of education: 56   Highest education level: Not on file  Occupational History   Occupation: Location manager  Tobacco Use   Smoking status: Former    Packs/day: 0.75    Years: 30.00    Additional pack years: 0.00    Total pack years: 22.50    Types: Cigarettes    Quit date: 03/30/2016    Years since quitting: 6.3   Smokeless tobacco: Never  Vaping Use   Vaping Use: Former   Devices: quit 03/ 2018  Substance and Sexual Activity   Alcohol use: Not Currently    Comment: rarely   Drug use: Yes    Frequency: 7.0 times per week    Types: Marijuana    Comment: smoking   Sexual activity: Never    Birth control/protection: None  Other Topics Concern   Not on file  Social History Narrative   Fun: Video games   Denies religious beliefs effecting health care.    Social Determinants of Health   Financial Resource Strain: Not on file   Food Insecurity: Not on file  Transportation Needs: Not on file  Physical Activity: Not on file  Stress: Not on file  Social Connections: Not on file     Family History: The patient's family history includes Heart disease in his mother. There is no history of Colon cancer. He was adopted.  ROS:   Please see the history of present illness.     All other systems reviewed and are negative.  EKGs/Labs/Other Studies Reviewed:    The following studies were reviewed today:   EKG:    Recent Labs: 06/09/2022: ALT 15; BUN 17; Creatinine, Ser 0.78; Hemoglobin 16.5; Platelets 215.0; Potassium 4.5; Sodium 140; TSH 1.03  Recent Lipid Panel    Component Value Date/Time   CHOL 169 06/09/2022 0916   TRIG 63.0 06/09/2022 0916   HDL 47.20 06/09/2022 0916   CHOLHDL 4 06/09/2022 0916   VLDL 12.6 06/09/2022 0916   LDLCALC 109 (H) 06/09/2022 0916     Risk Assessment/Calculations:        Physical Exam:    Physical Exam: There were no vitals taken for this visit.  No BP recorded.  {Refresh Note OR Click here to enter BP  :1}***    GEN:  Well nourished, well developed in no acute distress HEENT: Normal NECK: No JVD; No carotid bruits LYMPHATICS: No lymphadenopathy CARDIAC: RRR ***, no murmurs, rubs, gallops RESPIRATORY:  Clear to auscultation without rales, wheezing or rhonchi  ABDOMEN: Soft, non-tender, non-distended MUSCULOSKELETAL:  No edema; No deformity  SKIN: Warm and dry NEUROLOGIC:  Alert and oriented x 3    ASSESSMENT:    No diagnosis found.  PLAN:      Syncope:    2.  Coronary artery calcifications:    3.  Pre op evaluation prior to hernia surgery     I will see him again in 3 months for follow-up office visit.          Medication Adjustments/Labs and Tests Ordered: Current medicines are reviewed at length with the patient today.  Concerns regarding medicines are outlined above.  No orders of the defined types were placed in this encounter.  No  orders of the defined types were placed in this encounter.    There are no Patient Instructions on file for this visit.   Signed, Kristeen Miss, MD  07/24/2022 8:35 PM    [facility]

## 2022-07-25 ENCOUNTER — Ambulatory Visit: Payer: BC Managed Care – PPO | Attending: Cardiovascular Disease | Admitting: Cardiovascular Disease

## 2022-07-25 DIAGNOSIS — N471 Phimosis: Secondary | ICD-10-CM

## 2022-08-01 ENCOUNTER — Other Ambulatory Visit: Payer: Self-pay | Admitting: Urology

## 2022-08-01 DIAGNOSIS — N419 Inflammatory disease of prostate, unspecified: Secondary | ICD-10-CM

## 2022-08-08 ENCOUNTER — Encounter: Payer: Self-pay | Admitting: Urology

## 2022-08-08 ENCOUNTER — Ambulatory Visit (INDEPENDENT_AMBULATORY_CARE_PROVIDER_SITE_OTHER): Payer: BC Managed Care – PPO | Admitting: Urology

## 2022-08-08 VITALS — BP 143/85 | HR 65 | Ht 69.5 in | Wt 240.0 lb

## 2022-08-08 DIAGNOSIS — N471 Phimosis: Secondary | ICD-10-CM

## 2022-08-08 DIAGNOSIS — R339 Retention of urine, unspecified: Secondary | ICD-10-CM

## 2022-08-08 LAB — MICROSCOPIC EXAMINATION
Cast Type: NONE SEEN
Casts: NONE SEEN /lpf
Crystal Type: NONE SEEN
Crystals: NONE SEEN
Epithelial Cells (non renal): NONE SEEN /hpf (ref 0–10)
Mucus, UA: NONE SEEN
Renal Epithel, UA: NONE SEEN /hpf
Trichomonas, UA: NONE SEEN
WBC, UA: NONE SEEN /hpf (ref 0–5)
Yeast, UA: NONE SEEN

## 2022-08-08 LAB — URINALYSIS, ROUTINE W REFLEX MICROSCOPIC
Bilirubin, UA: NEGATIVE
Glucose, UA: NEGATIVE
Ketones, UA: NEGATIVE
Leukocytes,UA: NEGATIVE
Nitrite, UA: NEGATIVE
Protein,UA: NEGATIVE
Specific Gravity, UA: 1.02 (ref 1.005–1.030)
Urobilinogen, Ur: 0.2 mg/dL (ref 0.2–1.0)
pH, UA: 5.5 (ref 5.0–7.5)

## 2022-08-08 MED ORDER — ALFUZOSIN HCL ER 10 MG PO TB24: 10.0000 mg | ORAL_TABLET | Freq: Every day | ORAL | 11 refills | Status: DC

## 2022-08-08 NOTE — Progress Notes (Signed)
Assessment: 1. Phimosis   2. Incomplete bladder emptying     Plan: Continue alfuzosin 10 mg daily for  lower urinary tract symptoms Local care to circumcision site Return to office in 3 months  Chief Complaint:  Chief Complaint  Patient presents with   phimosis    History of Present Illness:  Carlos Tapia is a 52 y.o. male who is seen for further evaluation of phimosis, history of prostatitis, and lower urinary tract symptoms. He was not circumcised at birth.  He reported a problem with phimosis preventing retraction of his foreskin for a number of years.  He reported irritation of the foreskin primarily after intercourse.  He has a history of genital herpes which by report made the phimosis worse.  He had lower urinary tract symptoms including frequency, urgency, dysuria, nocturia 3-5 times, hesitancy, and sensation of incomplete emptying.  He also reported discomfort in the suprapubic area with the need to void.  No gross hematuria or flank pain. Urinalysis from 06/09/2022 showed 7-10 RBCs and rare bacteria. Urine culture showed no growth. PSA from 3/24: 0.94 He started on on Cipro 500 mg twice daily on 06/09/2022.  He reports some slight improvement in the dysuria. IPSS = 21.  His exam on 06/15/2022 was consistent with prostatitis.  He was started on alfuzosin 10 mg daily and continued on Cipro for a full 28 days.  He noted improvement in his urinary symptoms after completing the Cipro.  He also noted improvement with alfuzosin.  He had an improved stream and felt like he was emptying his bladder more completely.  He discontinued the alfuzosin recently.  No dysuria or gross hematuria. IPSS = 11. He continued to have problems with his phimosis.    He underwent circumcision on 07/11/22.  Path consistent showed chronic inflammation consistent with phimosis. He returns today for postoperative follow-up.  He is doing fairly well.  The incision is healing nicely.  He is having some mild  tenderness in the area of the incision.  No drainage or erythema.  He continues on alfuzosin for his lower urinary tract symptoms.  No dysuria or gross hematuria. IPSS = 6 today.  Portions of the above documentation were copied from a prior visit for review purposes only.   Past Medical History:  Past Medical History:  Diagnosis Date   Adopted    per unsure of family medial history   Anxiety    Arthritis    Bipolar 1 disorder (HCC)    Coronary artery calcification    evaluted by cardiology-- dr Carney Harder   Depression    Hemorrhoids 2018   history gi bleed s/p colonoscopy   History of asthma    per pt no longer issue  has not uses inhaler for yrs since wt loss   Hypogonadism male    Lower urinary tract symptoms (LUTS)    Migraines    Mild tetrahydrocannabinol (THC) abuse    Phimosis    Prostatitis    Trauma in childhood    Vitamin D deficiency     Past Surgical History:  Past Surgical History:  Procedure Laterality Date   CIRCUMCISION N/A 07/11/2022   Procedure: CIRCUMCISION ADULT;  Surgeon: Milderd Meager., MD;  Location: [facility];  Service: Urology;  Laterality: N/A;   COLONOSCOPY WITH PROPOFOL  01/05/2017   dr nandigam   ROBOT ASSISTED INGUINAL HERNIA REPAIR Left 05/08/2022   @ [facility]  by dr c. Fredricka Bonine   SHOULDER ARTHROSCOPY W/ ROTATOR CUFF REPAIR  Right 06/2020    Allergies:  No Known Allergies  Family History:  Family History  Adopted: Yes  Problem Relation Age of Onset   Heart disease Mother    Colon cancer Neg Hx     Social History:  Social History   Tobacco Use   Smoking status: Former    Packs/day: 0.75    Years: 30.00    Additional pack years: 0.00    Total pack years: 22.50    Types: Cigarettes    Quit date: 03/30/2016    Years since quitting: 6.3   Smokeless tobacco: Never  Vaping Use   Vaping Use: Former   Devices: quit 03/ 2018  Substance Use Topics   Alcohol use: Not Currently    Comment: rarely   Drug use: Yes     Frequency: 7.0 times per week    Types: Marijuana    Comment: smoking    ROS: Constitutional:  Negative for fever, chills, weight loss CV: Negative for chest pain, previous MI, hypertension Respiratory:  Negative for shortness of breath, wheezing, sleep apnea, frequent cough GI:  Negative for nausea, vomiting, bloody stool, GERD  Physical exam: BP (!) 143/85   Pulse 65   Ht 5' 9.5" (1.765 m)   Wt 240 lb (108.9 kg)   BMI 34.93 kg/m  GENERAL APPEARANCE:  Well appearing, well developed, well nourished, NAD HEENT:  Atraumatic, normocephalic, oropharynx clear NECK:  Supple without lymphadenopathy or thyromegaly ABDOMEN:  Soft, non-tender, no masses EXTREMITIES:  Moves all extremities well, without clubbing, cyanosis, or edema NEUROLOGIC:  Alert and oriented x 3, normal gait, CN II-XII grossly intact MENTAL STATUS:  appropriate BACK:  Non-tender to palpation, No CVAT SKIN:  Warm, dry, and intact GU: Penis:  s/p circumcision, healing well, no erythema, minimal swelling of tissues Meatus: Normal Scrotum: normal, no masses   Results: U/A: 3-10 RBCs

## 2022-09-07 DIAGNOSIS — H9313 Tinnitus, bilateral: Secondary | ICD-10-CM | POA: Insufficient documentation

## 2022-09-07 DIAGNOSIS — J0191 Acute recurrent sinusitis, unspecified: Secondary | ICD-10-CM | POA: Diagnosis not present

## 2022-09-11 DIAGNOSIS — J0191 Acute recurrent sinusitis, unspecified: Secondary | ICD-10-CM | POA: Diagnosis not present

## 2022-09-11 DIAGNOSIS — J3489 Other specified disorders of nose and nasal sinuses: Secondary | ICD-10-CM | POA: Diagnosis not present

## 2022-09-12 DIAGNOSIS — R519 Headache, unspecified: Secondary | ICD-10-CM | POA: Insufficient documentation

## 2022-09-14 ENCOUNTER — Ambulatory Visit: Payer: BC Managed Care – PPO | Admitting: Nurse Practitioner

## 2022-09-14 ENCOUNTER — Encounter: Payer: Self-pay | Admitting: Neurology

## 2022-09-14 ENCOUNTER — Ambulatory Visit: Payer: BC Managed Care – PPO | Admitting: Neurology

## 2022-09-14 ENCOUNTER — Telehealth: Payer: Self-pay | Admitting: Neurology

## 2022-09-14 VITALS — BP 126/80 | HR 75 | Ht 69.0 in | Wt 252.0 lb

## 2022-09-14 DIAGNOSIS — G44221 Chronic tension-type headache, intractable: Secondary | ICD-10-CM

## 2022-09-14 MED ORDER — SUMATRIPTAN SUCCINATE 50 MG PO TABS: 50.0000 mg | ORAL_TABLET | ORAL | 6 refills | Status: DC | PRN

## 2022-09-14 MED ORDER — AMITRIPTYLINE HCL 25 MG PO TABS: 25.0000 mg | ORAL_TABLET | Freq: Every day | ORAL | 6 refills | Status: DC

## 2022-09-14 NOTE — Patient Instructions (Addendum)
MRI brain with and without contrast Start amitriptyline 25 mg nightly, can increase to 50 mg nightly if headaches not improve Use Imitrex as needed for headaches May also consider Tylenol or Aleve as needed for the headaches.  Follow-up in 6 months or sooner if worse. Re-establish care with behavioral health  You can also contact the [facility] specialist [phone] if you would like to be evaluated for Attention deficit disorder.

## 2022-09-14 NOTE — Progress Notes (Addendum)
[facility]  PATIENT: Carlos Tapia DOB: [birth year]  REQUESTING CLINICIAN: Tressia Miners, FNP HISTORY FROM: Patient  REASON FOR VISIT: Ongoing headaches    HISTORICAL  CHIEF COMPLAINT:  Chief Complaint  Patient presents with   New Patient (Initial Visit)    Rm 13, alone, NP for headaches pressure in head, off balance since hernia surgery in feb 2024 and been downhill. Worried about spinal menigitis    HISTORY OF PRESENT ILLNESS:  This is a 52 year old gentleman past medical history of Bipolar disorder, anxiety/Depression who is presenting with complaints of headaches since the month of February.  Patient reports his symptoms started last December when he was having multiple syncope, he was found to have hernia, had operation and since then has been feeling lethargic, confused and not back to his normal self.  Since February he has been complaining of headaches, reports that the headaches are currently constant, he feels a pressure-like pain in the front between his eyes and on the temple and sharp aching pain in the occipital region.  He initially thought he had a sinus infection, follow-up with ENT have sinus CTs and was told everything was normal and it was referred from ENT to neurology for further management of the headaches.  He reports as a child he did have migraines, where he will have to sit in a dark room.  He was not taking any preventive medication at that time.  Currently with his current headaches, he is not taking any over-the-counter medication.  Has not tried Tylenol or ibuprofen.   Headache History and Characteristics: Onset: February Location: Frontal and occipital headaches  Quality:  Pressure in the front, temple and aching pain in the back, sometimes, hard to lift his head Intensity: 3-9/10.  Duration: Pain is constant but will increase in intensity  Migrainous Features: Photophobia, nausea.  Aura: No  History of brain injury or tumor:  No  Family history: Unsure  Motion sickness: no Cardiac history: no  OTC: Excedrin Caffeine: yes  Sleep: 5 hrs a night  Mood/ Stress: High stress/irritable   Prior prophylaxis: Propranolol: No  Verapamil:No TCA: No Topamax: No Depakote: No Effexor: No Cymbalta: No Neurontin:No  Prior abortives: Triptan: No Anti-emetic: No Steroids: No Ergotamine suppository: No    OTHER MEDICAL CONDITIONS:  Bipolar disorder, anxiety/depression   REVIEW OF SYSTEMS: Full 14 system review of systems performed and negative with exception of: As noted in the HPI   ALLERGIES: No Known Allergies  HOME MEDICATIONS: Outpatient Medications Prior to Visit  Medication Sig Dispense Refill   Creatine POWD Take 5 mg by mouth daily.     Multiple Vitamins-Minerals (MULTIVITAMIN WITH MINERALS) tablet Take 1 tablet by mouth daily.     alfuzosin (UROXATRAL) 10 MG 24 hr tablet Take 1 tablet (10 mg total) by mouth daily. (Patient not taking: Reported on 09/14/2022) 30 tablet 11   Cholecalciferol (VITAMIN D-3) 125 MCG (5000 UT) TABS Take 5,000 Units by mouth daily. (Patient not taking: Reported on 09/14/2022)     diphenhydramine-acetaminophen (TYLENOL PM) 25-500 MG TABS tablet Take 2 tablets by mouth at bedtime as needed (sleep). (Patient not taking: Reported on 09/14/2022)     ketoconazole (NIZORAL) 2 % cream Apply 1 Application topically daily. (Patient not taking: Reported on 09/14/2022) 15 g 0   oxyCODONE (OXY IR/ROXICODONE) 5 MG immediate release tablet Take 1 tablet (5 mg total) by mouth every 6 (six) hours as needed for severe pain. (Patient not taking: Reported on 09/14/2022) 15 tablet  0   No facility-administered medications prior to visit.    PAST MEDICAL HISTORY: Past Medical History:  Diagnosis Date   Adopted    per unsure of family medial history   Anxiety    Arthritis    Bipolar 1 disorder (HCC)    Coronary artery calcification    evaluted by cardiology-- dr Carney Harder   Depression     Hemorrhoids 2018   history gi bleed s/p colonoscopy   History of asthma    per pt no longer issue  has not uses inhaler for yrs since wt loss   Hypogonadism male    Lower urinary tract symptoms (LUTS)    Migraines    Mild tetrahydrocannabinol (THC) abuse    Phimosis    Prostatitis    Trauma in childhood    Vitamin D deficiency     PAST SURGICAL HISTORY: Past Surgical History:  Procedure Laterality Date   CIRCUMCISION N/A 07/11/2022   Procedure: CIRCUMCISION ADULT;  Surgeon: Milderd Meager., MD;  Location: [facility];  Service: Urology;  Laterality: N/A;   COLONOSCOPY WITH PROPOFOL  01/05/2017   dr nandigam   ROBOT ASSISTED INGUINAL HERNIA REPAIR Left 05/08/2022   @ [facility]  by dr c. Fredricka Bonine   SHOULDER ARTHROSCOPY W/ ROTATOR CUFF REPAIR Right 06/2020    FAMILY HISTORY: Family History  Adopted: Yes  Problem Relation Age of Onset   Heart disease Mother    Colon cancer Neg Hx     SOCIAL HISTORY: Social History   Socioeconomic History   Marital status: Married    Spouse name: Not on file   Number of children: 2   Years of education: 12   Highest education level: Not on file  Occupational History   Occupation: Location manager  Tobacco Use   Smoking status: Former    Packs/day: 0.75    Years: 30.00    Additional pack years: 0.00    Total pack years: 22.50    Types: Cigarettes    Quit date: 03/30/2016    Years since quitting: 6.4   Smokeless tobacco: Never  Vaping Use   Vaping Use: Former   Devices: quit 03/ 2018  Substance and Sexual Activity   Alcohol use: Not Currently    Comment: rarely   Drug use: Yes    Frequency: 7.0 times per week    Types: Marijuana    Comment: smoking   Sexual activity: Yes    Birth control/protection: None  Other Topics Concern   Not on file  Social History Narrative   Fun: Video games   Denies religious beliefs effecting health care.    Right handed   Caffeine- a lot per patient   Social Determinants of  Health   Financial Resource Strain: Not on file  Food Insecurity: Not on file  Transportation Needs: Not on file  Physical Activity: Not on file  Stress: Not on file  Social Connections: Not on file  Intimate Partner Violence: Not on file    PHYSICAL EXAM   GENERAL EXAM/CONSTITUTIONAL: Vitals:  Vitals:   09/14/22 1039  BP: 126/80  Pulse: 75  SpO2: 95%  Weight: 252 lb (114.3 kg)  Height: 5\' 9"  (1.753 m)   Body mass index is 37.21 kg/m. Wt Readings from Last 3 Encounters:  09/14/22 252 lb (114.3 kg)  08/08/22 240 lb (108.9 kg)  07/11/22 247 lb 1.6 oz (112.1 kg)   Patient is in no distress; well developed, nourished and groomed; neck is supple  MUSCULOSKELETAL: Gait, strength, tone, movements noted in Neurologic exam below  NEUROLOGIC: MENTAL STATUS:      No data to display         awake, alert, oriented to person, place and time recent and remote memory intact normal attention and concentration language fluent, comprehension intact, naming intact fund of knowledge appropriate  CRANIAL NERVE:  2nd - no papilledema or hemorrhages on fundoscopic exam 2nd, 3rd, 4th, 6th - pupils equal and reactive to light, visual fields full to confrontation, extraocular muscles intact, no nystagmus 5th - facial sensation symmetric 7th - facial strength symmetric 8th - hearing intact 9th - palate elevates symmetrically, uvula midline 11th - shoulder shrug symmetric 12th - tongue protrusion midline  MOTOR:  normal bulk and tone, full strength in the BUE, BLE  SENSORY:  normal and symmetric to light touch, pinprick  COORDINATION:  finger-nose-finger, fine finger movements normal  GAIT/STATION:  normal   DIAGNOSTIC DATA (LABS, IMAGING, TESTING) - I reviewed patient records, labs, notes, testing and imaging myself where available.  Lab Results  Component Value Date   WBC 5.9 06/09/2022   HGB 16.5 06/09/2022   HCT 49.6 06/09/2022   MCV 94.1 06/09/2022   PLT  215.0 06/09/2022      Component Value Date/Time   NA 140 06/09/2022 0916   K 4.5 06/09/2022 0916   CL 103 06/09/2022 0916   CO2 29 06/09/2022 0916   GLUCOSE 96 06/09/2022 0916   BUN 17 06/09/2022 0916   CREATININE 0.78 06/09/2022 0916   CALCIUM 10.0 06/09/2022 0916   PROT 6.8 06/09/2022 0916   ALBUMIN 4.4 06/09/2022 0916   AST 15 06/09/2022 0916   ALT 15 06/09/2022 0916   ALKPHOS 70 06/09/2022 0916   BILITOT 0.4 06/09/2022 0916   GFRNONAA >60 04/25/2022 1726   Lab Results  Component Value Date   CHOL 169 06/09/2022   HDL 47.20 06/09/2022   LDLCALC 109 (H) 06/09/2022   TRIG 63.0 06/09/2022   CHOLHDL 4 06/09/2022   Lab Results  Component Value Date   HGBA1C 5.4 06/09/2022   Lab Results  Component Value Date   VITAMINB12 541 06/09/2022   Lab Results  Component Value Date   TSH 1.03 06/09/2022     ASSESSMENT AND PLAN  52 y.o. year old male with history of bipolar disorder, anxiety/depression who is presenting with complaint of headaches.  Patient reports headaches are constant since February, frontal and also occipital region.  Patient likely has chronic tension type headaches.  I will start by getting MRI of his brain to rule out intracranial pathology and also start him on amitriptyline as preventative and sumatriptan as abortive medication.  I also advised him to use Tylenol and Aleve as needed for the headaches but no more than 3 days a week.  I will contact him to go over the result otherwise I will see him in 6 months for follow-up    1. Chronic tension-type headache, intractable     Patient Instructions  MRI brain with and without contrast Start amitriptyline 25 mg nightly, can increase to 50 mg nightly if headaches not improve Use Imitrex as needed for headaches May also consider Tylenol or Aleve as needed for the headaches.  Follow-up in 6 months or sooner if worse. Re-establish care with behavioral health  You can also contact the [facility]  specialist [phone] if you would like to be evaluated for Attention deficit disorder.    Orders Placed This Encounter  Procedures  MR BRAIN W WO CONTRAST    Meds ordered this encounter  Medications   amitriptyline (ELAVIL) 25 MG tablet    Sig: Take 1 tablet (25 mg total) by mouth at bedtime.    Dispense:  30 tablet    Refill:  6   SUMAtriptan (IMITREX) 50 MG tablet    Sig: Take 1 tablet (50 mg total) by mouth every 2 (two) hours as needed for migraine. May repeat in 2 hours if headache persists or recurs.    Dispense:  10 tablet    Refill:  6    Return in about 6 months (around 03/16/2023).    Windell Norfolk, MD 09/14/2022, 11:14 AM  [facility] [address], [address] [place], [place] [address] [phone]

## 2022-09-14 NOTE — Telephone Encounter (Signed)
Pt scheduled for 45 mins MR brain w/wo contrast at [facility] for 09/26/22 at [facility] auth# [id number] (09/14/22-11/12/22)

## 2022-09-26 ENCOUNTER — Ambulatory Visit (INDEPENDENT_AMBULATORY_CARE_PROVIDER_SITE_OTHER): Payer: BC Managed Care – PPO

## 2022-09-26 DIAGNOSIS — G44221 Chronic tension-type headache, intractable: Secondary | ICD-10-CM

## 2022-09-26 MED ORDER — GADOBENATE DIMEGLUMINE 529 MG/ML IV SOLN
20.0000 mL | Freq: Once | INTRAVENOUS | Status: AC | PRN
  Administered 2022-09-26: 20 mL via INTRAVENOUS

## 2022-10-09 ENCOUNTER — Encounter (HOSPITAL_COMMUNITY): Payer: Self-pay | Admitting: Psychiatry

## 2022-10-09 ENCOUNTER — Telehealth (HOSPITAL_BASED_OUTPATIENT_CLINIC_OR_DEPARTMENT_OTHER): Payer: BC Managed Care – PPO | Admitting: Psychiatry

## 2022-10-09 VITALS — Wt 252.0 lb

## 2022-10-09 DIAGNOSIS — F419 Anxiety disorder, unspecified: Secondary | ICD-10-CM

## 2022-10-09 DIAGNOSIS — F1210 Cannabis abuse, uncomplicated: Secondary | ICD-10-CM | POA: Diagnosis not present

## 2022-10-09 DIAGNOSIS — F319 Bipolar disorder, unspecified: Secondary | ICD-10-CM

## 2022-10-09 DIAGNOSIS — T1490XA Injury, unspecified, initial encounter: Secondary | ICD-10-CM

## 2022-10-09 MED ORDER — ARIPIPRAZOLE 5 MG PO TABS
5.0000 mg | ORAL_TABLET | Freq: Every day | ORAL | 1 refills | Status: DC
Start: 2022-10-09 — End: 2022-12-13

## 2022-10-09 MED ORDER — GABAPENTIN 100 MG PO CAPS
100.0000 mg | ORAL_CAPSULE | Freq: Two times a day (BID) | ORAL | 1 refills | Status: DC
Start: 2022-10-09 — End: 2022-12-13

## 2022-10-09 NOTE — Progress Notes (Signed)
[facility] MD Virtual Progress Note   Patient Location: Work Provider Location: Home Office  I connect with patient by video and verified that I am speaking with correct person by using two identifiers. I discussed the limitations of evaluation and management by telemedicine and the availability of in person appointments. I also discussed with the patient that there may be a patient responsible charge related to this service. The patient expressed understanding and agreed to proceed.  Carlos Tapia [id number] 52 y.o.  10/09/2022 8:29 AM  History of Present Illness:  Patient is evaluated by video session.  He reported stopped taking the medication for a while because he had a lot of physical issues.  He had procedure for his bladder after he has circumscrion and developed a UTI.  He also had a migraine and saw neurology.  He was not sure what is going on with his body so he stopped taking most of his medication.  His neurologist recommended amitriptyline for headaches because he was also having dizziness but he stopped the amitriptyline after feeling better.  He reported his wife noticed that he was doing better on Lamictal but he was afraid if medicine is causing side effects including dizziness so he stopped.  He reported his symptoms are coming back and he is getting easily upset, angry, frustrated.  He lash out quickly.  He cut down his cannabis and he is only smoking rarely.  He is not sleeping well.  He is back to work and admitted having issues with a Radio broadcast assistant.  He recently finished antibiotic and no longer taking the pain medicine.  He feels very anxious and nervous around people.  He admitted having trust issues and for the same reason he has not started therapy.  He admitted feeling very nervous and anxious around people.  His migraines are finally getting better.  His appetite is okay and weight is unchanged from the past.  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  Past Psychiatric History: History of difficult childhood.  Involvement in gang crime and served jail time for 3 years and 3 years probation.  Never seen psychiatrist, counseling or any psychotropic medication.  No history of suicidal attempt.  History of mood swing, anger issues.  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  Smoked marijuana on a daily basis.  No history of DUI.Marland Kitchen  Hydroxyzine 10 mg but unclear if that cause insomnia.    Outpatient Encounter Medications as of 10/09/2022  Medication Sig   alfuzosin (UROXATRAL) 10 MG 24 hr tablet Take 1 tablet (10 mg total) by mouth daily. (Patient not taking: Reported on 09/14/2022)   amitriptyline (ELAVIL) 25 MG tablet Take 1 tablet (25 mg total) by mouth at bedtime.   Cholecalciferol (VITAMIN D-3) 125 MCG (5000 UT) TABS Take 5,000 Units by mouth daily. (Patient not taking: Reported on 09/14/2022)   Creatine POWD Take 5 mg by mouth daily.   diphenhydramine-acetaminophen (TYLENOL PM) 25-500 MG TABS tablet Take 2 tablets by mouth at bedtime as needed (sleep). (Patient not taking: Reported on 09/14/2022)   ketoconazole (NIZORAL) 2 % cream Apply 1 Application topically daily. (Patient not taking: Reported on 09/14/2022)   Multiple Vitamins-Minerals (MULTIVITAMIN WITH MINERALS) tablet Take 1 tablet by mouth daily.   oxyCODONE (OXY IR/ROXICODONE) 5 MG immediate release tablet Take 1 tablet (5 mg total) by mouth every 6 (six) hours as needed  for severe pain. (Patient not taking: Reported on 09/14/2022)   SUMAtriptan (IMITREX) 50 MG tablet Take 1 tablet (50 mg total) by mouth every 2 (two) hours as needed for migraine. May repeat in 2 hours if headache persists or recurs.   No facility-administered encounter medications on file as of 10/09/2022.    Recent Results (from the past 2160 hour(s))  Surgical pathology     Status: None   Collection Time: 07/11/22  2:38 PM  Result Value Ref  Range   SURGICAL PATHOLOGY      SURGICAL PATHOLOGY CASE: [id number] PATIENT: Carlos Tapia Surgical Pathology Report     Clinical History: Phimosis (crm)     FINAL MICROSCOPIC DIAGNOSIS:  A. FORESKIN, CIRCUMCISION: - Skin with underlying fibrosis of lamina propria and chronic inflammation, compatible with clinically stated phimosis      GROSS DESCRIPTION:  Received in formalin is a 6.3 cm in length, up to 2.2 cm in width and up to 0.3 cm thick irregular portion of tan-pink wrinkled skin with no discrete lesions.  Representative sections are submitted 1 block.  SW 07/11/2022   Final Diagnosis performed by Holley Bouche, MD.   Electronically signed 07/12/2022 Technical and / or Professional components performed at [facility], [address] [place], [place] [address].  Immunohistochemistry Technical component (if applicable) was performed at [facility]. [address], [place], [place] [address].   IMMUNOHISTOCHEMISTRY DISCLAIME R (if applicable): Some of these immunohistochemical stains may have been developed and the performance characteristics determine by [facility]. Some may not have been cleared or approved by the U.S. Food and Drug Administration. The FDA has determined that such clearance or approval is not necessary. This test is used for clinical purposes. It should not be regarded as investigational or for research. This laboratory is certified under the Clinical Laboratory Improvement Amendments of 1988 (CLIA-88) as qualified to perform high complexity clinical laboratory testing.  The controls stained appropriately.   Urinalysis, Routine w reflex microscopic     Status: Abnormal   Collection Time: 08/08/22  9:50 AM  Result Value Ref Range   Specific Gravity, UA 1.020 1.005 - 1.030   pH, UA 5.5 5.0 - 7.5   Color, UA Yellow Yellow   Appearance Ur Clear Clear   Leukocytes,UA Negative  Negative   Protein,UA Negative Negative/Trace   Glucose, UA Negative Negative   Ketones, UA Negative Negative   RBC, UA 2+ (A) Negative   Bilirubin, UA Negative Negative   Urobilinogen, Ur 0.2 0.2 - 1.0 mg/dL   Nitrite, UA Negative Negative   Microscopic Examination See below:   Microscopic Examination     Status: Abnormal   Collection Time: 08/08/22  9:50 AM   Urine  Result Value Ref Range   WBC, UA None seen 0 - 5 /hpf   RBC, Urine 3-10 (A) 0 - 2 /hpf   Epithelial Cells (non renal) None seen 0 - 10 /hpf   Renal Epithel, UA None seen None seen /hpf   Casts None seen None seen /lpf   Cast Type None seen N/A   Crystals None seen N/A   Crystal Type None seen N/A   Mucus, UA None seen Not Estab.   Bacteria, UA Moderate (A) None seen/Few   Yeast, UA None seen None seen   Trichomonas, UA None seen None seen     Psychiatric Specialty Exam: Physical Exam  Review of Systems  Weight 252 lb (114.3 kg).There is no  height or weight on file to calculate BMI.  General Appearance: Casual and beard.   Eye Contact:  Fair  Speech:  Pressured  Volume:  Normal  Mood:  Dysphoric and Irritable  Affect:  Labile  Thought Process:  Descriptions of Associations: Intact  Orientation:  Full (Time, Place, and Person)  Thought Content:  Rumination and trust issues  Suicidal Thoughts:  No  Homicidal Thoughts:  No  Memory:  Immediate;   Good Recent;   Good Remote;   Fair  Judgement:  Fair  Insight:  Present  Psychomotor Activity:  Increased  Concentration:  Concentration: Fair and Attention Span: Fair  Recall:  Good  Fund of Knowledge:  Fair  Language:  Good  Akathisia:  No  Handed:  Right  AIMS (if indicated):     Assets:  Communication Skills Desire for Improvement Housing Talents/Skills Transportation  ADL's:  Intact  Cognition:  WNL  Sleep:  poor     Assessment/Plan: Bipolar I disorder (HCC) - Plan: ARIPiprazole (ABILIFY) 5 MG tablet  Mild tetrahydrocannabinol (THC) abuse -  Plan: ARIPiprazole (ABILIFY) 5 MG tablet  Trauma in childhood - Plan: ARIPiprazole (ABILIFY) 5 MG tablet  Anxiety - Plan: ARIPiprazole (ABILIFY) 5 MG tablet, gabapentin (NEURONTIN) 100 MG capsule  Reviewed blood work results and recent visit to the hospital.  He is no longer taking narcotic pain medication and Lamictal.  Though he feels his wife noticed his mood was better with the Lamictal but he is not sure if that medicine cause dizziness.  He also had tried hydroxyzine that did not help him as much.  We have encouraged to see a therapist but patient is in the process of seeing a therapist that he can be comfortable.  He admitted a lot of anxiety and trust issues because of his past.  Recommend to try Abilify 5 mg but in the beginning he will take half tablet for a few days.  I also prescribed gabapentin 100 mg twice a day to help his anxiety.  Discussed medication side effects and benefits.  Encourage consider therapy to deal with his childhood trauma and anxiety.  He had cut down his cannabis significantly from the past.  Recommend to call us back with any question or any concern.  Follow-up in 1 month   Follow Up Instructions:     I discussed the assessment and treatment plan with the patient. The patient was provided an opportunity to ask questions and all were answered. The patient agreed with the plan and demonstrated an understanding of the instructions.   The patient was advised to call back or seek an in-person evaluation if the symptoms worsen or if the condition fails to improve as anticipated.    Collaboration of Care: Other provider involved in patient's care AEB notes are available in epic to review.  Patient/Guardian was advised Release of Information must be obtained prior to any record release in order to collaborate their care with an outside provider. Patient/Guardian was advised if they have not already done so to contact the registration department to sign all necessary  forms in order for [place] to release information regarding their care.   Consent: Patient/Guardian gives verbal consent for treatment and assignment of benefits for services provided during this visit. Patient/Guardian expressed understanding and agreed to proceed.     I provided 36 minutes of non face to face time during this encounter.  Note: This document was prepared by [employer] and any errors that results from  this process are unintentional.    Cleotis Nipper, MD 10/09/2022

## 2022-11-08 ENCOUNTER — Telehealth (HOSPITAL_COMMUNITY): Payer: BC Managed Care – PPO | Admitting: Psychiatry

## 2022-11-08 ENCOUNTER — Ambulatory Visit: Payer: BC Managed Care – PPO | Admitting: Urology

## 2022-11-14 ENCOUNTER — Telehealth (HOSPITAL_COMMUNITY): Payer: BC Managed Care – PPO | Admitting: Psychiatry

## 2022-11-16 ENCOUNTER — Encounter (INDEPENDENT_AMBULATORY_CARE_PROVIDER_SITE_OTHER): Payer: Self-pay

## 2022-11-16 DIAGNOSIS — Z79890 Hormone replacement therapy (postmenopausal): Secondary | ICD-10-CM | POA: Diagnosis not present

## 2022-11-16 DIAGNOSIS — E291 Testicular hypofunction: Secondary | ICD-10-CM | POA: Diagnosis not present

## 2022-11-16 DIAGNOSIS — R5383 Other fatigue: Secondary | ICD-10-CM | POA: Diagnosis not present

## 2022-11-23 DIAGNOSIS — Z6837 Body mass index (BMI) 37.0-37.9, adult: Secondary | ICD-10-CM | POA: Diagnosis not present

## 2022-11-23 DIAGNOSIS — E291 Testicular hypofunction: Secondary | ICD-10-CM | POA: Diagnosis not present

## 2022-11-23 DIAGNOSIS — M2550 Pain in unspecified joint: Secondary | ICD-10-CM | POA: Diagnosis not present

## 2022-11-23 DIAGNOSIS — R5383 Other fatigue: Secondary | ICD-10-CM | POA: Diagnosis not present

## 2022-11-28 ENCOUNTER — Telehealth: Payer: Self-pay | Admitting: Neurology

## 2022-11-28 NOTE — Telephone Encounter (Signed)
Unable to LVM, VM box full. Sent mychart msg informing pt of need to reschedule 03/26/23 appt - MD Out

## 2022-11-30 DIAGNOSIS — E291 Testicular hypofunction: Secondary | ICD-10-CM | POA: Diagnosis not present

## 2022-12-05 ENCOUNTER — Telehealth: Payer: Self-pay | Admitting: Internal Medicine

## 2022-12-05 NOTE — Telephone Encounter (Signed)
Placed on providers desk

## 2022-12-05 NOTE — Telephone Encounter (Signed)
Ok we'll be on the watch for those forms, thanks

## 2022-12-05 NOTE — Telephone Encounter (Signed)
Pt came in with paperwork that needs to filled out by his doctor and faxed once completed. He has an appointment coming up on Sept 9th 2024 as well so he can pick up copies of the forms the day of his visit.

## 2022-12-07 DIAGNOSIS — E291 Testicular hypofunction: Secondary | ICD-10-CM | POA: Diagnosis not present

## 2022-12-08 NOTE — Telephone Encounter (Signed)
Forms faxed

## 2022-12-11 ENCOUNTER — Telehealth: Payer: Self-pay | Admitting: Internal Medicine

## 2022-12-11 ENCOUNTER — Ambulatory Visit: Payer: BC Managed Care – PPO | Admitting: Internal Medicine

## 2022-12-11 NOTE — Telephone Encounter (Signed)
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

## 2022-12-12 ENCOUNTER — Other Ambulatory Visit (HOSPITAL_COMMUNITY): Payer: Self-pay | Admitting: Psychiatry

## 2022-12-12 DIAGNOSIS — F319 Bipolar disorder, unspecified: Secondary | ICD-10-CM

## 2022-12-12 DIAGNOSIS — T1490XA Injury, unspecified, initial encounter: Secondary | ICD-10-CM

## 2022-12-12 DIAGNOSIS — F1210 Cannabis abuse, uncomplicated: Secondary | ICD-10-CM

## 2022-12-12 DIAGNOSIS — F419 Anxiety disorder, unspecified: Secondary | ICD-10-CM

## 2022-12-13 ENCOUNTER — Other Ambulatory Visit (HOSPITAL_COMMUNITY): Payer: Self-pay | Admitting: *Deleted

## 2022-12-13 DIAGNOSIS — F319 Bipolar disorder, unspecified: Secondary | ICD-10-CM

## 2022-12-13 DIAGNOSIS — F419 Anxiety disorder, unspecified: Secondary | ICD-10-CM

## 2022-12-13 DIAGNOSIS — T1490XA Injury, unspecified, initial encounter: Secondary | ICD-10-CM

## 2022-12-13 DIAGNOSIS — F1210 Cannabis abuse, uncomplicated: Secondary | ICD-10-CM

## 2022-12-13 MED ORDER — GABAPENTIN 100 MG PO CAPS
100.0000 mg | ORAL_CAPSULE | Freq: Two times a day (BID) | ORAL | 1 refills | Status: DC
Start: 2022-12-13 — End: 2023-02-07

## 2022-12-13 MED ORDER — ARIPIPRAZOLE 5 MG PO TABS
5.0000 mg | ORAL_TABLET | Freq: Every day | ORAL | 1 refills | Status: DC
Start: 2022-12-13 — End: 2023-02-07

## 2022-12-13 NOTE — Telephone Encounter (Signed)
Forms have been refaxed  

## 2022-12-13 NOTE — Telephone Encounter (Signed)
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 In this clearance it needs to state that pt is cleared with no restrictions to return to work full time.

## 2022-12-14 DIAGNOSIS — E291 Testicular hypofunction: Secondary | ICD-10-CM | POA: Diagnosis not present

## 2022-12-15 ENCOUNTER — Encounter: Payer: Self-pay | Admitting: Internal Medicine

## 2022-12-15 NOTE — Telephone Encounter (Signed)
Called wife and let her know another note has been faxed.

## 2022-12-15 NOTE — Telephone Encounter (Signed)
Ok done hardcopy to [employer]

## 2022-12-21 DIAGNOSIS — E291 Testicular hypofunction: Secondary | ICD-10-CM | POA: Diagnosis not present

## 2022-12-21 DIAGNOSIS — Z79890 Hormone replacement therapy (postmenopausal): Secondary | ICD-10-CM | POA: Diagnosis not present

## 2022-12-28 DIAGNOSIS — Z6838 Body mass index (BMI) 38.0-38.9, adult: Secondary | ICD-10-CM | POA: Diagnosis not present

## 2022-12-28 DIAGNOSIS — E291 Testicular hypofunction: Secondary | ICD-10-CM | POA: Diagnosis not present

## 2022-12-28 DIAGNOSIS — R6882 Decreased libido: Secondary | ICD-10-CM | POA: Diagnosis not present

## 2023-01-04 DIAGNOSIS — E291 Testicular hypofunction: Secondary | ICD-10-CM | POA: Diagnosis not present

## 2023-01-11 DIAGNOSIS — E291 Testicular hypofunction: Secondary | ICD-10-CM | POA: Diagnosis not present

## 2023-01-18 DIAGNOSIS — E291 Testicular hypofunction: Secondary | ICD-10-CM | POA: Diagnosis not present

## 2023-01-26 DIAGNOSIS — E291 Testicular hypofunction: Secondary | ICD-10-CM | POA: Diagnosis not present

## 2023-02-01 DIAGNOSIS — E291 Testicular hypofunction: Secondary | ICD-10-CM | POA: Diagnosis not present

## 2023-02-07 ENCOUNTER — Encounter (HOSPITAL_COMMUNITY): Payer: Self-pay | Admitting: Psychiatry

## 2023-02-07 ENCOUNTER — Telehealth (HOSPITAL_COMMUNITY): Payer: BC Managed Care – PPO | Admitting: Psychiatry

## 2023-02-07 VITALS — Wt 250.0 lb

## 2023-02-07 DIAGNOSIS — F419 Anxiety disorder, unspecified: Secondary | ICD-10-CM | POA: Diagnosis not present

## 2023-02-07 DIAGNOSIS — F319 Bipolar disorder, unspecified: Secondary | ICD-10-CM | POA: Diagnosis not present

## 2023-02-07 DIAGNOSIS — T1490XA Injury, unspecified, initial encounter: Secondary | ICD-10-CM

## 2023-02-07 DIAGNOSIS — Z62819 Personal history of unspecified abuse in childhood: Secondary | ICD-10-CM | POA: Diagnosis not present

## 2023-02-07 DIAGNOSIS — F1210 Cannabis abuse, uncomplicated: Secondary | ICD-10-CM

## 2023-02-07 MED ORDER — ARIPIPRAZOLE 5 MG PO TABS: 5.0000 mg | ORAL_TABLET | Freq: Every day | ORAL | 2 refills | Status: DC

## 2023-02-07 MED ORDER — GABAPENTIN 100 MG PO CAPS
100.0000 mg | ORAL_CAPSULE | Freq: Two times a day (BID) | ORAL | 2 refills | Status: DC
Start: 2023-02-07 — End: 2023-05-07

## 2023-02-07 NOTE — Progress Notes (Signed)
[facility] MD Virtual Progress Note   Patient Location: In Car Provider Location: Home Office  I connect with patient by video and verified that I am speaking with correct person by using two identifiers. I discussed the limitations of evaluation and management by telemedicine and the availability of in person appointments. I also discussed with the patient that there may be a patient responsible charge related to this service. The patient expressed understanding and agreed to proceed.  Carlos Tapia [id number] 52 y.o.  02/07/2023 8:25 AM  History of Present Illness:  Patient is evaluated by video session.  He is taking Abilify 5 mg daily which is helping his mood, irritability and intrusive thoughts.  He reported sleep is improved and he does not fantasize himself and does not have imagination or has not noticed any scenario in his mind.  He feels anger is much better and he is back to his baseline.  He occasionally feels sometimes dizziness but realizes he is not as active due to his prolonged physical illness but now he is going to start exercise.  He lives with his wife who is very supportive.  Patient told he significantly cut down his cannabis use and his plan is to completely stop after the holidays.  He is excited and happy as his daughter is getting married next week.  He reported weight gain which he had at for past few months and he related to lack of activity but has plan to watch his caloric intake and exercise.  He is more calm.  He reported looking for another job since his mood is much better.  Denies any tremors, shakes, EPS.  He denies any hallucination, paranoia or any suicidal thoughts.  Denies any nightmares or flashback.  He had a history of difficult childhood.  He does not feel he need a therapist because he can talk to his wife about his issues who is very supportive.  Denies any homicidal thoughts or suicidal thoughts.  He like to keep his medication.  He is  no longer taking any pain medication.  He reported gabapentin 100 mg twice a day is helping his anxiety.  Past Psychiatric History: History of difficult childhood.  Involvement in gang crime and served jail time for 3 years and 3 years probation.  Never seen psychiatrist, counseling or any psychotropic medication.  No history of suicidal attempt.  History of mood swing, anger issues.  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  Smoked marijuana on a daily basis.  No history of DUI.Marland Kitchen  Hydroxyzine 10 mg but unclear if that cause insomnia.      Outpatient Encounter Medications as of 02/07/2023  Medication Sig   ARIPiprazole (ABILIFY) 5 MG tablet Take 1 tablet (5 mg total) by mouth daily.   Cholecalciferol (VITAMIN D-3) 125 MCG (5000 UT) TABS Take 5,000 Units by mouth daily. (Patient not taking: Reported on 09/14/2022)   Creatine POWD Take 5 mg by mouth daily.   gabapentin (NEURONTIN) 100 MG capsule Take 1 capsule (100 mg total) by mouth 2 (two) times daily.   ketoconazole (NIZORAL) 2 % cream Apply 1 Application topically daily. (Patient not taking: Reported on 09/14/2022)   Multiple Vitamins-Minerals (MULTIVITAMIN WITH MINERALS) tablet Take 1 tablet by mouth daily.   [DISCONTINUED] alfuzosin (UROXATRAL) 10 MG 24 hr tablet Take 1 tablet (10 mg total) by mouth daily. (Patient not taking: Reported on 09/14/2022)   [DISCONTINUED] amitriptyline (ELAVIL) 25 MG  tablet Take 1 tablet (25 mg total) by mouth at bedtime.   [DISCONTINUED] ARIPiprazole (ABILIFY) 5 MG tablet Take 1 tablet (5 mg total) by mouth daily.   [DISCONTINUED] diphenhydramine-acetaminophen (TYLENOL PM) 25-500 MG TABS tablet Take 2 tablets by mouth at bedtime as needed (sleep). (Patient not taking: Reported on 09/14/2022)   [DISCONTINUED] gabapentin (NEURONTIN) 100 MG capsule Take 1 capsule (100 mg total) by mouth 2 (two) times daily.   [DISCONTINUED] oxyCODONE (OXY IR/ROXICODONE) 5 MG immediate  release tablet Take 1 tablet (5 mg total) by mouth every 6 (six) hours as needed for severe pain. (Patient not taking: Reported on 09/14/2022)   [DISCONTINUED] SUMAtriptan (IMITREX) 50 MG tablet Take 1 tablet (50 mg total) by mouth every 2 (two) hours as needed for migraine. May repeat in 2 hours if headache persists or recurs.   No facility-administered encounter medications on file as of 02/07/2023.    No results found for this or any previous visit (from the past 2160 hour(s)).   Psychiatric Specialty Exam: Physical Exam  Review of Systems  Weight 250 lb (113.4 kg).There is no height or weight on file to calculate BMI.  General Appearance: Casual and beard  Eye Contact:  Good  Speech:  Clear and Coherent  Volume:  Normal  Mood:  Euthymic  Affect:  Appropriate  Thought Process:  Goal Directed  Orientation:  Full (Time, Place, and Person)  Thought Content:  Logical  Suicidal Thoughts:  No  Homicidal Thoughts:  No  Memory:  Immediate;   Good Recent;   Good Remote;   Good  Judgement:  Good  Insight:  Present  Psychomotor Activity:  Normal  Concentration:  Concentration: Good and Attention Span: Good  Recall:  Good  Fund of Knowledge:  Good  Language:  Good  Akathisia:  No  Handed:  Right  AIMS (if indicated):     Assets:  Communication Skills Desire for Improvement Financial Resources/Insurance Resilience Social Support Talents/Skills Transportation  ADL's:  Intact  Cognition:  WNL  Sleep:  better     Assessment/Plan: Bipolar I disorder (HCC) - Plan: ARIPiprazole (ABILIFY) 5 MG tablet  Anxiety - Plan: ARIPiprazole (ABILIFY) 5 MG tablet, gabapentin (NEURONTIN) 100 MG capsule  Mild tetrahydrocannabinol (THC) abuse - Plan: ARIPiprazole (ABILIFY) 5 MG tablet  Trauma in childhood - Plan: ARIPiprazole (ABILIFY) 5 MG tablet  Patient doing better on Abilify.  He reported his mood is much better and does not have anger, irritability or any manic episodes.  He does not  fantasize or have any intrusive thoughts.  Continue Abilify 5 mg daily and gabapentin 1 mg twice a day.  Recommended to call us back if is any question of any concern.  Follow-up in 3 months.   Follow Up Instructions:     I discussed the assessment and treatment plan with the patient. The patient was provided an opportunity to ask questions and all were answered. The patient agreed with the plan and demonstrated an understanding of the instructions.   The patient was advised to call back or seek an in-person evaluation if the symptoms worsen or if the condition fails to improve as anticipated.    Collaboration of Care: Other provider involved in patient's care AEB notes are available in epic to review  Patient/Guardian was advised Release of Information must be obtained prior to any record release in order to collaborate their care with an outside provider. Patient/Guardian was advised if they have not already done so to contact the registration department  to sign all necessary forms in order for [place] to release information regarding their care.   Consent: Patient/Guardian gives verbal consent for treatment and assignment of benefits for services provided during this visit. Patient/Guardian expressed understanding and agreed to proceed.     I provided 19 minutes of non face to face time during this encounter.  Note: This document was prepared by [employer] voice dictation technology and any errors that results from this process are unintentional.    Cleotis Nipper, MD 02/07/2023

## 2023-03-06 DIAGNOSIS — E291 Testicular hypofunction: Secondary | ICD-10-CM | POA: Diagnosis not present

## 2023-03-13 DIAGNOSIS — E291 Testicular hypofunction: Secondary | ICD-10-CM | POA: Diagnosis not present

## 2023-03-20 DIAGNOSIS — R5383 Other fatigue: Secondary | ICD-10-CM | POA: Diagnosis not present

## 2023-03-20 DIAGNOSIS — E291 Testicular hypofunction: Secondary | ICD-10-CM | POA: Diagnosis not present

## 2023-03-20 DIAGNOSIS — Z79890 Hormone replacement therapy (postmenopausal): Secondary | ICD-10-CM | POA: Diagnosis not present

## 2023-03-26 ENCOUNTER — Ambulatory Visit: Payer: BC Managed Care – PPO | Admitting: Neurology

## 2023-04-25 ENCOUNTER — Emergency Department (HOSPITAL_BASED_OUTPATIENT_CLINIC_OR_DEPARTMENT_OTHER): Payer: BC Managed Care – PPO

## 2023-04-25 ENCOUNTER — Other Ambulatory Visit: Payer: Self-pay

## 2023-04-25 ENCOUNTER — Encounter (HOSPITAL_BASED_OUTPATIENT_CLINIC_OR_DEPARTMENT_OTHER): Payer: Self-pay | Admitting: *Deleted

## 2023-04-25 ENCOUNTER — Emergency Department (HOSPITAL_BASED_OUTPATIENT_CLINIC_OR_DEPARTMENT_OTHER)
Admission: EM | Admit: 2023-04-25 | Discharge: 2023-04-25 | Disposition: A | Discharge status: Home / Self Care | Payer: BC Managed Care – PPO | Attending: Emergency Medicine | Admitting: Emergency Medicine

## 2023-04-25 DIAGNOSIS — R109 Unspecified abdominal pain: Secondary | ICD-10-CM

## 2023-04-25 DIAGNOSIS — R1031 Right lower quadrant pain: Secondary | ICD-10-CM | POA: Insufficient documentation

## 2023-04-25 DIAGNOSIS — R3129 Other microscopic hematuria: Secondary | ICD-10-CM | POA: Insufficient documentation

## 2023-04-25 DIAGNOSIS — R1011 Right upper quadrant pain: Secondary | ICD-10-CM | POA: Diagnosis not present

## 2023-04-25 DIAGNOSIS — K4020 Bilateral inguinal hernia, without obstruction or gangrene, not specified as recurrent: Secondary | ICD-10-CM | POA: Diagnosis not present

## 2023-04-25 LAB — CBC WITH DIFFERENTIAL/PLATELET
Abs Immature Granulocytes: 0.03 10*3/uL (ref 0.00–0.07)
Basophils Absolute: 0.1 10*3/uL (ref 0.0–0.1)
Basophils Relative: 1 %
Eosinophils Absolute: 0.3 10*3/uL (ref 0.0–0.5)
Eosinophils Relative: 4 %
HCT: 48.9 % (ref 39.0–52.0)
Hemoglobin: 16.7 g/dL (ref 13.0–17.0)
Immature Granulocytes: 0 %
Lymphocytes Relative: 25 %
Lymphs Abs: 1.7 10*3/uL (ref 0.7–4.0)
MCH: 29.8 pg (ref 26.0–34.0)
MCHC: 34.2 g/dL (ref 30.0–36.0)
MCV: 87.2 fL (ref 80.0–100.0)
Monocytes Absolute: 0.5 10*3/uL (ref 0.1–1.0)
Monocytes Relative: 8 %
Neutro Abs: 4.3 10*3/uL (ref 1.7–7.7)
Neutrophils Relative %: 62 %
Platelets: 282 10*3/uL (ref 150–400)
RBC: 5.61 MIL/uL (ref 4.22–5.81)
RDW: 13.4 % (ref 11.5–15.5)
WBC: 6.9 10*3/uL (ref 4.0–10.5)
nRBC: 0 % (ref 0.0–0.2)

## 2023-04-25 LAB — COMPREHENSIVE METABOLIC PANEL
ALT: 26 U/L (ref 0–44)
AST: 23 U/L (ref 15–41)
Albumin: 4.2 g/dL (ref 3.5–5.0)
Alkaline Phosphatase: 65 U/L (ref 38–126)
Anion gap: 5 (ref 5–15)
BUN: 24 mg/dL — ABNORMAL HIGH (ref 6–20)
CO2: 25 mmol/L (ref 22–32)
Calcium: 9.1 mg/dL (ref 8.9–10.3)
Chloride: 105 mmol/L (ref 98–111)
Creatinine, Ser: 0.77 mg/dL (ref 0.61–1.24)
GFR, Estimated: >60 mL/min (ref >60)
Glucose, Bld: 102 mg/dL — ABNORMAL HIGH (ref 70–99)
Potassium: 4.2 mmol/L (ref 3.5–5.1)
Sodium: 135 mmol/L (ref 135–145)
Total Bilirubin: 0.7 mg/dL (ref 0.0–1.2)
Total Protein: 7.5 g/dL (ref 6.5–8.1)

## 2023-04-25 LAB — URINALYSIS, ROUTINE W REFLEX MICROSCOPIC
Bilirubin Urine: NEGATIVE
Glucose, UA: NEGATIVE mg/dL
Ketones, ur: NEGATIVE mg/dL
Leukocytes,Ua: NEGATIVE
Nitrite: NEGATIVE
Protein, ur: NEGATIVE mg/dL
Specific Gravity, Urine: 1.02 (ref 1.005–1.030)
pH: 7 (ref 5.0–8.0)

## 2023-04-25 LAB — URINALYSIS, MICROSCOPIC (REFLEX)

## 2023-04-25 LAB — LIPASE, BLOOD: Lipase: 46 U/L (ref 11–51)

## 2023-04-25 MED ORDER — KETOROLAC TROMETHAMINE 15 MG/ML IJ SOLN
15.0000 mg | Freq: Once | INTRAMUSCULAR | Status: AC
  Administered 2023-04-25: 15 mg via INTRAVENOUS
  Filled 2023-04-25: qty 1

## 2023-04-25 MED ORDER — LACTATED RINGERS IV BOLUS
1000.0000 mL | Freq: Once | INTRAVENOUS | Status: AC
  Administered 2023-04-25: 1000 mL via INTRAVENOUS

## 2023-04-25 MED ORDER — FENTANYL CITRATE PF 50 MCG/ML IJ SOSY
50.0000 ug | PREFILLED_SYRINGE | Freq: Once | INTRAMUSCULAR | Status: DC
  Filled 2023-04-25: qty 1

## 2023-04-25 NOTE — ED Provider Notes (Signed)
[place] [facility] Provider Note   CSN: [id number] Arrival date & time: 04/25/23  1021     History  Chief Complaint  Patient presents with   Flank Pain    Carlos Tapia is a 53 y.o. male.   Flank Pain Associated symptoms include abdominal pain.     53 year old male with medical history significant for prostatitis, anxiety, depression, bipolar disorder, THC use, treated for his prostatitis and follows with outpatient urology who presents to the emergency department with right-sided flank pain.  And right lower quadrant abdominal pain.  The patient states that pain began around 2 weeks ago and for the past 4 nights she has been having increasing urination as well as right-sided pain in his right flank.  Pain was mild in severity, worse with palpation.  He denies any fevers, chills, he denies any nausea or vomiting.  No history of problems with gallstones or kidney stones.  Home Medications Prior to Admission medications   Medication Sig Start Date End Date Taking? Authorizing Provider  ARIPiprazole (ABILIFY) 5 MG tablet Take 1 tablet (5 mg total) by mouth daily. 02/07/23   Arfeen, Phillips Grout, MD  Cholecalciferol (VITAMIN D-3) 125 MCG (5000 UT) TABS Take 5,000 Units by mouth daily. Patient not taking: Reported on 09/14/2022    [provider]  Creatine POWD Take 5 mg by mouth daily.    [provider]  gabapentin (NEURONTIN) 100 MG capsule Take 1 capsule (100 mg total) by mouth 2 (two) times daily. 02/07/23 05/08/23  Cleotis Nipper, MD  Multiple Vitamins-Minerals (MULTIVITAMIN WITH MINERALS) tablet Take 1 tablet by mouth daily.    [provider]      Allergies    Patient has no known allergies.    Review of Systems   Review of Systems  Gastrointestinal:  Positive for abdominal pain.  Genitourinary:  Positive for flank pain.  All other systems reviewed and are negative.   Physical Exam Updated Vital Signs BP 119/70    Pulse (!) 53   Temp 98.1 F (36.7 C) (Oral)   Resp 18   SpO2 99%  Physical Exam Vitals and nursing note reviewed.  Constitutional:      General: He is not in acute distress.    Appearance: He is well-developed.  HENT:     Head: Normocephalic and atraumatic.  Eyes:     Conjunctiva/sclera: Conjunctivae normal.  Cardiovascular:     Rate and Rhythm: Normal rate and regular rhythm.     Heart sounds: No murmur heard. Pulmonary:     Effort: Pulmonary effort is normal. No respiratory distress.     Breath sounds: Normal breath sounds.  Abdominal:     Palpations: Abdomen is soft.     Tenderness: There is abdominal tenderness in the right upper quadrant and right lower quadrant. There is right CVA tenderness and guarding. There is no left CVA tenderness.  Musculoskeletal:        General: No swelling.     Cervical back: Neck supple.  Skin:    General: Skin is warm and dry.     Capillary Refill: Capillary refill takes less than 2 seconds.  Neurological:     Mental Status: He is alert.  Psychiatric:        Mood and Affect: Mood normal.     ED Results / Procedures / Treatments   Labs (all labs ordered are listed, but only abnormal results are displayed) Labs Reviewed  COMPREHENSIVE METABOLIC  PANEL - Abnormal; Notable for the following components:      Result Value   Glucose, Bld 102 (*)    BUN 24 (*)    All other components within normal limits  URINALYSIS, ROUTINE W REFLEX MICROSCOPIC - Abnormal; Notable for the following components:   Hgb urine dipstick SMALL (*)    All other components within normal limits  URINALYSIS, MICROSCOPIC (REFLEX) - Abnormal; Notable for the following components:   Bacteria, UA RARE (*)    All other components within normal limits  LIPASE, BLOOD  CBC WITH DIFFERENTIAL/PLATELET    EKG None  Radiology US Abdomen Limited RUQ (LIVER/GB) Result Date: 04/25/2023 CLINICAL DATA:  Right upper quadrant pain EXAM: ULTRASOUND ABDOMEN LIMITED RIGHT  UPPER QUADRANT COMPARISON:  CT 04/25/2023 noncontrast FINDINGS: Gallbladder: No gallstones or wall thickening visualized. No sonographic Murphy sign noted by sonographer. Common bile duct: Diameter: 2 mm Liver: No focal lesion identified. Within normal limits in parenchymal echogenicity. Portal vein is patent on color Doppler imaging with normal direction of blood flow towards the liver. Other: None. IMPRESSION: No gallstones or ductal dilatation. Electronically Signed   By: Karen Kays M.D.   On: 04/25/2023 12:16   CT Renal Stone Study Result Date: 04/25/2023 CLINICAL DATA:  Abdominal/flank pain, stone suspected. EXAM: CT ABDOMEN AND PELVIS WITHOUT CONTRAST TECHNIQUE: Multidetector CT imaging of the abdomen and pelvis was performed following the standard protocol without IV contrast. RADIATION DOSE REDUCTION: This exam was performed according to the departmental dose-optimization program which includes automated exposure control, adjustment of the mA and/or kV according to patient size and/or use of iterative reconstruction technique. COMPARISON:  CT scan abdomen and pelvis from 04/25/2022. FINDINGS: Lower chest: The lung bases are clear. No pleural effusion. The heart is normal in size. No pericardial effusion. Hepatobiliary: The liver is normal in size. Non-cirrhotic configuration. No suspicious mass. No intrahepatic or extrahepatic bile duct dilation. No calcified gallstones. Normal gallbladder wall thickness. No pericholecystic inflammatory changes. Pancreas: Unremarkable. No pancreatic ductal dilatation or surrounding inflammatory changes. Spleen: Within normal limits. No focal lesion. Adrenals/Urinary Tract: Adrenal glands are unremarkable. No suspicious renal mass within the limitations of this unenhanced exam. No nephroureterolithiasis or obstructive uropathy. Unremarkable urinary bladder. Stomach/Bowel: No disproportionate dilation of the small or large bowel loops. No evidence of abnormal bowel wall  thickening or inflammatory changes. The appendix is unremarkable. Vascular/Lymphatic: No ascites or pneumoperitoneum. No abdominal or pelvic lymphadenopathy, by size criteria. No aneurysmal dilation of the major abdominal arteries. There are mild peripheral atherosclerotic vascular calcifications of the aorta and its major branches. Reproductive: Normal size prostate. Symmetric seminal vesicles. Other: There are fat containing umbilical and bilateral inguinal hernias. The soft tissues and abdominal wall are otherwise unremarkable. Musculoskeletal: No suspicious osseous lesions. There are mild multilevel degenerative changes in the visualized spine. IMPRESSION: 1. No nephroureterolithiasis or obstructive uropathy. 2. No acute inflammatory process identified within the abdomen or pelvis. 3. Multiple other nonacute observations, as described above. Aortic Atherosclerosis (ICD10-I70.0). Electronically Signed   By: Jules Schick M.D.   On: 04/25/2023 11:37    Procedures Procedures    Medications Ordered in ED Medications  lactated ringers bolus 1,000 mL (0 mLs Intravenous Stopped 04/25/23 1251)  ketorolac (TORADOL) 15 MG/ML injection 15 mg (15 mg Intravenous Given 04/25/23 1149)    ED Course/ Medical Decision Making/ A&P  Medical Decision Making Amount and/or Complexity of Data Reviewed Labs: ordered. Radiology: ordered.  Risk Prescription drug management.     53 year old male with medical history significant for prostatitis, anxiety, depression, bipolar disorder, THC use, treated for his prostatitis and follows with outpatient urology who presents to the emergency department with right-sided flank pain.  And right lower quadrant abdominal pain.  The patient states that pain began around 2 weeks ago and for the past 4 nights she has been having increasing urination as well as right-sided pain in his right flank.  Pain was mild in severity, worse with palpation.  He  denies any fevers, chills, he denies any nausea or vomiting.  No history of problems with gallstones or kidney stones.  Medical Decision Making:   TALHA QUINLEY is a 53 y.o. male who presented to the ED today with abdominal pain, detailed above.     Complete initial physical exam performed, notably the patient  was tender in the RUQ and RLQ and R flank. No rash seen. Pt states pain not consistent with previous shingles infections.     Reviewed and confirmed nursing documentation for past medical history, family history, social history.    Initial Assessment:   With the patient's presentation of abdominal pain, most likely diagnosis is nephrolithiasis. Other diagnoses were considered including (but not limited to) gastroenteritis, colitis, small bowel obstruction, appendicitis, cholecystitis, pancreatitis, UTI, pyelonephritis, diverticulitis. These are considered less likely due to history of present illness and physical exam findings.      Initial Plan:  CBC/CMP to evaluate for underlying infectious/metabolic etiology for patient's abdominal pain  Lipase to evaluate for pancreatitis  EKG to evaluate for cardiac source of pain  CT Ab/pelvis without contrast due to favored nephrolithiasis over GI etiology for patient's abdominal pain  Urinalysis and repeat physical assessment to evaluate for UTI/Pyelonpehritis  Empiric management of symptoms with escalating pain control and antiemetics as needed.   Initial Study Results:   Laboratory  All laboratory results reviewed without evidence of clinically relevant pathology.   Exceptions include: Mild hematuria with Hgb present, no clear evidence of UTI, Rare bacteria, 0-5 WBCs, Negative nitrites and leukocytes     Radiology All images reviewed independently. Agree with radiology report at this time.   US Abdomen Limited RUQ (LIVER/GB) Result Date: 04/25/2023 CLINICAL DATA:  Right upper quadrant pain EXAM: ULTRASOUND ABDOMEN LIMITED RIGHT UPPER  QUADRANT COMPARISON:  CT 04/25/2023 noncontrast FINDINGS: Gallbladder: No gallstones or wall thickening visualized. No sonographic Murphy sign noted by sonographer. Common bile duct: Diameter: 2 mm Liver: No focal lesion identified. Within normal limits in parenchymal echogenicity. Portal vein is patent on color Doppler imaging with normal direction of blood flow towards the liver. Other: None. IMPRESSION: No gallstones or ductal dilatation. Electronically Signed   By: Karen Kays M.D.   On: 04/25/2023 12:16   CT Renal Stone Study Result Date: 04/25/2023 CLINICAL DATA:  Abdominal/flank pain, stone suspected. EXAM: CT ABDOMEN AND PELVIS WITHOUT CONTRAST TECHNIQUE: Multidetector CT imaging of the abdomen and pelvis was performed following the standard protocol without IV contrast. RADIATION DOSE REDUCTION: This exam was performed according to the departmental dose-optimization program which includes automated exposure control, adjustment of the mA and/or kV according to patient size and/or use of iterative reconstruction technique. COMPARISON:  CT scan abdomen and pelvis from 04/25/2022. FINDINGS: Lower chest: The lung bases are clear. No pleural effusion. The heart is normal in size. No pericardial effusion. Hepatobiliary: The liver is normal in size. Non-cirrhotic  configuration. No suspicious mass. No intrahepatic or extrahepatic bile duct dilation. No calcified gallstones. Normal gallbladder wall thickness. No pericholecystic inflammatory changes. Pancreas: Unremarkable. No pancreatic ductal dilatation or surrounding inflammatory changes. Spleen: Within normal limits. No focal lesion. Adrenals/Urinary Tract: Adrenal glands are unremarkable. No suspicious renal mass within the limitations of this unenhanced exam. No nephroureterolithiasis or obstructive uropathy. Unremarkable urinary bladder. Stomach/Bowel: No disproportionate dilation of the small or large bowel loops. No evidence of abnormal bowel wall  thickening or inflammatory changes. The appendix is unremarkable. Vascular/Lymphatic: No ascites or pneumoperitoneum. No abdominal or pelvic lymphadenopathy, by size criteria. No aneurysmal dilation of the major abdominal arteries. There are mild peripheral atherosclerotic vascular calcifications of the aorta and its major branches. Reproductive: Normal size prostate. Symmetric seminal vesicles. Other: There are fat containing umbilical and bilateral inguinal hernias. The soft tissues and abdominal wall are otherwise unremarkable. Musculoskeletal: No suspicious osseous lesions. There are mild multilevel degenerative changes in the visualized spine. IMPRESSION: 1. No nephroureterolithiasis or obstructive uropathy. 2. No acute inflammatory process identified within the abdomen or pelvis. 3. Multiple other nonacute observations, as described above. Aortic Atherosclerosis (ICD10-I70.0). Electronically Signed   By: Jules Schick M.D.   On: 04/25/2023 11:37       Final Reassessment and Plan:   Patient with mild hematuria present on urinalysis, negative for UTI, CT imaging overall reassuring with no evidence of nephroureterolithiasis or obstructive uropathy and no acute inflammatory process identified within the abdomen or pelvis.  Patient with overall reassuring workup, right upper quadrant ultrasound also negative.  Symptoms are consistent with either recently passed stone or possibly peptic ulcer disease given the patient's right upper quadrant pain as well.  Otherwise reassuring workup, low concern for other acute intra-abdominal abnormality, no evidence of AAA, low concern for aortic dissection, overall workup has been reassuring.  Return precautions were provided in the event of any severe worsening symptoms.  Referral placed for outpatient follow-up with gastroenterology for continued outpatient management, plan to follow-up with his PCP for a recheck of his urine to ensure resolution of his  hematuria.   Final Clinical Impression(s) / ED Diagnoses Final diagnoses:  Flank pain  Other microscopic hematuria    Rx / DC Orders ED Discharge Orders          Ordered    Ambulatory referral to Gastroenterology        04/25/23 1238              Ernie Avena, MD 04/25/23 1308

## 2023-04-25 NOTE — Discharge Instructions (Addendum)
Your laboratory evaluation revealed a small amount of microscopic blood in the urine which can be due to a recently passed kidney stone.  There is no evidence of kidney stone seen on your CT imaging.  There was no other acute abnormality seen on your CT imaging or your sound of your gallbladder.  You had mild right upper quadrant tenderness in addition to your lower abdominal and flank pain.  Sometimes right upper quadrant pain can be due to a peptic ulcer.  The only way to diagnose a peptic ulcer is to fully visualize it during an endoscopy.  I recommend you follow-up with your primary care provider to ensure symptom resolution and a referral has also been placed for outpatient follow-up with gastroenterology.  If you have severe worsening symptoms, please return to the emergency department for repeat evaluation.  CT Stone Study: FINDINGS:  Lower chest: The lung bases are clear. No pleural effusion. The  heart is normal in size. No pericardial effusion.    Hepatobiliary: The liver is normal in size. Non-cirrhotic  configuration. No suspicious mass. No intrahepatic or extrahepatic  bile duct dilation. No calcified gallstones. Normal gallbladder wall  thickness. No pericholecystic inflammatory changes.    Pancreas: Unremarkable. No pancreatic ductal dilatation or  surrounding inflammatory changes.    Spleen: Within normal limits. No focal lesion.    Adrenals/Urinary Tract: Adrenal glands are unremarkable. No  suspicious renal mass within the limitations of this unenhanced  exam. No nephroureterolithiasis or obstructive uropathy.  Unremarkable urinary bladder.    Stomach/Bowel: No disproportionate dilation of the small or large  bowel loops. No evidence of abnormal bowel wall thickening or  inflammatory changes. The appendix is unremarkable.    Vascular/Lymphatic: No ascites or pneumoperitoneum. No abdominal or  pelvic lymphadenopathy, by size criteria. No aneurysmal dilation of  the major  abdominal arteries. There are mild peripheral  atherosclerotic vascular calcifications of the aorta and its major  branches.    Reproductive: Normal size prostate. Symmetric seminal vesicles.    Other: There are fat containing umbilical and bilateral inguinal  hernias. The soft tissues and abdominal wall are otherwise  unremarkable.    Musculoskeletal: No suspicious osseous lesions. There are mild  multilevel degenerative changes in the visualized spine.    IMPRESSION:  1. No nephroureterolithiasis or obstructive uropathy.  2. No acute inflammatory process identified within the abdomen or  pelvis.  3. Multiple other nonacute observations, as described above.    Aortic Atherosclerosis (ICD10-I70.0).

## 2023-04-25 NOTE — ED Triage Notes (Signed)
Pt is here with right sided flank pain which began about 2 weeks ago and for the past 4 night he has been having increased urination and right sided pain.  Pt reports hx of prostatitis and UTI.  No fever or chills.

## 2023-04-26 ENCOUNTER — Ambulatory Visit: Payer: BC Managed Care – PPO | Admitting: Urology

## 2023-04-26 ENCOUNTER — Encounter: Payer: Self-pay | Admitting: Urology

## 2023-04-26 VITALS — BP 144/91 | HR 61 | Ht 70.0 in | Wt 260.0 lb

## 2023-04-26 DIAGNOSIS — R109 Unspecified abdominal pain: Secondary | ICD-10-CM | POA: Diagnosis not present

## 2023-04-26 DIAGNOSIS — R3129 Other microscopic hematuria: Secondary | ICD-10-CM | POA: Diagnosis not present
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
## 2023-04-30 ENCOUNTER — Ambulatory Visit: Payer: Self-pay | Admitting: Internal Medicine

## 2023-04-30 LAB — MICROSCOPIC EXAMINATION
Bacteria, UA: NONE SEEN
Epithelial Cells (non renal): NONE SEEN /[HPF] (ref 0–10)
WBC, UA: NONE SEEN /[HPF] (ref 0–5)

## 2023-04-30 LAB — URINALYSIS, ROUTINE W REFLEX MICROSCOPIC
Bilirubin, UA: NEGATIVE
Glucose, UA: NEGATIVE
Ketones, UA: NEGATIVE
Leukocytes,UA: NEGATIVE
Nitrite, UA: NEGATIVE
Protein,UA: NEGATIVE
Specific Gravity, UA: 1.02 (ref 1.005–1.030)
Urobilinogen, Ur: 0.2 mg/dL (ref 0.2–1.0)
pH, UA: 5.5 (ref 5.0–7.5)

## 2023-04-30 NOTE — Telephone Encounter (Signed)
Copied from CRM [id number]. Topic: Clinical - Red Word Triage >> Apr 30, 2023  9:41 AM Shelbie Proctor wrote: Red Word that prompted transfer to Nurse Triage: Patient's wife Carlos Tapia [phone] toke patient to the ER on Tuesday and passed a kidney stone, saw the urologist on Wednesday. Patient is in excruciating pain right lower flack to stomach area, pain when urinating in morning, urgency and frequent urinating, chills, and nausea.   Chief Complaint: Flank pain Symptoms: Right sided flank pain, nausea, chills, increased urinary frequency and urgency  Frequency: Constant  Pertinent Negatives: Patient denies recorded fevers Disposition: [] ED /[] Urgent Care (no appt availability in office) / [x] Appointment(In office/virtual)/ []  [facility]/ [] Home Care/ [] Refused Recommended Disposition /[]  Mobile Bus/ []  Follow-up with PCP Additional Notes: Patient's wife called reporting that the patient has had right flank pain that radiates to his abdomen for the last 2 weeks. She states that last week he was seen at the ED and then followed up with his Urologist. She states that the patient is still experiencing significant pain to his right flank as well as nausea, chills, and increased urinary frequency and urgency. Appointment made for tomorrow morning for the patient.     Reason for Disposition  MODERATE pain (e.g., interferes with normal activities or awakens from sleep)  Answer Assessment - Initial Assessment Questions 1. LOCATION: "Where does it hurt?" (e.g., left, right)     Right sided flank pain 2. ONSET: "When did the pain start?"     Approximately 2 weeks ago 3. SEVERITY: "How bad is the pain?" (e.g., Scale 1-10; mild, moderate, or severe)   - MILD (1-3): doesn't interfere with normal activities    - MODERATE (4-7): interferes with normal activities or awakens from sleep    - SEVERE (8-10): excruciating pain and patient unable to do normal activities (stays in bed)        Moderate  4. PATTERN: "Does the pain come and go, or is it constant?"      Constant  5. CAUSE: "What do you think is causing the pain?"     Possible kidney stone  6. OTHER SYMPTOMS:  "Do you have any other symptoms?" (e.g., fever, abdomen pain, vomiting, leg weakness, burning with urination, blood in urine)     Right flank pain radiates to abdomen, nausea, chills, increased urinary frequency and urgency  Protocols used: Flank Pain-A-AH

## 2023-04-30 NOTE — Progress Notes (Unsigned)
   Acute Office Visit  Subjective:     Patient ID: TYLEN LEVERICH, male    DOB: 01/03/1971, 53 y.o.   MRN: [id number]  No chief complaint on file.   HPI Patient is in today for bilateral flank pain for the last 2 weeks.   ROS Per HPI      Objective:    There were no vitals taken for this visit.   Physical Exam Vitals and nursing note reviewed.  Constitutional:      Appearance: Normal appearance.  HENT:     Head: Normocephalic and atraumatic.  Eyes:     Extraocular Movements: Extraocular movements intact.  Cardiovascular:     Rate and Rhythm: Normal rate and regular rhythm.     Pulses: Normal pulses.     Heart sounds: Normal heart sounds.  Pulmonary:     Effort: Pulmonary effort is normal.     Breath sounds: Normal breath sounds.  Musculoskeletal:        General: Normal range of motion.     Cervical back: Normal range of motion.  Skin:    General: Skin is warm and dry.  Neurological:     General: No focal deficit present.     Mental Status: He is alert and oriented to person, place, and time.  Psychiatric:        Mood and Affect: Mood normal.        Behavior: Behavior normal.   No results found for any visits on 05/01/23.      Assessment & Plan:  ***  No orders of the defined types were placed in this encounter.   No follow-ups on file.  Moshe Cipro, FNP

## 2023-05-01 ENCOUNTER — Ambulatory Visit: Payer: BC Managed Care – PPO | Admitting: Family Medicine

## 2023-05-01 ENCOUNTER — Encounter: Payer: Self-pay | Admitting: Family Medicine

## 2023-05-01 VITALS — BP 136/90 | HR 69 | Temp 98.0°F | Ht 70.0 in | Wt 272.8 lb

## 2023-05-01 DIAGNOSIS — R300 Dysuria: Secondary | ICD-10-CM | POA: Diagnosis not present

## 2023-05-01 DIAGNOSIS — R109 Unspecified abdominal pain: Secondary | ICD-10-CM | POA: Diagnosis not present

## 2023-05-01 MED ORDER — TAMSULOSIN HCL 0.4 MG PO CAPS: 0.4000 mg | ORAL_CAPSULE | Freq: Every day | ORAL | 0 refills | Status: DC

## 2023-05-01 MED ORDER — CYCLOBENZAPRINE HCL 5 MG PO TABS: 5.0000 mg | ORAL_TABLET | Freq: Every day | ORAL | 1 refills | Status: DC

## 2023-05-01 NOTE — Patient Instructions (Addendum)
I have sent in cyclobenzaprine for you to take twice a day as needed for muscle spasms/kidney stone. This medication may make you sleepy. Do not drive or operate heave machinery while taking this medication.  I have sent in flomax for you to take once a day to help with dysuria and flank pain.   You may take these medications if you experience symptoms again.  Follow-up with me for new or worsening symptoms.

## 2023-05-07 ENCOUNTER — Encounter (HOSPITAL_COMMUNITY): Payer: Self-pay | Admitting: Psychiatry

## 2023-05-07 ENCOUNTER — Telehealth (HOSPITAL_BASED_OUTPATIENT_CLINIC_OR_DEPARTMENT_OTHER): Payer: BC Managed Care – PPO | Admitting: Psychiatry

## 2023-05-07 VITALS — Wt 272.0 lb

## 2023-05-07 DIAGNOSIS — F1210 Cannabis abuse, uncomplicated: Secondary | ICD-10-CM

## 2023-05-07 DIAGNOSIS — T1490XA Injury, unspecified, initial encounter: Secondary | ICD-10-CM | POA: Diagnosis not present

## 2023-05-07 DIAGNOSIS — F5101 Primary insomnia: Secondary | ICD-10-CM

## 2023-05-07 DIAGNOSIS — F319 Bipolar disorder, unspecified: Secondary | ICD-10-CM | POA: Diagnosis not present

## 2023-05-07 DIAGNOSIS — F419 Anxiety disorder, unspecified: Secondary | ICD-10-CM | POA: Diagnosis not present

## 2023-05-07 MED ORDER — GABAPENTIN 100 MG PO CAPS: 100.0000 mg | ORAL_CAPSULE | Freq: Every day | ORAL | 0 refills | Status: DC

## 2023-05-07 MED ORDER — ARIPIPRAZOLE 5 MG PO TABS: 5.0000 mg | ORAL_TABLET | Freq: Every day | ORAL | 2 refills | Status: DC
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
## 2023-06-07 ENCOUNTER — Ambulatory Visit: Payer: BC Managed Care – PPO | Admitting: Urology

## 2023-06-07 NOTE — Progress Notes (Deleted)
 Assessment: 1. Microscopic hematuria     Plan: I personally reviewed the CT study from 04/25/2023.  No obvious calculi seen.  No evidence of obstruction. I discussed these results with the patient today.  It is possible that he may have passed a stone and this is the cause of his symptoms.  If this is the case, I would expect that his symptoms improve over the next few days. Recommend follow-up in 4 to 6 weeks for the microscopic hematuria.  Chief Complaint:  No chief complaint on file.   History of Present Illness:  Carlos Tapia is a 53 y.o. male who is seen for further evaluation of recent onset of right sided flank and abdominal pain and increased lower urinary tract symptoms. He was seen in the emergency room on 04/25/2023 for the symptoms.  Urinalysis was unremarkable. CT imaging showed no renal or ureteral calculi and no evidence of obstruction.  He is followed for history of phimosis, history of prostatitis, and lower urinary tract symptoms. He was not circumcised at birth.  He reported a problem with phimosis preventing retraction of his foreskin for a number of years.  He reported irritation of the foreskin primarily after intercourse.  He has a history of genital herpes which by report made the phimosis worse.  He had lower urinary tract symptoms including frequency, urgency, dysuria, nocturia 3-5 times, hesitancy, and sensation of incomplete emptying.  He also reported discomfort in the suprapubic area with the need to void.  No gross hematuria or flank pain. Urinalysis from 06/09/2022 showed 7-10 RBCs and rare bacteria. Urine culture showed no growth. PSA from 3/24: 0.94 He started on on Cipro 500 mg twice daily on 06/09/2022.  He reports some slight improvement in the dysuria. IPSS = 21.  His exam on 06/15/2022 was consistent with prostatitis.  He was started on alfuzosin 10 mg daily and continued on Cipro for a full 28 days.  He noted improvement in his urinary symptoms after  completing the Cipro.  He also noted improvement with alfuzosin.  He had an improved stream and felt like he was emptying his bladder more completely.  He discontinued the alfuzosin recently.  No dysuria or gross hematuria. IPSS = 11. He continued to have problems with his phimosis.    He underwent circumcision on 07/11/22.  Path consistent showed chronic inflammation consistent with phimosis. At his visit in May 2024, he was doing fairly well.  The incision was healing nicely.  He continued on alfuzosin for his lower urinary tract symptoms.  No dysuria or gross hematuria. IPSS = 6.  He was seen on 04/26/23 for evaluation of recent onset of right-sided flank pain.  His symptoms began 2 weeks prior and awakened him from sleep.  He reported pain in the right flank with radiation into the right lower abdomen after voiding.  No other urinary symptoms.  No dysuria or gross hematuria.  His symptoms became severe enough that he went to the emergency room on 04/25/23.  Urinalysis was negative.  CT imaging showed no renal or ureteral calculi, no evidence of obstruction to either kidney.  He noted some improvement in his symptoms.  He was not aware of passing a stone.  No prior history of kidney stones. U/A showed 3-10 RBCs.  Portions of the above documentation were copied from a prior visit for review purposes only.   Past Medical History:  Past Medical History:  Diagnosis Date   Adopted    per unsure of family  medial history   Anxiety    Arthritis    Bipolar 1 disorder (HCC)    Coronary artery calcification    evaluted by cardiology-- dr Carney Harder   Depression    Hemorrhoids 2018   history gi bleed s/p colonoscopy   History of asthma    per pt no longer issue  has not uses inhaler for yrs since wt loss   Hypogonadism male    Lower urinary tract symptoms (LUTS)    Migraines    Mild tetrahydrocannabinol (THC) abuse    Phimosis    Prostatitis    Trauma in childhood    Vitamin D deficiency      Past Surgical History:  Past Surgical History:  Procedure Laterality Date   CIRCUMCISION N/A 07/11/2022   Procedure: CIRCUMCISION ADULT;  Surgeon: Milderd Meager., MD;  [facility];  Service: Urology;  Laterality: N/A;   COLONOSCOPY WITH PROPOFOL  01/05/2017   dr nandigam   ROBOT ASSISTED INGUINAL HERNIA REPAIR Left 05/08/2022   @ [facility]  by dr c. Fredricka Bonine   SHOULDER ARTHROSCOPY W/ ROTATOR CUFF REPAIR Right 06/2020    Allergies:  No Known Allergies  Family History:  Family History  Adopted: Yes  Problem Relation Age of Onset   Heart disease Mother    Colon cancer Neg Hx     Social History:  Social History   Tobacco Use   Smoking status: Former    Current packs/day: 0.00    Average packs/day: 0.8 packs/day for 30.0 years (22.5 ttl pk-yrs)    Types: Cigarettes    Start date: 03/30/1986    Quit date: 03/30/2016    Years since quitting: 7.1   Smokeless tobacco: Never  Vaping Use   Vaping status: Former   Devices: quit 03/ 2018  Substance Use Topics   Alcohol use: Not Currently    Comment: rarely   Drug use: Yes    Frequency: 7.0 times per week    Types: Marijuana    Comment: smoking    ROS: Constitutional:  Negative for fever, chills, weight loss CV: Negative for chest pain, previous MI, hypertension Respiratory:  Negative for shortness of breath, wheezing, sleep apnea, frequent cough GI:  Negative for nausea, vomiting, bloody stool, GERD  Physical exam: There were no vitals taken for this visit. GENERAL APPEARANCE:  Well appearing, well developed, well nourished, NAD HEENT:  Atraumatic, normocephalic, oropharynx clear NECK:  Supple without lymphadenopathy or thyromegaly ABDOMEN:  Soft, non-tender, no masses EXTREMITIES:  Moves all extremities well, without clubbing, cyanosis, or edema NEUROLOGIC:  Alert and oriented x 3, normal gait, CN II-XII grossly intact MENTAL STATUS:  appropriate BACK:  Non-tender to palpation, No  CVAT SKIN:  Warm, dry, and intact   Results: U/A:

## 2023-08-03 ENCOUNTER — Encounter (HOSPITAL_COMMUNITY): Payer: Self-pay

## 2023-08-03 ENCOUNTER — Telehealth (HOSPITAL_BASED_OUTPATIENT_CLINIC_OR_DEPARTMENT_OTHER): Payer: BC Managed Care – PPO | Admitting: Psychiatry

## 2023-08-03 DIAGNOSIS — Z91199 Patient's noncompliance with other medical treatment and regimen due to unspecified reason: Secondary | ICD-10-CM

## 2023-08-03 NOTE — Progress Notes (Signed)
 Patient is not on video platform.  I called and his wife who told that he cannot keep today`s appointment because they are ready to leave for their daughters graduation.  Will reschedule appointment.

## 2023-08-18 ENCOUNTER — Other Ambulatory Visit (HOSPITAL_COMMUNITY): Payer: Self-pay | Admitting: Psychiatry

## 2023-08-18 DIAGNOSIS — F419 Anxiety disorder, unspecified: Secondary | ICD-10-CM

## 2023-08-31 ENCOUNTER — Other Ambulatory Visit (HOSPITAL_COMMUNITY): Payer: Self-pay | Admitting: Psychiatry

## 2023-08-31 DIAGNOSIS — F1210 Cannabis abuse, uncomplicated: Secondary | ICD-10-CM

## 2023-08-31 DIAGNOSIS — F319 Bipolar disorder, unspecified: Secondary | ICD-10-CM

## 2023-08-31 DIAGNOSIS — F419 Anxiety disorder, unspecified: Secondary | ICD-10-CM

## 2023-08-31 DIAGNOSIS — Z62819 Personal history of unspecified abuse in childhood: Secondary | ICD-10-CM

## 2023-09-14 ENCOUNTER — Telehealth: Payer: Self-pay

## 2023-09-14 ENCOUNTER — Encounter: Payer: Self-pay | Admitting: Family Medicine

## 2023-09-14 ENCOUNTER — Other Ambulatory Visit (HOSPITAL_COMMUNITY): Payer: Self-pay

## 2023-09-14 ENCOUNTER — Ambulatory Visit (INDEPENDENT_AMBULATORY_CARE_PROVIDER_SITE_OTHER): Payer: Self-pay | Admitting: Family Medicine

## 2023-09-14 VITALS — BP 138/90 | HR 55 | Temp 98.4°F | Ht 70.0 in | Wt 279.8 lb

## 2023-09-14 DIAGNOSIS — G44021 Chronic cluster headache, intractable: Secondary | ICD-10-CM

## 2023-09-14 DIAGNOSIS — G43809 Other migraine, not intractable, without status migrainosus: Secondary | ICD-10-CM | POA: Diagnosis not present

## 2023-09-14 DIAGNOSIS — F411 Generalized anxiety disorder: Secondary | ICD-10-CM | POA: Diagnosis not present

## 2023-09-14 DIAGNOSIS — R42 Dizziness and giddiness: Secondary | ICD-10-CM | POA: Diagnosis not present

## 2023-09-14 DIAGNOSIS — Z6841 Body Mass Index (BMI) 40.0 and over, adult: Secondary | ICD-10-CM

## 2023-09-14 LAB — CBC WITH DIFFERENTIAL/PLATELET
Basophils Absolute: 0 10*3/uL (ref 0.0–0.1)
Basophils Relative: 0.6 % (ref 0.0–3.0)
Eosinophils Absolute: 0.4 10*3/uL (ref 0.0–0.7)
Eosinophils Relative: 4.5 % (ref 0.0–5.0)
HCT: 48.5 % (ref 39.0–52.0)
Hemoglobin: 16.2 g/dL (ref 13.0–17.0)
Lymphocytes Relative: 24.6 % (ref 12.0–46.0)
Lymphs Abs: 2 10*3/uL (ref 0.7–4.0)
MCHC: 33.4 g/dL (ref 30.0–36.0)
MCV: 91.2 fl (ref 78.0–100.0)
Monocytes Absolute: 0.8 10*3/uL (ref 0.1–1.0)
Monocytes Relative: 9.3 % (ref 3.0–12.0)
Neutro Abs: 4.9 10*3/uL (ref 1.4–7.7)
Neutrophils Relative %: 61 % (ref 43.0–77.0)
Platelets: 245 10*3/uL (ref 150.0–400.0)
RBC: 5.32 Mil/uL (ref 4.22–5.81)
RDW: 13.5 % (ref 11.5–15.5)
WBC: 8.1 10*3/uL (ref 4.0–10.5)

## 2023-09-14 LAB — COMPREHENSIVE METABOLIC PANEL WITH GFR
ALT: 24 U/L (ref 0–53)
AST: 20 U/L (ref 0–37)
Albumin: 4.6 g/dL (ref 3.5–5.2)
Alkaline Phosphatase: 65 U/L (ref 39–117)
BUN: 17 mg/dL (ref 6–23)
CO2: 25 meq/L (ref 19–32)
Calcium: 9.4 mg/dL (ref 8.4–10.5)
Chloride: 106 meq/L (ref 96–112)
Creatinine, Ser: 0.91 mg/dL (ref 0.40–1.50)
GFR: 97.92 mL/min (ref >60.00)
Glucose, Bld: 89 mg/dL (ref 70–99)
Potassium: 4.1 meq/L (ref 3.5–5.1)
Sodium: 138 meq/L (ref 135–145)
Total Bilirubin: 0.3 mg/dL (ref 0.2–1.2)
Total Protein: 7.2 g/dL (ref 6.0–8.3)

## 2023-09-14 MED ORDER — TOPIRAMATE 25 MG PO TABS: 25.0000 mg | ORAL_TABLET | Freq: Two times a day (BID) | ORAL | 1 refills | Status: DC

## 2023-09-14 MED ORDER — EMGALITY (300 MG DOSE) 100 MG/ML ~~LOC~~ SOSY
300.0000 mg | PREFILLED_SYRINGE | SUBCUTANEOUS | 0 refills | Status: DC
Start: 2023-09-14 — End: 2023-10-11

## 2023-09-14 MED ORDER — LORAZEPAM 0.5 MG PO TABS: 0.5000 mg | ORAL_TABLET | Freq: Two times a day (BID) | ORAL | 1 refills | Status: DC | PRN

## 2023-09-14 NOTE — Patient Instructions (Addendum)
 I have sent in emgality for you to take 300mg  once.   Then the next month, take 150mg  once a month.  I have sent in topamax for you to start. Take 1/2 tablet twice a day for a week, then increase to one tablet twice a day.  EKG looks good today.  We are checking labs today, will be in contact with any results that require further attention.  I have sent in ativan for you to take one tablet once a day as needed for increased anxiety.  Follow up with me in one month for evaluation of medication effectiveness.

## 2023-09-14 NOTE — Telephone Encounter (Signed)
 Pharmacy Patient Advocate Encounter   Received notification from CoverMyMeds that prior authorization for Emgality 100MG /ML syringes (episodic cluster headache) is required/requested.   Insurance verification completed.   The patient is insured through [employer] .   Per test claim: PA required; PA submitted to above mentioned insurance via CoverMyMeds Key/confirmation #/EOC [id number] Status is pending

## 2023-09-15 ENCOUNTER — Ambulatory Visit: Payer: Self-pay | Admitting: Family Medicine

## 2023-09-15 ENCOUNTER — Encounter: Payer: Self-pay | Admitting: Family Medicine

## 2023-09-15 DIAGNOSIS — R42 Dizziness and giddiness: Secondary | ICD-10-CM | POA: Insufficient documentation

## 2023-09-15 DIAGNOSIS — Z6841 Body Mass Index (BMI) 40.0 and over, adult: Secondary | ICD-10-CM | POA: Insufficient documentation

## 2023-09-15 DIAGNOSIS — F411 Generalized anxiety disorder: Secondary | ICD-10-CM | POA: Insufficient documentation

## 2023-09-15 DIAGNOSIS — G44021 Chronic cluster headache, intractable: Secondary | ICD-10-CM | POA: Insufficient documentation

## 2023-09-15 DIAGNOSIS — G43809 Other migraine, not intractable, without status migrainosus: Secondary | ICD-10-CM | POA: Insufficient documentation

## 2023-09-15 NOTE — Assessment & Plan Note (Signed)
 Emgality to pharmacy

## 2023-09-15 NOTE — Assessment & Plan Note (Signed)
 Trial low-dose Ativan, 0.5 mg, 1 tablet twice daily as needed

## 2023-09-15 NOTE — Assessment & Plan Note (Signed)
 Continue efforts in healthy diet and activity level

## 2023-09-15 NOTE — Assessment & Plan Note (Signed)
-   CBC, CMP today.

## 2023-09-15 NOTE — Progress Notes (Signed)
 Acute Office Visit  Subjective:     Patient ID: Carlos Tapia, male    DOB: [birth year], 53 y.o.   MRN: [id number]  Chief Complaint  Patient presents with   Dizziness    Pressure running across the front of his head, eyes are tired, has been going on for months. Feels stuck, cannot think straight. Head feels heavy, difficulty holding up. Pinched a nerve in his neck about 2 years ago    Dizziness   Patient is in today for persistent headaches.  Has been taking ibuprofen , Tylenol , has prescription for sumatriptan  50 mg.  Has been taking 1/2 tablet of this.  States it makes him feel very groggy.  Tried gabapentin  for headaches as well with no relief. Has seen neurology, psychiatry, sports medicine and has completed physical therapy thinking this could be related to his neck.  Nothing has been able to relieve his headaches. Reports intermittent episodes of syncope with headaches. Denies known head injury. Neuro and psych notes reviewed by me. He has had negative MRI in June 2024 to eval for the same type of headaches.  Describes eye pain and difficulty with moving eyes or direction, phonophobia, photophobia, sometimes nausea.  Reports dizziness, vision changes, loss of depth perception at times, scalp tenderness, red eye redness and tearing.  Reports that he is also having increased anxiety when these episodes occur because he does not know how long they are going to last.  This is terribly impairing his ability to function daily.   Review of Systems  Neurological:  Positive for dizziness.   Per HPI      Objective:    BP (!) 138/90 (BP Location: Left Arm, Patient Position: Sitting)   Pulse (!) 55   Temp 98.4 F (36.9 C) (Temporal)   Ht 5' 10 (1.778 m)   Wt 279 lb 12.8 oz (126.9 kg)   SpO2 98%   BMI 40.15 kg/m    Physical Exam Vitals and nursing note reviewed.  Constitutional:      General: He is not in acute distress.    Appearance: He is obese.     Comments:  Appears fatigued  HENT:     Head: Normocephalic and atraumatic.     Right Ear: External ear normal.     Left Ear: External ear normal.     Nose: Nose normal.     Mouth/Throat:     Mouth: Mucous membranes are moist.     Pharynx: Oropharynx is clear.   Eyes:     General: Lids are normal. Vision grossly intact. Gaze aligned appropriately.     Extraocular Movements: Extraocular movements intact.     Conjunctiva/sclera:     Right eye: Right conjunctiva is not injected. No chemosis, exudate or hemorrhage.    Left eye: Left conjunctiva is not injected. No chemosis, exudate or hemorrhage.    Comments: Reports pain with cardinal gaze, especially with upward movement.  No significant nystagmus, no abnormal pupillary dilation, no discharge noted   Cardiovascular:     Rate and Rhythm: Normal rate and regular rhythm.     Pulses: Normal pulses.     Heart sounds: Normal heart sounds.  Pulmonary:     Effort: Pulmonary effort is normal. No respiratory distress.     Breath sounds: Normal breath sounds. No wheezing, rhonchi or rales.   Musculoskeletal:        General: Normal range of motion.     Cervical back: Normal range of motion.  Right lower leg: No edema.     Left lower leg: No edema.  Lymphadenopathy:     Cervical: No cervical adenopathy.   Skin:    General: Skin is warm and dry.   Neurological:     General: No focal deficit present.     Mental Status: He is alert and oriented to person, place, and time.   Psychiatric:        Mood and Affect: Mood normal.        Behavior: Behavior normal.    EKG: Indication: dizziness, syncope Rate: 60 Interpretation: NSR Changes from previous: no   Results for orders placed or performed in visit on 09/14/23  CBC w/Diff  Result Value Ref Range   WBC 8.1 4.0 - 10.5 K/uL   RBC 5.32 4.22 - 5.81 Mil/uL   Hemoglobin 16.2 13.0 - 17.0 g/dL   HCT 16.1 09.6 - 04.5 %   MCV 91.2 78.0 - 100.0 fl   MCHC 33.4 30.0 - 36.0 g/dL   RDW 40.9 81.1 -  91.4 %   Platelets 245.0 150.0 - 400.0 K/uL   Neutrophils Relative % 61.0 43.0 - 77.0 %   Lymphocytes Relative 24.6 12.0 - 46.0 %   Monocytes Relative 9.3 3.0 - 12.0 %   Eosinophils Relative 4.5 0.0 - 5.0 %   Basophils Relative 0.6 0.0 - 3.0 %   Neutro Abs 4.9 1.4 - 7.7 K/uL   Lymphs Abs 2.0 0.7 - 4.0 K/uL   Monocytes Absolute 0.8 0.1 - 1.0 K/uL   Eosinophils Absolute 0.4 0.0 - 0.7 K/uL   Basophils Absolute 0.0 0.0 - 0.1 K/uL  Comp Met (CMET)  Result Value Ref Range   Sodium 138 135 - 145 mEq/L   Potassium 4.1 3.5 - 5.1 mEq/L   Chloride 106 96 - 112 mEq/L   CO2 25 19 - 32 mEq/L   Glucose, Bld 89 70 - 99 mg/dL   BUN 17 6 - 23 mg/dL   Creatinine, Ser 7.82 0.40 - 1.50 mg/dL   Total Bilirubin 0.3 0.2 - 1.2 mg/dL   Alkaline Phosphatase 65 39 - 117 U/L   AST 20 0 - 37 U/L   ALT 24 0 - 53 U/L   Total Protein 7.2 6.0 - 8.3 g/dL   Albumin 4.6 3.5 - 5.2 g/dL   GFR 95.62 >13.08 mL/min   Calcium 9.4 8.4 - 10.5 mg/dL        Assessment & Plan:   Intractable chronic cluster headache Assessment & Plan: Emgality to pharmacy  Orders: -     Emgality (300 MG Dose); Inject 300 mg into the skin every 30 (thirty) days.  Dispense: 3.08 mL; Refill: 0 -     Topiramate; Take 1 tablet (25 mg total) by mouth 2 (two) times daily. Take 1/2 tablet twice a day for a week, then move up to 1 tablet  Dispense: 60 tablet; Refill: 1 -     CBC with Differential/Platelet -     Comprehensive metabolic panel with GFR  Lightheadedness Assessment & Plan: CBC, CMP today  Orders: -     EKG 12-Lead -     CBC with Differential/Platelet -     Comprehensive metabolic panel with GFR  Vestibular migraine Assessment & Plan: Trial of Topamax 25 mg twice daily, start with half tablet twice a day for a week then increase to a whole tablet twice a day Emgality to pharmacy  Orders: -     Emgality (300 MG Dose); Inject  300 mg into the skin every 30 (thirty) days.  Dispense: 3.08 mL; Refill: 0 -     Topiramate;  Take 1 tablet (25 mg total) by mouth 2 (two) times daily. Take 1/2 tablet twice a day for a week, then move up to 1 tablet  Dispense: 60 tablet; Refill: 1 -     CBC with Differential/Platelet -     Comprehensive metabolic panel with GFR -     LORazepam; Take 1 tablet (0.5 mg total) by mouth 2 (two) times daily as needed for anxiety.  Dispense: 30 tablet; Refill: 1  GAD (generalized anxiety disorder) Assessment & Plan: Trial low-dose Ativan, 0.5 mg, 1 tablet twice daily as needed  Orders: -     CBC with Differential/Platelet -     Comprehensive metabolic panel with GFR -     LORazepam; Take 1 tablet (0.5 mg total) by mouth 2 (two) times daily as needed for anxiety.  Dispense: 30 tablet; Refill: 1  Body mass index (BMI) of 40.0-44.9 in adult [facility]) Assessment & Plan: Continue efforts in healthy diet and activity level      Meds ordered this encounter  Medications   Galcanezumab-gnlm (EMGALITY, 300 MG DOSE,) 100 MG/ML SOSY    Sig: Inject 300 mg into the skin every 30 (thirty) days.    Dispense:  3.08 mL    Refill:  0   topiramate (TOPAMAX) 25 MG tablet    Sig: Take 1 tablet (25 mg total) by mouth 2 (two) times daily. Take 1/2 tablet twice a day for a week, then move up to 1 tablet    Dispense:  60 tablet    Refill:  1   LORazepam (ATIVAN) 0.5 MG tablet    Sig: Take 1 tablet (0.5 mg total) by mouth 2 (two) times daily as needed for anxiety.    Dispense:  30 tablet    Refill:  1   I spent 25 minutes on this patient encounter including counseling, diagnosis, treatment options, documentation, face-to-face time.  Return in about 4 weeks (around 10/12/2023) for recheck migraines.  Wellington Half, FNP

## 2023-09-15 NOTE — Assessment & Plan Note (Signed)
 Trial of Topamax 25 mg twice daily, start with half tablet twice a day for a week then increase to a whole tablet twice a day Emgality to pharmacy

## 2023-09-19 ENCOUNTER — Other Ambulatory Visit (HOSPITAL_COMMUNITY): Payer: Self-pay

## 2023-09-19 NOTE — Telephone Encounter (Signed)
 Pharmacy Patient Advocate Encounter  Received notification from [employer] that Prior Authorization for Emgality 100MG /ML syringes (episodic cluster headache)  has been APPROVED from 08/15/2023 to 09/13/2024. Unable to obtain price due to refill too soon rejection, last fill date 09/17/2023 next available fill date 10/01/2023   PA #/Case ID/Reference #: [id number]

## 2023-09-25 ENCOUNTER — Telehealth: Payer: Self-pay

## 2023-09-25 DIAGNOSIS — G44021 Chronic cluster headache, intractable: Secondary | ICD-10-CM

## 2023-09-25 NOTE — Telephone Encounter (Signed)
 Copied from CRM [id number]. Topic: Referral - Request for Referral >> Sep 24, 2023  4:50 PM Zebedee SAUNDERS wrote: Did the patient discuss referral with their provider in the last year? Yes, 09/14/2023 (If No - schedule appointment) (If Yes - send message)  Appointment offered? Yes  Type of order/referral and detailed reason for visit: [facility]   Preference of office, provider, location: [address] [place], [place], [place], [place] [address] [phone]  If referral order, have you been seen by this specialty before? No (If Yes, this issue or another issue? When? Where?  Can we respond through MyChart? No

## 2023-09-25 NOTE — Telephone Encounter (Signed)
Ok referral is done.

## 2023-09-26 ENCOUNTER — Encounter (HOSPITAL_BASED_OUTPATIENT_CLINIC_OR_DEPARTMENT_OTHER): Payer: Self-pay

## 2023-09-26 ENCOUNTER — Emergency Department (HOSPITAL_BASED_OUTPATIENT_CLINIC_OR_DEPARTMENT_OTHER)
Admission: EM | Admit: 2023-09-26 | Discharge: 2023-09-26 | Disposition: A | Discharge status: Home / Self Care | Attending: Emergency Medicine | Admitting: Emergency Medicine

## 2023-09-26 ENCOUNTER — Other Ambulatory Visit: Payer: Self-pay

## 2023-09-26 ENCOUNTER — Emergency Department (HOSPITAL_BASED_OUTPATIENT_CLINIC_OR_DEPARTMENT_OTHER)

## 2023-09-26 DIAGNOSIS — I6529 Occlusion and stenosis of unspecified carotid artery: Secondary | ICD-10-CM | POA: Diagnosis not present

## 2023-09-26 DIAGNOSIS — R29818 Other symptoms and signs involving the nervous system: Secondary | ICD-10-CM | POA: Diagnosis not present

## 2023-09-26 DIAGNOSIS — R42 Dizziness and giddiness: Secondary | ICD-10-CM | POA: Diagnosis not present

## 2023-09-26 DIAGNOSIS — R519 Headache, unspecified: Secondary | ICD-10-CM | POA: Diagnosis not present

## 2023-09-26 DIAGNOSIS — E041 Nontoxic single thyroid nodule: Secondary | ICD-10-CM | POA: Diagnosis not present

## 2023-09-26 DIAGNOSIS — G43809 Other migraine, not intractable, without status migrainosus: Secondary | ICD-10-CM | POA: Insufficient documentation

## 2023-09-26 LAB — CBC WITH DIFFERENTIAL/PLATELET
Abs Immature Granulocytes: 0.05 10*3/uL (ref 0.00–0.07)
Basophils Absolute: 0.1 10*3/uL (ref 0.0–0.1)
Basophils Relative: 1 %
Eosinophils Absolute: 0.4 10*3/uL (ref 0.0–0.5)
Eosinophils Relative: 4 %
HCT: 48 % (ref 39.0–52.0)
Hemoglobin: 16.1 g/dL (ref 13.0–17.0)
Immature Granulocytes: 1 %
Lymphocytes Relative: 26 %
Lymphs Abs: 2.3 10*3/uL (ref 0.7–4.0)
MCH: 30.3 pg (ref 26.0–34.0)
MCHC: 33.5 g/dL (ref 30.0–36.0)
MCV: 90.2 fL (ref 80.0–100.0)
Monocytes Absolute: 0.9 10*3/uL (ref 0.1–1.0)
Monocytes Relative: 10 %
Neutro Abs: 5.3 10*3/uL (ref 1.7–7.7)
Neutrophils Relative %: 58 %
Platelets: 285 10*3/uL (ref 150–400)
RBC: 5.32 MIL/uL (ref 4.22–5.81)
RDW: 13 % (ref 11.5–15.5)
WBC: 9 10*3/uL (ref 4.0–10.5)
nRBC: 0 % (ref 0.0–0.2)

## 2023-09-26 LAB — BASIC METABOLIC PANEL WITH GFR
Anion gap: 9 (ref 5–15)
BUN: 14 mg/dL (ref 6–20)
CO2: 23 mmol/L (ref 22–32)
Calcium: 9.3 mg/dL (ref 8.9–10.3)
Chloride: 106 mmol/L (ref 98–111)
Creatinine, Ser: 0.83 mg/dL (ref 0.61–1.24)
GFR, Estimated: >60 mL/min (ref >60)
Glucose, Bld: 90 mg/dL (ref 70–99)
Potassium: 4.5 mmol/L (ref 3.5–5.1)
Sodium: 138 mmol/L (ref 135–145)

## 2023-09-26 MED ORDER — IOHEXOL 350 MG/ML SOLN
75.0000 mL | Freq: Once | INTRAVENOUS | Status: AC | PRN
  Administered 2023-09-26: 75 mL via INTRAVENOUS

## 2023-09-26 MED ORDER — PROCHLORPERAZINE EDISYLATE 10 MG/2ML IJ SOLN
10.0000 mg | Freq: Once | INTRAMUSCULAR | Status: AC
  Administered 2023-09-26: 10 mg via INTRAVENOUS
  Filled 2023-09-26: qty 2

## 2023-09-26 MED ORDER — BUTALBITAL-APAP-CAFFEINE 50-325-40 MG PO TABS: 1.0000 | ORAL_TABLET | Freq: Four times a day (QID) | ORAL | 0 refills | Status: AC | PRN

## 2023-09-26 MED ORDER — DIPHENHYDRAMINE HCL 50 MG/ML IJ SOLN
12.5000 mg | Freq: Once | INTRAMUSCULAR | Status: AC
  Administered 2023-09-26: 12.5 mg via INTRAVENOUS
  Filled 2023-09-26: qty 1

## 2023-09-26 MED ORDER — DEXAMETHASONE SODIUM PHOSPHATE 10 MG/ML IJ SOLN
10.0000 mg | Freq: Once | INTRAMUSCULAR | Status: AC
  Administered 2023-09-26: 10 mg via INTRAVENOUS
  Filled 2023-09-26: qty 1

## 2023-09-26 NOTE — Discharge Instructions (Signed)
 Take Fioricet for any breakthrough headaches.  This medication is sedating so please be careful with its use.  Do not mix with alcohol drugs or dangerous activities including driving.  Follow-up with neurology as scheduled.  Follow-up with your primary care doctor regarding thyroid  nodule.  Turn to symptoms worsen.

## 2023-09-26 NOTE — ED Provider Notes (Signed)
 [place] EMERGENCY DEPARTMENT AT [facility] Provider Note   CSN: [id number] Arrival date & time: 09/26/23  1747     Patient presents with: Headache   Carlos Tapia is a 53 y.o. male.   Patient here with headache.  On and off the last several months.  He is on Topamax .  He is on injectable medicine for this as well now.  He supposed to follow-up with neurology next month.  Denies any weakness numbness tingling.  Pain and pressure throughout his head at times.  Difficulty with sleep.  Has a history of anxiety bipolar.  Denies any weakness numbness tingling.  Denies any vision loss.  No fever or chills.  The history is provided by the patient.       Prior to Admission medications   Medication Sig Start Date End Date Taking? Authorizing Provider  butalbital-acetaminophen -caffeine (FIORICET) 50-325-40 MG tablet Take 1 tablet by mouth every 6 (six) hours as needed for up to 20 days for headache. 09/26/23 10/16/23 Yes Barney Russomanno, DO  Cholecalciferol (VITAMIN D -3) 125 MCG (5000 UT) TABS Take 5,000 Units by mouth daily.    [provider]  Creatine POWD Take 5 mg by mouth daily.    [provider]  cyclobenzaprine  (FLEXERIL ) 5 MG tablet Take 1 tablet (5 mg total) by mouth at bedtime. 05/01/23   Alvia Corean CROME, FNP  Galcanezumab -gnlm (EMGALITY , 300 MG DOSE,) 100 MG/ML SOSY Inject 300 mg into the skin every 30 (thirty) days. 09/14/23   Alvia Corean CROME, FNP  LORazepam  (ATIVAN ) 0.5 MG tablet Take 1 tablet (0.5 mg total) by mouth 2 (two) times daily as needed for anxiety. 09/14/23   Alvia Corean CROME, FNP  Multiple Vitamins-Minerals (MULTIVITAMIN WITH MINERALS) tablet Take 1 tablet by mouth daily.    [provider]  topiramate  (TOPAMAX ) 25 MG tablet Take 1 tablet (25 mg total) by mouth 2 (two) times daily. Take 1/2 tablet twice a day for a week, then move up to 1 tablet 09/14/23   Alvia Corean CROME, FNP    Allergies: Patient has no known  allergies.    Review of Systems  Updated Vital Signs BP (!) 135/91 (BP Location: Right Arm)   Pulse 69   Temp 98.5 F (36.9 C)   Resp 18   SpO2 96%   Physical Exam Vitals and nursing note reviewed.  Constitutional:      General: He is not in acute distress.    Appearance: He is well-developed.  HENT:     Head: Normocephalic and atraumatic.     Mouth/Throat:     Mouth: Mucous membranes are moist.   Eyes:     Extraocular Movements: Extraocular movements intact.     Right eye: Normal extraocular motion and no nystagmus.     Left eye: Normal extraocular motion and no nystagmus.     Conjunctiva/sclera: Conjunctivae normal.     Pupils: Pupils are equal, round, and reactive to light.    Cardiovascular:     Rate and Rhythm: Normal rate and regular rhythm.     Heart sounds: No murmur heard. Pulmonary:     Effort: Pulmonary effort is normal. No respiratory distress.     Breath sounds: Normal breath sounds.  Abdominal:     Palpations: Abdomen is soft.     Tenderness: There is no abdominal tenderness.   Musculoskeletal:        General: No swelling.     Cervical back: Normal range of motion and neck supple.  Skin:    General: Skin is warm and dry.     Capillary Refill: Capillary refill takes less than 2 seconds.   Neurological:     Mental Status: He is alert and oriented to person, place, and time.     Comments: 5+ out of 5 strength, normal sensation, no drift normal coordination normal vision  Psychiatric:        Mood and Affect: Mood normal.     (all labs ordered are listed, but only abnormal results are displayed) Labs Reviewed  CBC WITH DIFFERENTIAL/PLATELET  BASIC METABOLIC PANEL WITH GFR    EKG: None  Radiology: CT ANGIO HEAD NECK W WO CM Result Date: 09/26/2023 CLINICAL DATA:  Neuro deficit, concern for stroke, migraine lasting for several weeks with dizziness and pain down neck. EXAM: CT ANGIOGRAPHY HEAD AND NECK WITH AND WITHOUT CONTRAST TECHNIQUE:  Multidetector CT imaging of the head and neck was performed using the standard protocol during bolus administration of intravenous contrast. Multiplanar CT image reconstructions and MIPs were obtained to evaluate the vascular anatomy. Carotid stenosis measurements (when applicable) are obtained utilizing NASCET criteria, using the distal internal carotid diameter as the denominator. RADIATION DOSE REDUCTION: This exam was performed according to the departmental dose-optimization program which includes automated exposure control, adjustment of the mA and/or kV according to patient size and/or use of iterative reconstruction technique. CONTRAST:  75mL OMNIPAQUE  IOHEXOL  350 MG/ML SOLN COMPARISON:  MRI head 09/26/2022. FINDINGS: CT HEAD FINDINGS Brain: No acute intracranial hemorrhage. No CT evidence of acute infarct. No edema, mass effect, or midline shift. The basilar cisterns are patent. Ventricles: Ventricles are normal in size and configuration. Vascular: No hyperdense vessel. Skull: No acute or aggressive finding. Sinuses/orbits: Orbits are symmetric. Mild mucosal thickening in the ethmoid sinuses and right maxillary sinus. Other: Mastoid air cells are clear. CTA NECK FINDINGS Aortic arch: Standard configuration of the aortic arch. Imaged portion shows no evidence of aneurysm or dissection. No significant stenosis of the major arch vessel origins. Pulmonary arteries: As permitted by contrast timing, there are no filling defects in the visualized pulmonary arteries. Subclavian arteries: The subclavian arteries are patent bilaterally. Right carotid system: No evidence of dissection, stenosis (50% or greater), or occlusion. Minimal atherosclerosis at the carotid bifurcation. Left carotid system: No evidence of dissection, stenosis (50% or greater), or occlusion. Minimal atherosclerosis at the carotid bifurcation. Vertebral arteries: Codominant. No evidence of dissection, stenosis (50% or greater), or occlusion.  Skeleton: No acute or aggressive finding noted. Other neck: The visualized airway is patent. No cervical lymphadenopathy. Nodule in the inferior left thyroid  lobe measuring up to 1.6 cm. Upper chest: Visualized lung apices are clear. Review of the MIP images confirms the above findings CTA HEAD FINDINGS ANTERIOR CIRCULATION: The intracranial ICAs are patent bilaterally. Minimal atherosclerosis in the carotid siphons. No significant stenosis, proximal occlusion, aneurysm, or vascular malformation. MCAs: The middle cerebral arteries are patent bilaterally. ACAs: The anterior cerebral arteries are patent bilaterally. POSTERIOR CIRCULATION: No significant stenosis, proximal occlusion, aneurysm, or vascular malformation. PCAs: The posterior cerebral arteries are patent bilaterally. Pcomm: Not well visualized. SCAs: The superior cerebellar arteries are patent bilaterally. Basilar artery: Patent AICAs: Visualized on the right. PICAs: Visualized on the left. Vertebral arteries: The intracranial vertebral arteries are patent. Venous sinuses: As permitted by contrast timing, patent. Anatomic variants: None Review of the MIP images confirms the above findings IMPRESSION: No large vessel occlusion. No high-grade stenosis, aneurysm, or dissection of the arteries in the head and neck. No CT  evidence of acute intracranial abnormality. 1.6 cm nodule in the left thyroid  lobe. Recommend nonemergent correlation with thyroid  ultrasound. Electronically Signed   By: Donnice Mania M.D.   On: 09/26/2023 19:48     Procedures   Medications Ordered in the ED  prochlorperazine (COMPAZINE) injection 10 mg (has no administration in time range)  diphenhydrAMINE (BENADRYL) injection 12.5 mg (has no administration in time range)  dexamethasone  (DECADRON ) injection 10 mg (has no administration in time range)  iohexol  (OMNIPAQUE ) 350 MG/ML injection 75 mL (75 mLs Intravenous Contrast Given 09/26/23 1831)                                     Medical Decision Making Amount and/or Complexity of Data Reviewed Labs: ordered. Radiology: ordered.  Risk Prescription drug management.   Carlos Tapia is here with headache.  History of migraines.  Normal vitals.  No fever.  He supposed to see neurology next week.  He started on Topamax  and an injectable medicine here recently.  Overall he is very well-appearing.  CTA of the head and neck was ordered that showed no dissection or head bleed or brain mass.  Incidental thyroid  nodule.  He was made aware of this and have him follow-up with primary care about this.  Basic labs were done that showed no significant leukocytosis anemia or electrolyte abnormality.  I do think that this is likely migraine.  Will prescribe Fioricet for breakthrough headaches.  He was given Compazine Benadryl Decadron  here with some improvement.  Overall he has follow-up with neurology next month.  Told to return if symptoms worsen.  I have no concern for infectious process or other acute process at this time.  This chart was dictated using voice recognition software.  Despite best efforts to proofread,  errors can occur which can change the documentation meaning.      Final diagnoses:  Other migraine without status migrainosus, not intractable  Thyroid  nodule    ED Discharge Orders          Ordered    butalbital-acetaminophen -caffeine (FIORICET) 50-325-40 MG tablet  Every 6 hours PRN        09/26/23 2034               Ruthe Cornet, DO 09/26/23 2035

## 2023-09-26 NOTE — ED Triage Notes (Signed)
 Patient reports migraine that has lasted for several weeks. Reports dizziness and pain down neck. Has an appointment with neurology next month

## 2023-10-09 ENCOUNTER — Encounter (HOSPITAL_COMMUNITY): Payer: Self-pay | Admitting: Psychiatry

## 2023-10-09 ENCOUNTER — Telehealth (HOSPITAL_BASED_OUTPATIENT_CLINIC_OR_DEPARTMENT_OTHER): Payer: Self-pay | Admitting: Psychiatry

## 2023-10-09 VITALS — Wt 279.0 lb

## 2023-10-09 DIAGNOSIS — F319 Bipolar disorder, unspecified: Secondary | ICD-10-CM

## 2023-10-09 DIAGNOSIS — F5101 Primary insomnia: Secondary | ICD-10-CM | POA: Diagnosis not present

## 2023-10-09 DIAGNOSIS — F419 Anxiety disorder, unspecified: Secondary | ICD-10-CM

## 2023-10-09 DIAGNOSIS — F1210 Cannabis abuse, uncomplicated: Secondary | ICD-10-CM | POA: Diagnosis not present

## 2023-10-09 DIAGNOSIS — Z62819 Personal history of unspecified abuse in childhood: Secondary | ICD-10-CM

## 2023-10-09 MED ORDER — ARIPIPRAZOLE 5 MG PO TABS: 5.0000 mg | ORAL_TABLET | Freq: Every day | ORAL | 0 refills | Status: DC

## 2023-10-09 MED ORDER — GABAPENTIN 100 MG PO CAPS: 100.0000 mg | ORAL_CAPSULE | Freq: Two times a day (BID) | ORAL | 0 refills | Status: DC

## 2023-10-09 NOTE — Progress Notes (Signed)
 [facility] MD Virtual Progress Note   Patient Location: Home Provider Location: Home Office  I connect with patient by video and verified that I am speaking with correct person by using two identifiers. I discussed the limitations of evaluation and management by telemedicine and the availability of in person appointments. I also discussed with the patient that there may be a patient responsible charge related to this service. The patient expressed understanding and agreed to proceed.  Carlos Tapia [id number] 53 y.o.  10/09/2023 10:17 AM  History of Present Illness:  Patient is evaluated by video session.  He was last seen in February 2025.  He had missed appointment 2 months ago.  He admitted stop taking the medication because he was feeling better and felt that he does not need it anymore.  However past few weeks he has been experiencing a lot of anxiety, irritability, crying, emotional and feeling very sad and depressed.  He is unmotivated to do things.  His biggest concern is headaches.  He tried few medication and recently had a visit to the emergency room and he was given Fioricet.  Now he is scheduled to see neurologist.  His PCP prescribed Ativan  to help his anxiety which he takes half tablet only as needed.  He also stopped smoking marijuana 4 weeks ago.  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  He feels very nervous and anxious and do not sleep very well.  He has panic attacks.  Patient used to take Abilify  5 mg and gabapentin  100 mg at bedtime.  He reported his stress is also causing nightmares and flashback.  He reported neck pain and not sure if the neck pain causing the headaches.  He is now also interested to see a therapist.  He denies any hallucination, paranoia, suicidal thoughts but reported a lot of anxiety.  He had a good support from his wife.  Past  Psychiatric History: History of difficult childhood.  Involvement in gang crime and served jail time for 3 years and 3 years probation.  Never seen psychiatrist, counseling or any psychotropic medication.  No history of suicidal attempt.  History of mood swing, anger issues.  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  Smoked marijuana on a daily basis.  No history of DUI..  Hydroxyzine  10 mg but unclear if that cause insomnia.    Outpatient Encounter Medications as of 10/09/2023  Medication Sig   butalbital -acetaminophen -caffeine  (FIORICET) 50-325-40 MG tablet Take 1 tablet by mouth every 6 (six) hours as needed for up to 20 days for headache.   Cholecalciferol (VITAMIN D -3) 125 MCG (5000 UT) TABS Take 5,000 Units by mouth daily.   Creatine POWD Take 5 mg by mouth daily.   cyclobenzaprine  (FLEXERIL ) 5 MG tablet Take 1 tablet (5 mg total) by mouth at bedtime.   Galcanezumab -gnlm (EMGALITY , 300 MG DOSE,) 100 MG/ML SOSY Inject 300 mg into the skin every 30 (thirty) days.   LORazepam  (ATIVAN ) 0.5 MG tablet Take 1 tablet (0.5 mg total) by mouth 2 (two) times daily as needed for anxiety.   Multiple Vitamins-Minerals (MULTIVITAMIN WITH MINERALS) tablet Take 1 tablet by mouth daily.   topiramate  (TOPAMAX ) 25 MG tablet Take 1 tablet (25 mg total) by mouth 2 (two) times daily. Take 1/2 tablet twice a day for a week, then move up  to 1 tablet   No facility-administered encounter medications on file as of 10/09/2023.    Recent Results (from the past 2160 hours)  CBC w/Diff     Status: None   Collection Time: 09/14/23  2:30 PM  Result Value Ref Range   WBC 8.1 4.0 - 10.5 K/uL   RBC 5.32 4.22 - 5.81 Mil/uL   Hemoglobin 16.2 13.0 - 17.0 g/dL   HCT 51.4 60.9 - 47.9 %   MCV 91.2 78.0 - 100.0 fl   MCHC 33.4 30.0 - 36.0 g/dL   RDW 86.4 88.4 - 84.4 %   Platelets 245.0 150.0 - 400.0 K/uL   Neutrophils Relative % 61.0 43.0 - 77.0 %   Lymphocytes Relative 24.6 12.0 -  46.0 %   Monocytes Relative 9.3 3.0 - 12.0 %   Eosinophils Relative 4.5 0.0 - 5.0 %   Basophils Relative 0.6 0.0 - 3.0 %   Neutro Abs 4.9 1.4 - 7.7 K/uL   Lymphs Abs 2.0 0.7 - 4.0 K/uL   Monocytes Absolute 0.8 0.1 - 1.0 K/uL   Eosinophils Absolute 0.4 0.0 - 0.7 K/uL   Basophils Absolute 0.0 0.0 - 0.1 K/uL  Comp Met (CMET)     Status: None   Collection Time: 09/14/23  2:30 PM  Result Value Ref Range   Sodium 138 135 - 145 mEq/L   Potassium 4.1 3.5 - 5.1 mEq/L   Chloride 106 96 - 112 mEq/L   CO2 25 19 - 32 mEq/L   Glucose, Bld 89 70 - 99 mg/dL   BUN 17 6 - 23 mg/dL   Creatinine, Ser 9.08 0.40 - 1.50 mg/dL   Total Bilirubin 0.3 0.2 - 1.2 mg/dL   Alkaline Phosphatase 65 39 - 117 U/L   AST 20 0 - 37 U/L   ALT 24 0 - 53 U/L   Total Protein 7.2 6.0 - 8.3 g/dL   Albumin 4.6 3.5 - 5.2 g/dL   GFR 02.07 >39.99 mL/min    Comment: Calculated using the CKD-EPI Creatinine Equation (2021)   Calcium 9.4 8.4 - 10.5 mg/dL  CBC with Differential     Status: None   Collection Time: 09/26/23  8:02 PM  Result Value Ref Range   WBC 9.0 4.0 - 10.5 K/uL   RBC 5.32 4.22 - 5.81 MIL/uL   Hemoglobin 16.1 13.0 - 17.0 g/dL   HCT 51.9 60.9 - 47.9 %   MCV 90.2 80.0 - 100.0 fL   MCH 30.3 26.0 - 34.0 pg   MCHC 33.5 30.0 - 36.0 g/dL   RDW 86.9 88.4 - 84.4 %   Platelets 285 150 - 400 K/uL   nRBC 0.0 0.0 - 0.2 %   Neutrophils Relative % 58 %   Neutro Abs 5.3 1.7 - 7.7 K/uL   Lymphocytes Relative 26 %   Lymphs Abs 2.3 0.7 - 4.0 K/uL   Monocytes Relative 10 %   Monocytes Absolute 0.9 0.1 - 1.0 K/uL   Eosinophils Relative 4 %   Eosinophils Absolute 0.4 0.0 - 0.5 K/uL   Basophils Relative 1 %   Basophils Absolute 0.1 0.0 - 0.1 K/uL   Immature Granulocytes 1 %   Abs Immature Granulocytes 0.05 0.00 - 0.07 K/uL    Comment: Performed at [facility], [address] [place], [place] [address]  Basic metabolic panel     Status: None   Collection Time: 09/26/23  8:02 PM  Result Value Ref  Range   Sodium 138 135 -  145 mmol/L   Potassium 4.5 3.5 - 5.1 mmol/L    Comment: HEMOLYSIS AT THIS LEVEL MAY AFFECT RESULT   Chloride 106 98 - 111 mmol/L   CO2 23 22 - 32 mmol/L   Glucose, Bld 90 70 - 99 mg/dL    Comment: Glucose reference range applies only to samples taken after fasting for at least 8 hours.   BUN 14 6 - 20 mg/dL   Creatinine, Ser 9.16 0.61 - 1.24 mg/dL   Calcium 9.3 8.9 - 89.6 mg/dL   GFR, Estimated >39 >39 mL/min    Comment: (NOTE) Calculated using the CKD-EPI Creatinine Equation (2021)    Anion gap 9 5 - 15    Comment: Performed at [facility], [address] [place], [place] [address]     Psychiatric Specialty Exam: Physical Exam  Review of Systems  Musculoskeletal:  Positive for neck pain.  Neurological:  Positive for headaches.  Psychiatric/Behavioral:  Positive for dysphoric mood and sleep disturbance. The patient is nervous/anxious.     Weight 279 lb (126.6 kg).There is no height or weight on file to calculate BMI.  General Appearance: Casual  Eye Contact:  Fair  Speech:  Pressured  Volume:  Normal  Mood:  Anxious, Depressed, Dysphoric, and emotional  Affect:  Labile  Thought Process:  Descriptions of Associations: Intact  Orientation:  Full (Time, Place, and Person)  Thought Content:  Rumination  Suicidal Thoughts:  No  Homicidal Thoughts:  No  Memory:  Immediate;   Good Recent;   Good Remote;   Fair  Judgement:  Fair  Insight:  Shallow  Psychomotor Activity:  Increased  Concentration:  Concentration: Fair and Attention Span: Fair  Recall:  Good  Fund of Knowledge:  Good  Language:  Good  Akathisia:  No  Handed:  Right  AIMS (if indicated):     Assets:  Communication Skills Desire for Improvement Housing Social Support Talents/Skills Transportation  ADL's:  Intact  Cognition:  WNL  Sleep:  4 hrs       09/14/2023    1:39 PM 05/15/2022    3:26 PM 04/20/2022    1:34 PM 10/03/2021    1:46 PM 10/03/2021    1:29  PM  Depression screen PHQ 2/9  Decreased Interest 0 0 0 0 0  Down, Depressed, Hopeless 0 0 0 1 3  PHQ - 2 Score 0 0 0 1 3  Altered sleeping 0 3   3  Tired, decreased energy 2 3   3   Change in appetite 0 0   0  Feeling bad or failure about yourself  2 1   3   Trouble concentrating 0 0   3  Moving slowly or fidgety/restless 0 0   3  Suicidal thoughts 0 0   0  PHQ-9 Score 4 7   18   Difficult doing work/chores Not difficult at all Not difficult at all   Very difficult    Assessment/Plan: Bipolar I disorder (HCC) - Plan: ARIPiprazole  (ABILIFY ) 5 MG tablet, gabapentin  (NEURONTIN ) 100 MG capsule  Anxiety - Plan: ARIPiprazole  (ABILIFY ) 5 MG tablet, gabapentin  (NEURONTIN ) 100 MG capsule  Trauma in childhood - Plan: ARIPiprazole  (ABILIFY ) 5 MG tablet, gabapentin  (NEURONTIN ) 100 MG capsule  Primary insomnia - Plan: ARIPiprazole  (ABILIFY ) 5 MG tablet, gabapentin  (NEURONTIN ) 100 MG capsule  Mild tetrahydrocannabinol (THC) abuse - Plan: gabapentin  (NEURONTIN ) 100 MG capsule  Patient is 53 year old Caucasian man with history of coronary atherosclerosis, vestibular migraine, GERD, chronic back pain, neck pain, bipolar  disorder, anxiety and trauma.  His childhood now having severe anxiety and panic attacks.  Review blood work results and collateral information from other providers.  Patient is scheduled to see neurology for headaches.  He is taking Topamax  but does not feel it is working and helping.  Discussed to go back on the medication which was helping his mood irritability and nervousness.  Patient agreed with the plan.  Will start Abilify  5 mg daily.  He was also taking gabapentin  100 mg at bedtime and we will start but he will take twice a day.  Does not want to take the Ativan  frequently and I recommend to take half tablet as needed for severe panic attack.  He had a stopped cannabis use more than 30 days ago and like to stay away from the cannabis use in the future.  He also like to see a  therapist and we will refer him for therapy.  Discussed safety concerns at any time having active suicidal thoughts or homicidal thoughts any need to call 911 or go to local emergency room.  Will follow-up in 4 weeks.   Follow Up Instructions:     I discussed the assessment and treatment plan with the patient. The patient was provided an opportunity to ask questions and all were answered. The patient agreed with the plan and demonstrated an understanding of the instructions.   The patient was advised to call back or seek an in-person evaluation if the symptoms worsen or if the condition fails to improve as anticipated.    Collaboration of Care: Other provider involved in patient's care AEB notes are available in epic to review  Patient/Guardian was advised Release of Information must be obtained prior to any record release in order to collaborate their care with an outside provider. Patient/Guardian was advised if they have not already done so to contact the registration department to sign all necessary forms in order for us  to release information regarding their care.   Consent: Patient/Guardian gives verbal consent for treatment and assignment of benefits for services provided during this visit. Patient/Guardian expressed understanding and agreed to proceed.     Total encounter time 25 minutes which includes face-to-face time, chart reviewed, care coordination, order entry and documentation during this encounter.   Note: This document was prepared by [employer] voice dictation technology and any errors that results from this process are unintentional.    Leni ONEIDA Client, MD 10/09/2023

## 2023-10-11 ENCOUNTER — Other Ambulatory Visit: Payer: Self-pay | Admitting: Family Medicine

## 2023-10-11 ENCOUNTER — Ambulatory Visit: Admitting: Family Medicine

## 2023-10-11 DIAGNOSIS — G43809 Other migraine, not intractable, without status migrainosus: Secondary | ICD-10-CM

## 2023-10-11 DIAGNOSIS — G44021 Chronic cluster headache, intractable: Secondary | ICD-10-CM

## 2023-10-11 MED ORDER — EMGALITY 120 MG/ML ~~LOC~~ SOAJ: 120.0000 mg | SUBCUTANEOUS | 4 refills | Status: AC

## 2023-10-12 ENCOUNTER — Ambulatory Visit: Admitting: Internal Medicine

## 2023-10-12 ENCOUNTER — Encounter: Payer: Self-pay | Admitting: Internal Medicine

## 2023-10-12 VITALS — BP 134/82 | HR 89 | Temp 98.4°F | Ht 70.0 in | Wt 285.0 lb

## 2023-10-12 DIAGNOSIS — F32A Depression, unspecified: Secondary | ICD-10-CM

## 2023-10-12 DIAGNOSIS — E041 Nontoxic single thyroid nodule: Secondary | ICD-10-CM | POA: Diagnosis not present

## 2023-10-12 DIAGNOSIS — R030 Elevated blood-pressure reading, without diagnosis of hypertension: Secondary | ICD-10-CM | POA: Diagnosis not present

## 2023-10-12 DIAGNOSIS — R519 Headache, unspecified: Secondary | ICD-10-CM | POA: Diagnosis not present

## 2023-10-12 DIAGNOSIS — F419 Anxiety disorder, unspecified: Secondary | ICD-10-CM | POA: Diagnosis not present

## 2023-10-12 DIAGNOSIS — R739 Hyperglycemia, unspecified: Secondary | ICD-10-CM | POA: Diagnosis not present

## 2023-10-12 NOTE — Patient Instructions (Signed)
 We will need to finish the paperwork for pickup on Monday  Please continue all other medications as before, and refills have been done if requested.  Please have the pharmacy call with any other refills you may need.  Please keep your appointments with your specialists as you may have planned  - neurology next wk  You will be contacted regarding the referral for: Thyroid  ultrasound

## 2023-10-12 NOTE — Progress Notes (Unsigned)
 Patient ID: Carlos Tapia, male   DOB: [birth year], 53 y.o.   MRN: [id number]        Chief Complaint: follow up social anxiety, migraines, panic attacks, hyperglycemia, blood pressure       HPI:  Carlos Tapia is a 53 y.o. 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  Pt denies chest pain, increased sob or doe, wheezing, orthopnea, PND, increased LE swelling, palpitations, dizziness or syncope.   Pt denies polydipsia, polyuria, or new focal neuro s/s.   Has neurology appt july 17.   Denies worsening depression or SI or HI.  Wife with him for support today.  Needs work accomodation note to avoid public speaking.   Also incidentally, thryoid nodule noted on recent CT, will need further evaluation.  Has regular f/u with psychiatry planned Dr Curry.       Wt Readings from Last 3 Encounters:  10/12/23 285 lb (129.3 kg)  09/14/23 279 lb 12.8 oz (126.9 kg)  05/01/23 272 lb 12.8 oz (123.7 kg)   BP Readings from Last 3 Encounters:  10/12/23 134/82  09/26/23 (!) 128/92  09/14/23 (!) 138/90         Past Medical History:  Diagnosis Date   Adopted    per unsure of family medial history   Anxiety    Arthritis    Bipolar 1 disorder (HCC)    Coronary artery calcification    evaluted by cardiology-- dr maxwell   Depression    Hemorrhoids 2018   history gi bleed s/p colonoscopy   History of asthma    per pt no longer issue  has not uses inhaler for yrs since wt loss   Hypogonadism male    Lower urinary tract symptoms (LUTS)    Migraines    Mild tetrahydrocannabinol (THC) abuse    Phimosis    Prostatitis    Trauma in childhood    Vitamin D  deficiency    Past Surgical  History:  Procedure Laterality Date   CIRCUMCISION N/A 07/11/2022   Procedure: CIRCUMCISION ADULT;  Surgeon: Roseann Adine PARAS., MD;  Location: [facility];  Service: Urology;  Laterality: N/A;   COLONOSCOPY WITH PROPOFOL   01/05/2017   dr nandigam   ROBOT ASSISTED INGUINAL HERNIA REPAIR Left 05/08/2022   @ [facility]  by dr c. signe   SHOULDER ARTHROSCOPY W/ ROTATOR CUFF REPAIR Right 06/2020    reports that he quit smoking about 7 years ago. His smoking use included cigarettes. He started smoking about 37 years ago. He has a 22.5 pack-year smoking history. He has never used smokeless tobacco. He reports that he does not currently use alcohol. He reports current drug use. Frequency: 7.00 times per week. Drug: Marijuana. family history includes Heart disease in his mother. He was adopted. No Known Allergies Current Outpatient Medications on File Prior to Visit  Medication Sig Dispense Refill   ARIPiprazole  (ABILIFY ) 5 MG tablet Take 1 tablet (5 mg total) by mouth daily. 30 tablet 0   butalbital -acetaminophen -caffeine  (FIORICET) 50-325-40 MG tablet Take 1 tablet by mouth every 6 (six) hours as needed for up to 20 days for headache. 20 tablet 0   Cholecalciferol (VITAMIN  D-3) 125 MCG (5000 UT) TABS Take 5,000 Units by mouth daily.     gabapentin  (NEURONTIN ) 100 MG capsule Take 1 capsule (100 mg total) by mouth 2 (two) times daily. 60 capsule 0   Galcanezumab -gnlm (EMGALITY ) 120 MG/ML SOAJ Inject 120 mg into the skin every 28 (twenty-eight) days. 3 mL 4   LORazepam  (ATIVAN ) 0.5 MG tablet Take 1 tablet (0.5 mg total) by mouth 2 (two) times daily as needed for anxiety. 30 tablet 1   Multiple Vitamins-Minerals (MULTIVITAMIN WITH MINERALS) tablet Take 1 tablet by mouth daily.     topiramate  (TOPAMAX ) 25 MG tablet Take 1 tablet (25 mg total) by mouth 2 (two) times daily. Take 1/2 tablet twice a day for a week, then move up to 1 tablet 60 tablet 1   No current facility-administered medications  on file prior to visit.        ROS:  All others reviewed and negative.  Objective        PE:  BP 134/82 (BP Location: Left Arm, Patient Position: Sitting, Cuff Size: Normal)   Pulse 89   Temp 98.4 F (36.9 C) (Oral)   Ht 5' 10 (1.778 m)   Wt 285 lb (129.3 kg)   SpO2 98%   BMI 40.89 kg/m                 Constitutional: Pt appears in NAD               HENT: Head: NCAT.                Right Ear: External ear normal.                 Left Ear: External ear normal.                Eyes: . Pupils are equal, round, and reactive to light. Conjunctivae and EOM are normal               Nose: without d/c or deformity               Neck: Neck supple. Gross normal ROM               Cardiovascular: Normal rate and regular rhythm.                 Pulmonary/Chest: Effort normal and breath sounds without rales or wheezing.                               Neurological: Pt is alert. At baseline orientation, motor grossly intact               Skin: Skin is warm. No rashes, no other new lesions, LE edema - none               Psychiatric: Pt behavior is normal without agitation ; marked nervous  Micro: none  Cardiac tracings I have personally interpreted today:  none  Pertinent Radiological findings (summarize): none   Lab Results  Component Value Date   WBC 9.0 09/26/2023   HGB 16.1 09/26/2023   HCT 48.0 09/26/2023   PLT 285 09/26/2023   GLUCOSE 90 09/26/2023   CHOL 169 06/09/2022   TRIG 63.0 06/09/2022   HDL 47.20 06/09/2022   LDLCALC 109 (H) 06/09/2022   ALT 24 09/14/2023   AST 20 09/14/2023   NA 138 09/26/2023   K 4.5 09/26/2023  CL 106 09/26/2023   CREATININE 0.83 09/26/2023   BUN 14 09/26/2023   CO2 23 09/26/2023   TSH 1.03 06/09/2022   PSA 0.94 06/09/2022   HGBA1C 5.4 06/09/2022   Assessment/Plan:  Carlos Tapia is a 53 y.o. White or Caucasian [1] male with  has a past medical history of Adopted, Anxiety, Arthritis, Bipolar 1 disorder (HCC), Coronary artery calcification,  Depression, Hemorrhoids (2018), History of asthma, Hypogonadism male, Lower urinary tract symptoms (LUTS), Migraines, Mild tetrahydrocannabinol (THC) abuse, Phimosis, Prostatitis, Trauma in childhood, and Vitamin D  deficiency.  Anxiety and depression With recent worsening due to added social anxiety with promotion at work - ok for work accomodation  Frequent headaches Also with neurology f/u planned July 17 for intractable cluster headaches  Hyperglycemia Lab Results  Component Value Date   HGBA1C 5.4 06/09/2022   Stable, pt to continue current medical treatment  - diet , wt control   Blood pressure elevated without history of HTN BP Readings from Last 3 Encounters:  10/12/23 134/82  09/26/23 (!) 128/92  09/14/23 (!) 138/90   Stable, pt to continue medical treatment  - diet wt control  Thyroid  nodule 1.6 cm incidentally found on CT - for thyroid  u/s and possible FNA biopsy if abnormal  Followup: Return in about 6 months (around 04/13/2024).  Lynwood Rush, MD 10/13/2023 7:35 PM [facility]

## 2023-10-13 ENCOUNTER — Encounter: Payer: Self-pay | Admitting: Internal Medicine

## 2023-10-13 DIAGNOSIS — E041 Nontoxic single thyroid nodule: Secondary | ICD-10-CM | POA: Insufficient documentation

## 2023-10-13 NOTE — Assessment & Plan Note (Signed)
Lab Results  Component Value Date   HGBA1C 5.4 06/09/2022   Stable, pt to continue current medical treatment  - diet, wt control

## 2023-10-13 NOTE — Assessment & Plan Note (Addendum)
 Also with neurology f/u planned July 17 for intractable cluster headaches

## 2023-10-13 NOTE — Assessment & Plan Note (Signed)
 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

## 2023-10-13 NOTE — Assessment & Plan Note (Signed)
 BP Readings from Last 3 Encounters:  10/12/23 134/82  09/26/23 (!) 128/92  09/14/23 (!) 138/90   Stable, pt to continue medical treatment  - diet wt control

## 2023-10-13 NOTE — Assessment & Plan Note (Signed)
 1.6 cm incidentally found on CT - for thyroid  u/s and possible FNA biopsy if abnormal

## 2023-10-15 ENCOUNTER — Other Ambulatory Visit (HOSPITAL_COMMUNITY): Payer: Self-pay

## 2023-10-15 ENCOUNTER — Telehealth: Payer: Self-pay | Admitting: Internal Medicine

## 2023-10-15 ENCOUNTER — Telehealth: Payer: Self-pay

## 2023-10-15 NOTE — Telephone Encounter (Signed)
 Copied from CRM [id number]. Topic: Clinical - Medication Prior Auth >> Oct 15, 2023 10:39 AM Suzen RAMAN wrote: Reason for CRM: Patient would like a prior authorization submitted for Galcanezumab -gnlm (EMGALITY ) 120 MG/ML SOAJ.

## 2023-10-15 NOTE — Telephone Encounter (Signed)
 Pharmacy Patient Advocate Encounter  Received notification from [employer] that Prior Authorization for Emgality  120mg /ml has been APPROVED from 09/15/23 to 10/14/24   PA #/Case ID/Reference #: [id number]  Left a voicemail at [facility] to notify of the approval

## 2023-10-15 NOTE — Telephone Encounter (Signed)
 Pharmacy Patient Advocate Encounter   Received notification from Pt Calls Messages that prior authorization for Emgality  120mg /ml is required/requested.   Insurance verification completed.   The patient is insured through [employer] .   Per test claim: PA required; PA submitted to above mentioned insurance via CoverMyMeds Key/confirmation #/EOC [id number] Status is pending

## 2023-10-17 ENCOUNTER — Other Ambulatory Visit (HOSPITAL_COMMUNITY): Payer: Self-pay

## 2023-10-18 ENCOUNTER — Encounter: Payer: Self-pay | Admitting: Neurology

## 2023-10-18 ENCOUNTER — Encounter: Payer: Self-pay | Admitting: Internal Medicine

## 2023-10-18 ENCOUNTER — Ambulatory Visit: Admitting: Neurology

## 2023-10-18 VITALS — BP 137/95 | HR 70 | Resp 15 | Ht 70.0 in

## 2023-10-18 DIAGNOSIS — G44221 Chronic tension-type headache, intractable: Secondary | ICD-10-CM

## 2023-10-18 DIAGNOSIS — M542 Cervicalgia: Secondary | ICD-10-CM

## 2023-10-18 DIAGNOSIS — R0683 Snoring: Secondary | ICD-10-CM

## 2023-10-18 DIAGNOSIS — Z6841 Body Mass Index (BMI) 40.0 and over, adult: Secondary | ICD-10-CM

## 2023-10-18 MED ORDER — SUMATRIPTAN SUCCINATE 50 MG PO TABS: 50.0000 mg | ORAL_TABLET | Freq: Every day | ORAL | 11 refills | Status: DC | PRN

## 2023-10-18 NOTE — Progress Notes (Signed)
 [facility]  PATIENT: Carlos Tapia DOB: 07/18/1970  REQUESTING CLINICIAN: Norleen Lynwood ORN, MD HISTORY FROM: Patient  REASON FOR VISIT: Ongoing headaches    HISTORICAL  CHIEF COMPLAINT:  Chief Complaint  Patient presents with   Headache    Rm13, wife present, Ha: pt stated has daily headache for past 4 months. Pt stated that the topamax  makes him groggy and he wanted to know if could take propanolol instead. Pt stated that he had imaging done on spine and c-1&c-2 misaligned and wanted to know if it could be causing ha's   INTERVAL HISTORY 10/18/2023 Patient presents today for follow-up, she is accompanied by wife.  Last visit was a year ago at that time we have started him on sumatriptan  and amitriptyline .  He tells me that he continues to experience headaches.  He did follow-up with his PCP and lately was started on topiramate  and Emgality .  Since starting the Emgality  he feels the headache frequency has improved but he still experiences headache.  He did follow-up with chiropractor, was told there is a misalignment between his C1 and C2 which can be causing his headache.  He does complain of pain at the base of the head and wraps around to the front in a bandlike pressure manner, and also complaints of headaches.  No nausea or vomiting with the headaches.   HISTORY OF PRESENT ILLNESS:  This is a 53 year old gentleman past medical history of Bipolar disorder, anxiety/Depression who is presenting with complaints of headaches since the month of February.  Patient reports his symptoms started last December when he was having multiple syncope, he was found to have hernia, had operation and since then has been feeling lethargic, confused and not back to his normal self.  Since February he has been complaining of headaches, reports that the headaches are currently constant, he feels a pressure-like pain in the front between his eyes and on the temple and sharp aching pain in  the occipital region.  He initially thought he had a sinus infection, follow-up with ENT have sinus CTs and was told everything was normal and it was referred from ENT to neurology for further management of the headaches.  He reports as a child he did have migraines, where he will have to sit in a dark room.  He was not taking any preventive medication at that time.  Currently with his current headaches, he is not taking any over-the-counter medication.  Has not tried Tylenol  or ibuprofen .   Headache History and Characteristics: Onset: February Location: Frontal and occipital headaches  Quality:  Pressure in the front, temple and aching pain in the back, sometimes, hard to lift his head Intensity: 3-9/10.  Duration: Pain is constant but will increase in intensity  Migrainous Features: Photophobia, nausea.  Aura: No  History of brain injury or tumor: No  Family history: Unsure  Motion sickness: no Cardiac history: no  OTC: Excedrin Caffeine : yes  Sleep: 5 hrs a night  Mood/ Stress: High stress/irritable   Prior prophylaxis: Propranolol: No  Verapamil:No TCA: No Topamax : No Depakote: No Effexor: No Cymbalta: No Neurontin :No  Prior abortives: Triptan: No Anti-emetic: No Steroids: No Ergotamine suppository: No    OTHER MEDICAL CONDITIONS:  Bipolar disorder, anxiety/depression   REVIEW OF SYSTEMS: Full 14 system review of systems performed and negative with exception of: As noted in the HPI   ALLERGIES: No Known Allergies  HOME MEDICATIONS: Outpatient Medications Prior to Visit  Medication Sig Dispense Refill  ARIPiprazole  (ABILIFY ) 5 MG tablet Take 1 tablet (5 mg total) by mouth daily. 30 tablet 0   Cholecalciferol (VITAMIN D -3) 125 MCG (5000 UT) TABS Take 5,000 Units by mouth daily.     gabapentin  (NEURONTIN ) 100 MG capsule Take 1 capsule (100 mg total) by mouth 2 (two) times daily. (Patient taking differently: Take 100 mg by mouth at bedtime.) 60 capsule 0    Galcanezumab -gnlm (EMGALITY ) 120 MG/ML SOAJ Inject 120 mg into the skin every 28 (twenty-eight) days. 3 mL 4   LORazepam  (ATIVAN ) 0.5 MG tablet Take 1 tablet (0.5 mg total) by mouth 2 (two) times daily as needed for anxiety. 30 tablet 1   Multiple Vitamins-Minerals (MULTIVITAMIN WITH MINERALS) tablet Take 1 tablet by mouth daily.     topiramate  (TOPAMAX ) 25 MG tablet Take 1 tablet (25 mg total) by mouth 2 (two) times daily. Take 1/2 tablet twice a day for a week, then move up to 1 tablet (Patient taking differently: Take 25 mg by mouth at bedtime. Take 1/2 tablet twice a day for a week, then move up to 1 tablet) 60 tablet 1   SUMAtriptan  (IMITREX ) 50 MG tablet Take 50 mg by mouth daily as needed for migraine or headache (may repeat in 2 hours if migraine persists).     No facility-administered medications prior to visit.    PAST MEDICAL HISTORY: Past Medical History:  Diagnosis Date   Adopted    per unsure of family medial history   Anxiety    Arthritis    Bipolar 1 disorder (HCC)    Coronary artery calcification    evaluted by cardiology-- dr maxwell   Depression    Hemorrhoids 2018   history gi bleed s/p colonoscopy   History of asthma    per pt no longer issue  has not uses inhaler for yrs since wt loss   Hypogonadism male    Lower urinary tract symptoms (LUTS)    Migraines    Mild tetrahydrocannabinol (THC) abuse    Phimosis    Prostatitis    Trauma in childhood    Vitamin D  deficiency     PAST SURGICAL HISTORY: Past Surgical History:  Procedure Laterality Date   CIRCUMCISION N/A 07/11/2022   Procedure: CIRCUMCISION ADULT;  Surgeon: Roseann Adine PARAS., MD;  Location: [facility];  Service: Urology;  Laterality: N/A;   COLONOSCOPY WITH PROPOFOL   01/05/2017   dr shila   ROBOT ASSISTED INGUINAL HERNIA REPAIR Left 05/08/2022   @ [facility]  by dr c. signe   SHOULDER ARTHROSCOPY W/ ROTATOR CUFF REPAIR Right 06/2020    FAMILY HISTORY: Family History  Adopted:  Yes  Problem Relation Age of Onset   Heart disease Mother    Colon cancer Neg Hx     SOCIAL HISTORY: Social History   Socioeconomic History   Marital status: Married    Spouse name: Not on file   Number of children: 2   Years of education: 12   Highest education level: 12th grade  Occupational History   Occupation: Location manager  Tobacco Use   Smoking status: Former    Current packs/day: 0.00    Average packs/day: 0.8 packs/day for 30.0 years (22.5 ttl pk-yrs)    Types: Cigarettes    Start date: 03/30/1986    Quit date: 03/30/2016    Years since quitting: 7.5   Smokeless tobacco: Never  Vaping Use   Vaping status: Former   Devices: quit 03/ 2018  Substance and Sexual Activity  Alcohol use: Not Currently    Comment: rarely   Drug use: Yes    Frequency: 7.0 times per week    Types: Marijuana    Comment: smoking   Sexual activity: Yes    Birth control/protection: None  Other Topics Concern   Not on file  Social History Narrative   Fun: Video games   Denies religious beliefs effecting health care.    Right handed   Caffeine - a lot per patient   Social Drivers of Health   Financial Resource Strain: High Risk (04/30/2023)   Overall Financial Resource Strain (CARDIA)    Difficulty of Paying Living Expenses: Hard  Food Insecurity: Food Insecurity Present (04/30/2023)   Hunger Vital Sign    Worried About Running Out of Food in the Last Year: Sometimes true    Ran Out of Food in the Last Year: Never true  Transportation Needs: No Transportation Needs (04/30/2023)   PRAPARE - Administrator, Civil Service (Medical): No    Lack of Transportation (Non-Medical): No  Physical Activity: Insufficiently Active (04/30/2023)   Exercise Vital Sign    Days of Exercise per Week: 3 days    Minutes of Exercise per Session: 30 min  Stress: Stress Concern Present (04/30/2023)   [employer] of Occupational Health - Occupational Stress Questionnaire    Feeling of  Stress : Rather much  Social Connections: Socially Isolated (04/30/2023)   Social Connection and Isolation Panel    Frequency of Communication with Friends and Family: Never    Frequency of Social Gatherings with Friends and Family: Never    Attends Religious Services: Never    Database administrator or Organizations: No    Attends Engineer, structural: Not on file    Marital Status: Married  Catering manager Violence: Not on file    PHYSICAL EXAM   GENERAL EXAM/CONSTITUTIONAL: Vitals:  Vitals:   10/18/23 1436 10/18/23 1442  BP: (!) 131/92 (!) 137/95  Pulse: 70   Resp: 15   SpO2: 95%   Height: 5' 10 (1.778 m)    Body mass index is 40.89 kg/m. Wt Readings from Last 3 Encounters:  10/12/23 285 lb (129.3 kg)  09/14/23 279 lb 12.8 oz (126.9 kg)  05/01/23 272 lb 12.8 oz (123.7 kg)   Patient is in no distress; well developed, nourished and groomed; neck is supple  MUSCULOSKELETAL: Gait, strength, tone, movements noted in Neurologic exam below  NEUROLOGIC: MENTAL STATUS:      No data to display         awake, alert, oriented to person, place and time recent and remote memory intact normal attention and concentration language fluent, comprehension intact, naming intact fund of knowledge appropriate  CRANIAL NERVE:  2nd - no papilledema or hemorrhages on fundoscopic exam 2nd, 3rd, 4th, 6th - pupils equal and reactive to light, visual fields full to confrontation, extraocular muscles intact, no nystagmus 5th - facial sensation symmetric 7th - facial strength symmetric 8th - hearing intact 9th - palate elevates symmetrically, uvula midline 11th - shoulder shrug symmetric 12th - tongue protrusion midline  MOTOR:  normal bulk and tone, full strength in the BUE, BLE  SENSORY:  normal and symmetric to light touch  COORDINATION:  finger-nose-finger, fine finger movements normal  GAIT/STATION:  normal   DIAGNOSTIC DATA (LABS, IMAGING, TESTING) - I  reviewed patient records, labs, notes, testing and imaging myself where available.  Lab Results  Component Value Date   WBC 9.0 09/26/2023  HGB 16.1 09/26/2023   HCT 48.0 09/26/2023   MCV 90.2 09/26/2023   PLT 285 09/26/2023      Component Value Date/Time   NA 138 09/26/2023 2002   K 4.5 09/26/2023 2002   CL 106 09/26/2023 2002   CO2 23 09/26/2023 2002   GLUCOSE 90 09/26/2023 2002   BUN 14 09/26/2023 2002   CREATININE 0.83 09/26/2023 2002   CALCIUM 9.3 09/26/2023 2002   PROT 7.2 09/14/2023 1430   ALBUMIN 4.6 09/14/2023 1430   AST 20 09/14/2023 1430   ALT 24 09/14/2023 1430   ALKPHOS 65 09/14/2023 1430   BILITOT 0.3 09/14/2023 1430   GFRNONAA >60 09/26/2023 2002   Lab Results  Component Value Date   CHOL 169 06/09/2022   HDL 47.20 06/09/2022   LDLCALC 109 (H) 06/09/2022   TRIG 63.0 06/09/2022   CHOLHDL 4 06/09/2022   Lab Results  Component Value Date   HGBA1C 5.4 06/09/2022   Lab Results  Component Value Date   VITAMINB12 541 06/09/2022   Lab Results  Component Value Date   TSH 1.03 06/09/2022   MRI Brain 09/26/2023 Unremarkable MRI brain (with and without). No acute findings.   ASSESSMENT AND PLAN  53 y.o. year old male with history of bipolar disorder, anxiety/depression who is presenting with complaint of headaches.  Headaches are still present but improved since starting Emgality .  Patient tells me the sumatriptan  helped.  He was told that he has some misalignment in his neck causing his pain.  Will start by getting a cervical spine MRI.  Based on findings, we may recommend physical therapy. He does also report some morning headache at the time, loud sounds snoring and wife reports period of apnea, he has never had a sleep study.  Will refer him for sleep study.  His BMI is 41.  I will see him in 6 months for follow-up but he understand to contact me for updates.    1. Chronic tension-type headache, intractable   2. Snoring   3. Morbid obesity (HCC)    4. BMI 40.0-44.9, adult (HCC)   5. Cervicalgia      Patient Instructions  Continue with Emgality  as preventive medication with topiramate  Continue with sumatriptan  as needed Will refer patient to sleep neurology for evaluation of sleep apnea MRI Cervical spine, I will contact the patient with results Follow-up in 6 to 8 months or sooner if worse.   Orders Placed This Encounter  Procedures   MR CERVICAL SPINE WO CONTRAST   Ambulatory referral to Neurology    Meds ordered this encounter  Medications   SUMAtriptan  (IMITREX ) 50 MG tablet    Sig: Take 1 tablet (50 mg total) by mouth daily as needed for migraine or headache (may repeat in 2 hours if migraine persists).    Dispense:  10 tablet    Refill:  11    Return in about 6 months (around 04/19/2024).    Pastor Falling, MD 10/18/2023, 4:40 PM  [facility] [address] [place], [place] [address] [phone]

## 2023-10-18 NOTE — Patient Instructions (Signed)
 Continue with Emgality  as preventive medication with topiramate  Continue with sumatriptan  as needed Will refer patient to sleep neurology for evaluation of sleep apnea MRI Cervical spine, I will contact the patient with results Follow-up in 6 to 8 months or sooner if worse.

## 2023-10-19 ENCOUNTER — Ambulatory Visit
Admission: RE | Admit: 2023-10-19 | Discharge: 2023-10-19 | Disposition: A | Discharge status: Home / Self Care | Source: Ambulatory Visit | Attending: Internal Medicine | Admitting: Internal Medicine

## 2023-10-19 DIAGNOSIS — E041 Nontoxic single thyroid nodule: Secondary | ICD-10-CM

## 2023-10-22 NOTE — Telephone Encounter (Signed)
 Copied from CRM [id number]. Topic: General - Other >> Oct 22, 2023 11:40 AM Thersia BROCKS wrote: Reason for CRM: Patient wife called in regarding a missed call, would like a callback

## 2023-10-23 NOTE — Telephone Encounter (Signed)
 Spoke with patients spouse, forms already picked up. She questioned if his US  report was back, it is not back yet. Advised her to reach back out if it is not back by Friday. Gave verbal understanding

## 2023-10-24 ENCOUNTER — Other Ambulatory Visit

## 2023-10-24 DIAGNOSIS — M542 Cervicalgia: Secondary | ICD-10-CM

## 2023-10-26 ENCOUNTER — Ambulatory Visit: Payer: Self-pay | Admitting: Neurology

## 2023-10-26 DIAGNOSIS — M542 Cervicalgia: Secondary | ICD-10-CM

## 2023-10-26 DIAGNOSIS — G44221 Chronic tension-type headache, intractable: Secondary | ICD-10-CM

## 2023-10-27 ENCOUNTER — Ambulatory Visit: Payer: Self-pay | Admitting: Internal Medicine

## 2023-10-27 ENCOUNTER — Other Ambulatory Visit: Payer: Self-pay | Admitting: Neurology

## 2023-10-29 ENCOUNTER — Other Ambulatory Visit: Payer: Self-pay | Admitting: Family Medicine

## 2023-10-29 DIAGNOSIS — G43809 Other migraine, not intractable, without status migrainosus: Secondary | ICD-10-CM

## 2023-10-29 DIAGNOSIS — G44021 Chronic cluster headache, intractable: Secondary | ICD-10-CM

## 2023-10-31 ENCOUNTER — Telehealth: Payer: Self-pay | Admitting: Neurology

## 2023-10-31 NOTE — Telephone Encounter (Signed)
Referral for physical therapy sent through [facility] to [facility]. Phone: [phone].

## 2023-11-03 ENCOUNTER — Other Ambulatory Visit (HOSPITAL_COMMUNITY): Payer: Self-pay | Admitting: Psychiatry

## 2023-11-03 DIAGNOSIS — F5101 Primary insomnia: Secondary | ICD-10-CM

## 2023-11-03 DIAGNOSIS — F419 Anxiety disorder, unspecified: Secondary | ICD-10-CM

## 2023-11-03 DIAGNOSIS — Z62819 Personal history of unspecified abuse in childhood: Secondary | ICD-10-CM

## 2023-11-03 DIAGNOSIS — F319 Bipolar disorder, unspecified: Secondary | ICD-10-CM

## 2023-11-05 ENCOUNTER — Telehealth (HOSPITAL_BASED_OUTPATIENT_CLINIC_OR_DEPARTMENT_OTHER): Payer: Self-pay | Admitting: Psychiatry

## 2023-11-05 ENCOUNTER — Encounter (HOSPITAL_COMMUNITY): Payer: Self-pay | Admitting: Psychiatry

## 2023-11-05 VITALS — Wt 285.0 lb

## 2023-11-05 DIAGNOSIS — F4010 Social phobia, unspecified: Secondary | ICD-10-CM

## 2023-11-05 DIAGNOSIS — F319 Bipolar disorder, unspecified: Secondary | ICD-10-CM | POA: Diagnosis not present

## 2023-11-05 DIAGNOSIS — Z62819 Personal history of unspecified abuse in childhood: Secondary | ICD-10-CM | POA: Diagnosis not present

## 2023-11-05 DIAGNOSIS — F5101 Primary insomnia: Secondary | ICD-10-CM

## 2023-11-05 DIAGNOSIS — F1210 Cannabis abuse, uncomplicated: Secondary | ICD-10-CM

## 2023-11-05 DIAGNOSIS — F411 Generalized anxiety disorder: Secondary | ICD-10-CM

## 2023-11-05 MED ORDER — PROPRANOLOL HCL 20 MG PO TABS: 20.0000 mg | ORAL_TABLET | Freq: Two times a day (BID) | ORAL | 0 refills | Status: DC

## 2023-11-05 MED ORDER — GABAPENTIN 100 MG PO CAPS: 100.0000 mg | ORAL_CAPSULE | Freq: Two times a day (BID) | ORAL | 0 refills | Status: DC

## 2023-11-05 MED ORDER — ARIPIPRAZOLE 5 MG PO TABS: 5.0000 mg | ORAL_TABLET | Freq: Every day | ORAL | 0 refills | Status: DC

## 2023-11-05 NOTE — Progress Notes (Signed)
 [facility] MD Virtual Progress Note   Patient Location: Home Provider Location: Home office  I connect with patient by video and verified that I am speaking with correct person by using two identifiers. I discussed the limitations of evaluation and management by telemedicine and the availability of in person appointments. I also discussed with the patient that there may be a patient responsible charge related to this service. The patient expressed understanding and agreed to proceed.  Carlos Tapia [id number] 53 y.o.  11/05/2023 1:03 PM  History of Present Illness:  Patient is evaluated by video session.  He is now taking Abilify  5 mg.  He reported his mood symptoms are much better and he does not feel manic and denies any irritability, anger, depression or any crying spells.  However he continued to endorse a lot of anxiety and nervousness specially in public places.  Recently he talked to his PCP and job accommodation letter helps that he does not have to do presentation but he do not know how long it works.  He has a lot of pressure for presentation.  He reported a lot of social anxiety in public and when he feels anxious he started to have a lot of headaches.  He reported increased heart rate, palpitation.  His neurologist recommended physical therapy for his neck stiffness and headaches.  He is going to start soon.  He is taking Topamax , Emgality  but sometimes he feels the headaches are not under control.  His PCP prescribed which sometimes works but he is only taking half tablet and does not have anymore.  He is taking gabapentin  200 mg at bedtime which is helping his sleep but higher doses make him very groggy and sleepy.  He reported family life is good.  Denies any hallucination, paranoia or any suicidal thoughts.  He admitted weight gain because is not active but just recently started to have a goal for 10,000 steps every day.  He is doing at least 3 to 4 days a week 10,000  steps.  Elect Abilify  because it is helping his emotions but anxiety has been challenged.  We have referred him to see a therapist but patient has not able to connect with therapist.  He has history of trauma in his life.  Denies any nightmares or flashback but is more anxious about the future.  He worried about finances, health and around people.  He had a good support from his wife.  He denies drinking or using any illegal substances.  Past Psychiatric History: History of difficult childhood.  Involvement in gang crime and served jail time for 3 years and 3 years probation.  Never seen psychiatrist, counseling or any psychotropic medication.  No history of suicidal attempt.  History of mood swing, anger issues.  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  Smoked marijuana on a daily basis.  No history of DUI..  Hydroxyzine  10 mg but unclear if that cause insomnia.   Past Medical History:  Diagnosis Date   Adopted    per unsure of family medial history   Anxiety    Arthritis    Bipolar 1 disorder (HCC)    Coronary artery calcification    evaluted by cardiology-- dr maxwell   Depression    Hemorrhoids 2018   history gi bleed s/p colonoscopy   History of asthma    per pt no longer issue  has not uses inhaler for yrs since wt loss  Hypogonadism male    Lower urinary tract symptoms (LUTS)    Migraines    Mild tetrahydrocannabinol (THC) abuse    Phimosis    Prostatitis    Trauma in childhood    Vitamin D  deficiency     Outpatient Encounter Medications as of 11/05/2023  Medication Sig   ARIPiprazole  (ABILIFY ) 5 MG tablet Take 1 tablet (5 mg total) by mouth daily.   Cholecalciferol (VITAMIN D -3) 125 MCG (5000 UT) TABS Take 5,000 Units by mouth daily.   gabapentin  (NEURONTIN ) 100 MG capsule Take 1 capsule (100 mg total) by mouth 2 (two) times daily. (Patient taking differently: Take 100 mg by mouth at bedtime.)   Galcanezumab -gnlm (EMGALITY ) 120  MG/ML SOAJ Inject 120 mg into the skin every 28 (twenty-eight) days.   LORazepam  (ATIVAN ) 0.5 MG tablet Take 1 tablet (0.5 mg total) by mouth 2 (two) times daily as needed for anxiety.   Multiple Vitamins-Minerals (MULTIVITAMIN WITH MINERALS) tablet Take 1 tablet by mouth daily.   SUMAtriptan  (IMITREX ) 50 MG tablet TAKE ONE TABLET BY MOUTH AT ONSET OF MIGRAINE. IF SYMPTOMS PERSIST, A SECOND DOSE MAY BE TAKEN IN 2 HOURS.   topiramate  (TOPAMAX ) 25 MG tablet Take 1 tablet (25 mg total) by mouth 2 (two) times daily.   No facility-administered encounter medications on file as of 11/05/2023.    Recent Results (from the past 2160 hours)  CBC w/Diff     Status: None   Collection Time: 09/14/23  2:30 PM  Result Value Ref Range   WBC 8.1 4.0 - 10.5 K/uL   RBC 5.32 4.22 - 5.81 Mil/uL   Hemoglobin 16.2 13.0 - 17.0 g/dL   HCT 51.4 60.9 - 47.9 %   MCV 91.2 78.0 - 100.0 fl   MCHC 33.4 30.0 - 36.0 g/dL   RDW 86.4 88.4 - 84.4 %   Platelets 245.0 150.0 - 400.0 K/uL   Neutrophils Relative % 61.0 43.0 - 77.0 %   Lymphocytes Relative 24.6 12.0 - 46.0 %   Monocytes Relative 9.3 3.0 - 12.0 %   Eosinophils Relative 4.5 0.0 - 5.0 %   Basophils Relative 0.6 0.0 - 3.0 %   Neutro Abs 4.9 1.4 - 7.7 K/uL   Lymphs Abs 2.0 0.7 - 4.0 K/uL   Monocytes Absolute 0.8 0.1 - 1.0 K/uL   Eosinophils Absolute 0.4 0.0 - 0.7 K/uL   Basophils Absolute 0.0 0.0 - 0.1 K/uL  Comp Met (CMET)     Status: None   Collection Time: 09/14/23  2:30 PM  Result Value Ref Range   Sodium 138 135 - 145 mEq/L   Potassium 4.1 3.5 - 5.1 mEq/L   Chloride 106 96 - 112 mEq/L   CO2 25 19 - 32 mEq/L   Glucose, Bld 89 70 - 99 mg/dL   BUN 17 6 - 23 mg/dL   Creatinine, Ser 9.08 0.40 - 1.50 mg/dL   Total Bilirubin 0.3 0.2 - 1.2 mg/dL   Alkaline Phosphatase 65 39 - 117 U/L   AST 20 0 - 37 U/L   ALT 24 0 - 53 U/L   Total Protein 7.2 6.0 - 8.3 g/dL   Albumin 4.6 3.5 - 5.2 g/dL   GFR 02.07 >39.99 mL/min    Comment: Calculated using the CKD-EPI  Creatinine Equation (2021)   Calcium 9.4 8.4 - 10.5 mg/dL  CBC with Differential     Status: None   Collection Time: 09/26/23  8:02 PM  Result Value Ref Range   WBC 9.0  4.0 - 10.5 K/uL   RBC 5.32 4.22 - 5.81 MIL/uL   Hemoglobin 16.1 13.0 - 17.0 g/dL   HCT 51.9 60.9 - 47.9 %   MCV 90.2 80.0 - 100.0 fL   MCH 30.3 26.0 - 34.0 pg   MCHC 33.5 30.0 - 36.0 g/dL   RDW 86.9 88.4 - 84.4 %   Platelets 285 150 - 400 K/uL   nRBC 0.0 0.0 - 0.2 %   Neutrophils Relative % 58 %   Neutro Abs 5.3 1.7 - 7.7 K/uL   Lymphocytes Relative 26 %   Lymphs Abs 2.3 0.7 - 4.0 K/uL   Monocytes Relative 10 %   Monocytes Absolute 0.9 0.1 - 1.0 K/uL   Eosinophils Relative 4 %   Eosinophils Absolute 0.4 0.0 - 0.5 K/uL   Basophils Relative 1 %   Basophils Absolute 0.1 0.0 - 0.1 K/uL   Immature Granulocytes 1 %   Abs Immature Granulocytes 0.05 0.00 - 0.07 K/uL    Comment: Performed at [facility], [address], [place], [place] [address]  Basic metabolic panel     Status: None   Collection Time: 09/26/23  8:02 PM  Result Value Ref Range   Sodium 138 135 - 145 mmol/L   Potassium 4.5 3.5 - 5.1 mmol/L    Comment: HEMOLYSIS AT THIS LEVEL MAY AFFECT RESULT   Chloride 106 98 - 111 mmol/L   CO2 23 22 - 32 mmol/L   Glucose, Bld 90 70 - 99 mg/dL    Comment: Glucose reference range applies only to samples taken after fasting for at least 8 hours.   BUN 14 6 - 20 mg/dL   Creatinine, Ser 9.16 0.61 - 1.24 mg/dL   Calcium 9.3 8.9 - 89.6 mg/dL   GFR, Estimated >39 >39 mL/min    Comment: (NOTE) Calculated using the CKD-EPI Creatinine Equation (2021)    Anion gap 9 5 - 15    Comment: Performed at [facility], [address], [place], [place] [address]     Psychiatric Specialty Exam: Physical Exam  Review of Systems  Musculoskeletal:  Positive for neck pain.  Psychiatric/Behavioral:  The patient is nervous/anxious.     Weight 285 lb (129.3 kg).There is no height or weight  on file to calculate BMI.  General Appearance: Casual  Eye Contact:  Fair  Speech:  Normal Rate  Volume:  Normal  Mood:  Anxious  Affect:  Appropriate  Thought Process:  Goal Directed  Orientation:  Full (Time, Place, and Person)  Thought Content:  Rumination  Suicidal Thoughts:  No  Homicidal Thoughts:  No  Memory:  Immediate;   Good Recent;   Good Remote;   Fair  Judgement:  Intact  Insight:  Present  Psychomotor Activity:  Normal  Concentration:  Concentration: Good and Attention Span: Good  Recall:  Good  Fund of Knowledge:  Good  Language:  Good  Akathisia:  No  Handed:  Right  AIMS (if indicated):     Assets:  Communication Skills Desire for Improvement Housing Talents/Skills Transportation  ADL's:  Intact  Cognition:  WNL  Sleep:  fair       09/14/2023    1:39 PM 05/15/2022    3:26 PM 04/20/2022    1:34 PM 10/03/2021    1:46 PM 10/03/2021    1:29 PM  Depression screen PHQ 2/9  Decreased Interest 0 0 0 0 0  Down, Depressed, Hopeless 0 0 0 1 3  PHQ - 2  Score 0 0 0 1 3  Altered sleeping 0 3   3  Tired, decreased energy 2 3   3   Change in appetite 0 0   0  Feeling bad or failure about yourself  2 1   3   Trouble concentrating 0 0   3  Moving slowly or fidgety/restless 0 0   3  Suicidal thoughts 0 0   0  PHQ-9 Score 4 7   18   Difficult doing work/chores Not difficult at all Not difficult at all   Very difficult    Assessment/Plan: Social anxiety disorder - Plan: propranolol  (INDERAL ) 20 MG tablet  Bipolar I disorder (HCC) - Plan: ARIPiprazole  (ABILIFY ) 5 MG tablet, gabapentin  (NEURONTIN ) 100 MG capsule  Trauma in childhood - Plan: ARIPiprazole  (ABILIFY ) 5 MG tablet, gabapentin  (NEURONTIN ) 100 MG capsule  Primary insomnia - Plan: ARIPiprazole  (ABILIFY ) 5 MG tablet, gabapentin  (NEURONTIN ) 100 MG capsule  GAD (generalized anxiety disorder) - Plan: propranolol  (INDERAL ) 20 MG tablet  Mild tetrahydrocannabinol (THC) abuse - Plan: gabapentin  (NEURONTIN ) 100 MG  capsule  Review collateral information from other providers and current medication.  Reported mood symptoms are better but is still struggle with anxiety.  He is no longer smoking marijuana.  He is wondering about Ativan  however I explained benzodiazepine can be given for emergency basis due to dependency issue.  I recommend to trial low-dose Inderal  to help with his anxiety especially in public and may help his headaches.  He is willing to try.  If Inderal  did not help him then will consider low-dose Ativan  as a standing dose.  For now continue Abilify  5 mg daily, gabapentin  200 mg at bedtime.  Recommend not to take the Ativan  with the propranolol .  Discussed that Inderal  is antihypertensive medication so he will need to monitor his blood pressure and if he has dizziness after taking the medication that he need to stop.  Patient will also start physical therapy for her neck pain.  We will again refer him to see a therapist to deal with his trauma which happened in his previous life.  Will follow-up in 4 weeks.  Continue Abilify  5 mg.   Follow Up Instructions:     I discussed the assessment and treatment plan with the patient. The patient was provided an opportunity to ask questions and all were answered. The patient agreed with the plan and demonstrated an understanding of the instructions.   The patient was advised to call back or seek an in-person evaluation if the symptoms worsen or if the condition fails to improve as anticipated.    Collaboration of Care: Other provider involved in patient's care AEB notes are available in epic to review  Patient/Guardian was advised Release of Information must be obtained prior to any record release in order to collaborate their care with an outside provider. Patient/Guardian was advised if they have not already done so to contact the registration department to sign all necessary forms in order for us  to release information regarding their care.   Consent:  Patient/Guardian gives verbal consent for treatment and assignment of benefits for services provided during this visit. Patient/Guardian expressed understanding and agreed to proceed.     Total encounter time 27 minutes which includes face-to-face time, chart reviewed, care coordination, order entry and documentation during this encounter.   Note: This document was prepared by [employer] voice dictation technology and any errors that results from this process are unintentional.    Leni ONEIDA Client, MD 11/05/2023

## 2023-11-15 NOTE — Telephone Encounter (Signed)
 Unable to LVM for team member regarding consent form.

## 2023-11-16 ENCOUNTER — Encounter: Payer: Self-pay | Admitting: Physical Therapy

## 2023-11-16 ENCOUNTER — Ambulatory Visit: Attending: Neurology | Admitting: Physical Therapy

## 2023-11-16 VITALS — BP 108/72 | HR 70

## 2023-11-16 DIAGNOSIS — G44221 Chronic tension-type headache, intractable: Secondary | ICD-10-CM | POA: Insufficient documentation

## 2023-11-16 DIAGNOSIS — R293 Abnormal posture: Secondary | ICD-10-CM | POA: Diagnosis not present

## 2023-11-16 DIAGNOSIS — M542 Cervicalgia: Secondary | ICD-10-CM | POA: Insufficient documentation

## 2023-11-16 NOTE — Therapy (Signed)
 OUTPATIENT PHYSICAL THERAPY CERVICAL EVALUATION   Patient Name: Carlos Tapia MRN: [id number] DOB:10/28/1970, 53 y.o., male Today's Date: 11/16/2023  END OF SESSION:  PT End of Session - 11/16/23 1447     Visit Number 1    Number of Visits 5    Date for PT Re-Evaluation 12/21/23    Authorization Type BLUE CROSS BLUE [employer]IELD    PT Start Time 1446    PT Stop Time 1520    PT Time Calculation (min) 34 min    Activity Tolerance Patient tolerated treatment well    Behavior During Therapy WFL for tasks assessed/performed          Past Medical History:  Diagnosis Date   Adopted    per unsure of family medial history   Anxiety    Arthritis    Bipolar 1 disorder (HCC)    Coronary artery calcification    evaluted by cardiology-- dr maxwell   Depression    Hemorrhoids 2018   history gi bleed s/p colonoscopy   History of asthma    per pt no longer issue  has not uses inhaler for yrs since wt loss   Hypogonadism male    Lower urinary tract symptoms (LUTS)    Migraines    Mild tetrahydrocannabinol (THC) abuse    Phimosis    Prostatitis    Trauma in childhood    Vitamin D deficiency    Past Surgical History:  Procedure Laterality Date   CIRCUMCISION N/A 07/11/2022   Procedure: CIRCUMCISION ADULT;  Surgeon: Roseann Adine PARAS., MD;  Location: [facility];  Service: Urology;  Laterality: N/A;   COLONOSCOPY WITH PROPOFOL  01/05/2017   dr nandigam   ROBOT ASSISTED INGUINAL HERNIA REPAIR Left 05/08/2022   @ [facility]  by dr c. signe   SHOULDER ARTHROSCOPY W/ ROTATOR CUFF REPAIR Right 06/2020   Patient Active Problem List   Diagnosis Date Noted   Thyroid nodule 10/13/2023   Body mass index (BMI) of 40.0-44.9 in adult [facility]) 09/15/2023   Intractable chronic cluster headache 09/15/2023   Lightheadedness 09/15/2023   Vestibular migraine 09/15/2023   GAD (generalized anxiety disorder) 09/15/2023   Frequent headaches 09/12/2022   Tinnitus aurium, bilateral 09/07/2022    Prostatitis 06/15/2022   Balanitis 06/09/2022   Phimosis 06/09/2022   Dysuria 06/09/2022   Non-recurrent unilateral inguinal hernia without obstruction or gangrene 04/20/2022   Coronary atherosclerosis 04/20/2022   Syncope and collapse 04/17/2022   Proctalgia fugax 04/17/2022   Abdominal pain 04/17/2022   Hyperglycemia 10/03/2021   HLD (hyperlipidemia) 10/03/2021   Low testosterone in male 10/03/2021   Encounter for well adult exam with abnormal findings 10/03/2021   Anxiety and depression 10/03/2021   Sleep apnea in adult 04/08/2020   GERD (gastroesophageal reflux disease) 04/08/2020   Chronic right shoulder pain 04/08/2020   Left lumbar radiculopathy 06/10/2019   Ulnar neuritis, left 03/06/2019   Allergic rhinitis 01/21/2019   Asthma 01/21/2019   Rupture of plantaris tendon, left, initial encounter 03/15/2017   Hypersomnolence 11/28/2016   Hematochezia 11/28/2016   Morbid obesity (HCC) 09/06/2015   Acute sinusitis 05/19/2015   Insomnia 08/19/2014   Fatigue 08/19/2014   Blood pressure elevated without history of HTN 08/19/2014    PCP:  Norleen Lynwood ORN, MD   REFERRING PROVIDER: Gregg Lek, MD  REFERRING DIAG: M54.2 (ICD-10-CM) - Cervicalgia [phone] (ICD-10-CM) - Chronic tension-type headache, intractable  THERAPY DIAG:  Cervicalgia  Abnormal posture  Rationale for Evaluation and Treatment: Rehabilitation  ONSET DATE: 10/31/2023  SUBJECTIVE:                                                                                                                                                                                                         SUBJECTIVE STATEMENT: Has been having pretty bad migraines, has been going on for 2 years. Hurt it more on the R side. Would pass out of nowhere and then would have dizziness. Went to see a Land and reports that C1/C2 are out of alignment. Reports that they gently moved it back into place, reports that the dizziness has gone  away and that has subsided since 3 months. Still gets the pressure in his head from his migraines. Feels like head is hard to hold up sometimes and feels unstable, sometimes gets tightness at the base of his skull. Currently as Emgality and topamax. Notes it has definitely gotten less since starting the medications. Not having any pain and just having a gnawing pain at the base of his skull and just has some tightness on the R side of his neck.  Hand dominance: Right  PERTINENT HISTORY:  PMH: Bipolar disorder, anxiety/Depression, migraines   Per neurologist: Since February he has been complaining of headaches, reports that the headaches are currently constant, he feels a pressure-like pain in the front between his eyes and on the temple and sharp aching pain in the occipital region. He initially thought he had a sinus infection, follow-up with ENT have sinus CTs and was told everything was normal   PAIN:  Are you having pain? 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 Buck on 11/21/23  OBJECTIVE:  Note: Objective measures were completed at Evaluation unless otherwise noted.  DIAGNOSTIC FINDINGS:  MRI cervical spine 10/24/23:  MRI scan cervical spine without contrast showing mild disc and facet degenerative changes throughout most prominent at C4-5 where there is focal central disc protrusion with no significant compression.   PATIENT SURVEYS:  Headache Disability Index: 58/100 = Severe disability in regards to headaches    COGNITION: Overall cognitive status: Within functional limits for tasks assessed  SENSATION: Pt denies numbness/tingling  POSTURE: rounded shoulders and forward head  PALPATION: Incr TTP R >L suboccipitals, R cervical paraspinals, R  upper trap and levator scap    CERVICAL ROM:  Active ROM A/PROM (deg) eval  Flexion 40, feels tightness  Extension 45, gets pressure in back of head  Right lateral flexion 40  Left lateral flexion 43, feels incr tightness in R side of neck  Right rotation 65  Left rotation 60, feels tighter going to this side   (Blank rows = not tested)   UPPER EXTREMITY AROM  AROM Right eval Left eval  Shoulder flexion WNL WNL  Shoulder extension    Shoulder abduction WNL WNL  Shoulder adduction    Shoulder extension    Shoulder internal rotation WNL WNL  Shoulder external rotation WNL WNL  Middle trapezius    Lower trapezius    Elbow flexion    Elbow extension    Wrist flexion    Wrist extension    Wrist ulnar deviation    Wrist radial deviation    Wrist pronation    Wrist supination    Grip strength     (Blank rows = not tested)  Pt reports feeling more tightness on R side    TREATMENT DATE: 11/16/23                                                                                                                                Self-Care: Discussed dry needling and purpose in regards to incr tightness/trigger points, esp in suboccipitals. Pt willing to try, will get pt scheduled on certified dry needling therapist  Showed pt Chirp Wheel to use on suboccipitals for gentle release, pt trialed for ~1-2 minutes and reporting feeling good with this and wanted to purchase one. Showed pt where to obtain from [facility]  Access Code: [email or web address] URL: [email or web address] Date: 11/16/2023 Prepared by: Sheffield Senate  Initiated HEP for gentle neck stretching:   Exercises - Seated Upper Trapezius Stretch  - 2 x daily - 7 x weekly - 3 sets - 30 hold - Gentle Levator Scapulae Stretch  - 2 x daily - 7 x weekly - 3 sets - 30 hold  PATIENT EDUCATION:  Education details: See above, initial HEP, POC Person educated: Patient Education method: Explanation, Demonstration, Verbal  cues, and Handouts Education comprehension: verbalized understanding and returned demonstration  HOME EXERCISE PROGRAM: Access Code: [email or web address] URL: [email or web address] Date: 11/16/2023 Prepared by: Sheffield Senate  Exercises - Seated Upper Trapezius Stretch  - 2 x daily - 7 x weekly - 3 sets - 30 hold - Gentle Levator Scapulae Stretch  - 2 x daily - 7 x weekly - 3 sets - 30 hold  ASSESSMENT:  CLINICAL IMPRESSION: Patient is a 53 year old male referred to Neuro OPPT for cervicalgia/headaches.   Pt's PMH is significant for: Bipolar disorder, anxiety/Depression, migraines  . The following deficits were present during the exam: decr cervical AROM with tightness, incr TTP to R>L suboccipitals, upper trap, levator scap and cervical paraspinals, impaired posture. Based on headache disability index, pt with severe disability in regards to headaches.  Pt would benefit from  skilled PT to address these impairments and functional limitations to improve mobility and decr headaches    OBJECTIVE IMPAIRMENTS: decreased mobility, decreased ROM, hypomobility, increased fascial restrictions, increased muscle spasms, impaired flexibility, and postural dysfunction.   ACTIVITY LIMITATIONS: headaches affect his activity in daily life  PARTICIPATION LIMITATIONS: headaches affect participation in daily life   PERSONAL FACTORS: Behavior pattern, Past/current experiences, Time since onset of injury/illness/exacerbation, and 3+ comorbidities:  Bipolar disorder, anxiety/Depression, migraines  are also affecting patient's functional outcome.   REHAB POTENTIAL: Good  CLINICAL DECISION MAKING: Stable/uncomplicated  EVALUATION COMPLEXITY: Low   GOALS: Goals reviewed with patient? Yes  SHORT TERM GOALS: ALL STGS = LTGS  LONG TERM GOALS: Target date: 12/21/2023   Pt will be independent with final HEP for neck ROM and posture strengthening to help with headches. Baseline:  Goal status:  INITIAL  2.  Pt will decr Headache disability index to 29 or less in order to demo decr disability with headaches.  Baseline: 58 = severe disability  Goal status: INITIAL  3.  Pt will improve cervical rotation AROM to at lest 70 degrees bilat to demo improved AROM for daily life.  Baseline:  Right rotation 65  Left rotation 60, feels tighter going to this side   Goal status: INITIAL  4.  Pt will improve cervical flexion AROM to at least 60 deg to demo improved mobility  Baseline:  Flexion 40, feels tightness   Goal status: INITIAL     PLAN:  PT FREQUENCY: 1x/week  PT DURATION: other: 5 weeks - due to delay in scheduling, anticipate just 4 weeks   PLANNED INTERVENTIONS: [id number]- PT Re-evaluation, 97110-Therapeutic exercises, 97530- Therapeutic activity, 97112- Neuromuscular re-education, 97535- Self Care, [id number]- Manual therapy, 20560 (1-2 muscles), 20561 (3+ muscles)- Dry Needling, Patient/Family education, Joint mobilization, Spinal mobilization, Cryotherapy, and Moist heat  PLAN FOR NEXT SESSION: with [place] - try dry needling With Carmyn Hamm - add stretches for neck, postural strengthening and stability, how did initial HEP go and how was [employer]?    Sheffield LOISE Senate, PT, DPT 11/16/2023, 3:35 PM

## 2023-11-21 ENCOUNTER — Institutional Professional Consult (permissible substitution): Admitting: Neurology

## 2023-11-21 ENCOUNTER — Encounter: Payer: Self-pay | Admitting: Neurology

## 2023-11-27 ENCOUNTER — Ambulatory Visit: Admitting: Physical Therapy

## 2023-11-27 ENCOUNTER — Encounter: Payer: Self-pay | Admitting: Physical Therapy

## 2023-11-27 DIAGNOSIS — G44221 Chronic tension-type headache, intractable: Secondary | ICD-10-CM | POA: Diagnosis not present

## 2023-11-27 DIAGNOSIS — R293 Abnormal posture: Secondary | ICD-10-CM | POA: Diagnosis not present

## 2023-11-27 DIAGNOSIS — M542 Cervicalgia: Secondary | ICD-10-CM | POA: Diagnosis not present

## 2023-11-27 NOTE — Therapy (Signed)
 OUTPATIENT PHYSICAL THERAPY CERVICAL TREATMENT   Patient Name: Carlos Tapia MRN: [id number] DOB:[birth year], 53 y.o., male Today's Date: 11/27/2023  END OF SESSION:  PT End of Session - 11/27/23 1400     Visit Number 2    Number of Visits 5    Date for PT Re-Evaluation 12/21/23    Authorization Type [employer]    PT Start Time 1400    PT Stop Time 1438    PT Time Calculation (min) 38 min    Activity Tolerance Patient tolerated treatment well    Behavior During Therapy WFL for tasks assessed/performed          Past Medical History:  Diagnosis Date   Adopted    per unsure of family medial history   Anxiety    Arthritis    Bipolar 1 disorder (HCC)    Coronary artery calcification    evaluted by cardiology-- dr maxwell   Depression    Hemorrhoids 2018   history gi bleed s/p colonoscopy   History of asthma    per pt no longer issue  has not uses inhaler for yrs since wt loss   Hypogonadism male    Lower urinary tract symptoms (LUTS)    Migraines    Mild tetrahydrocannabinol (THC) abuse    Phimosis    Prostatitis    Trauma in childhood    Vitamin D  deficiency    Past Surgical History:  Procedure Laterality Date   CIRCUMCISION N/A 07/11/2022   Procedure: CIRCUMCISION ADULT;  Surgeon: Roseann Adine PARAS., MD;  Location: [facility];  Service: Urology;  Laterality: N/A;   COLONOSCOPY WITH PROPOFOL   01/05/2017   dr nandigam   ROBOT ASSISTED INGUINAL HERNIA REPAIR Left 05/08/2022   @ [facility]  by dr c. signe   SHOULDER ARTHROSCOPY W/ ROTATOR CUFF REPAIR Right 06/2020   Patient Active Problem List   Diagnosis Date Noted   Thyroid  nodule 10/13/2023   Body mass index (BMI) of 40.0-44.9 in adult [facility]) 09/15/2023   Intractable chronic cluster headache 09/15/2023   Lightheadedness 09/15/2023   Vestibular migraine 09/15/2023   GAD (generalized anxiety disorder) 09/15/2023   Frequent headaches 09/12/2022   Tinnitus aurium, bilateral 09/07/2022    Prostatitis 06/15/2022   Balanitis 06/09/2022   Phimosis 06/09/2022   Dysuria 06/09/2022   Non-recurrent unilateral inguinal hernia without obstruction or gangrene 04/20/2022   Coronary atherosclerosis 04/20/2022   Syncope and collapse 04/17/2022   Proctalgia fugax 04/17/2022   Abdominal pain 04/17/2022   Hyperglycemia 10/03/2021   HLD (hyperlipidemia) 10/03/2021   Low testosterone  in male 10/03/2021   Encounter for well adult exam with abnormal findings 10/03/2021   Anxiety and depression 10/03/2021   Sleep apnea in adult 04/08/2020   GERD (gastroesophageal reflux disease) 04/08/2020   Chronic right shoulder pain 04/08/2020   Left lumbar radiculopathy 06/10/2019   Ulnar neuritis, left 03/06/2019   Allergic rhinitis 01/21/2019   Asthma 01/21/2019   Rupture of plantaris tendon, left, initial encounter 03/15/2017   Hypersomnolence 11/28/2016   Hematochezia 11/28/2016   Morbid obesity (HCC) 09/06/2015   Acute sinusitis 05/19/2015   Insomnia 08/19/2014   Fatigue 08/19/2014   Blood pressure elevated without history of HTN 08/19/2014    PCP:  Norleen Lynwood ORN, MD   REFERRING PROVIDER: Gregg Lek, MD  REFERRING DIAG: M54.2 (ICD-10-CM) - Cervicalgia [phone] (ICD-10-CM) - Chronic tension-type headache, intractable  THERAPY DIAG:  Cervicalgia  Abnormal posture  Rationale for Evaluation and Treatment: Rehabilitation  ONSET DATE: 10/31/2023  SUBJECTIVE:                                                                                                                                                                                                         SUBJECTIVE STATEMENT: Feels his migraines are messing with him today and head is feeling a little fuzzy. Asking for a new copy of his exercises for home. Has been trying the chirp wheel at home. Pt reporting 8/10 headache when he came in today.   Hand dominance: Right  PERTINENT HISTORY:  PMH: Bipolar disorder,  anxiety/Depression, migraines   Per neurologist: Since February he has been complaining of headaches, reports that the headaches are currently constant, he feels a pressure-like pain in the front between his eyes and on the temple and sharp aching pain in the occipital region. He initially thought he had a sinus infection, follow-up with ENT have sinus CTs and was told everything was normal   PAIN:  Are you having pain? No, just having some tightness in the back of his head   There were no vitals filed for this visit.    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 Buck on 11/21/23  OBJECTIVE:  Note: Objective measures were completed at Evaluation unless otherwise noted.  DIAGNOSTIC FINDINGS:  MRI cervical spine 10/24/23:  MRI scan cervical spine without contrast showing mild disc and facet degenerative changes throughout most prominent at C4-5 where there is focal central disc protrusion with no significant compression.   PATIENT SURVEYS:  Headache Disability Index: 58/100 = Severe disability in regards to headaches    COGNITION: Overall cognitive status: Within functional limits for tasks assessed  SENSATION: Pt denies numbness/tingling  POSTURE: rounded shoulders and forward head  PALPATION: Incr TTP R >L suboccipitals, R cervical paraspinals, R upper trap and levator scap    CERVICAL ROM:   Active ROM A/PROM (deg) eval  Flexion 40, feels tightness  Extension 45, gets pressure in back of head  Right lateral flexion 40  Left lateral flexion 43, feels incr tightness in R side of neck  Right rotation 65  Left rotation 60, feels tighter going to this side   (Blank rows = not tested)   UPPER EXTREMITY AROM  AROM Right eval Left eval  Shoulder flexion WNL WNL  Shoulder extension    Shoulder  abduction WNL WNL  Shoulder adduction  Shoulder extension    Shoulder internal rotation WNL WNL  Shoulder external rotation WNL WNL  Middle trapezius    Lower trapezius    Elbow flexion    Elbow extension    Wrist flexion    Wrist extension    Wrist ulnar deviation    Wrist radial deviation    Wrist pronation    Wrist supination    Grip strength     (Blank rows = not tested)  Pt reports feeling more tightness on R side    TREATMENT DATE: 11/27/23                                                                                                                                Therapeutic Exercise: Seated scap retraction 2 x 10 reps  Supine cervical retraction 2 x 10 reps, pt reporting tightness  Showed pt how to use Chirp Wheel to target more of his suboccipitals as pt ordered one and had mainly used it in his cervical paraspinals, pt reporting feeling relief in his suboccipitals and reporting his headache decr  Reviewed seated upper trap stretch from HEP 30 seconds each side Sidelying open books for thoracic mobility and chest stretch 10 reps each side   Mid back stretch 2 x 30 seconds with arms crossed   Manual Therapy: Suboccipital release 5 x 30-45 seconds each, pt reporting decr in headache Cervical distraction 3 x 30 seconds, pt reporting relief   Therapeutic Activity:  Trigger Point Dry Needling  Initial Treatment: Pt instructed on Dry Needling rational, procedures, and possible side effects. Pt instructed to expect mild to moderate muscle soreness later in the day and/or into the next day.  Pt instructed in methods to reduce muscle soreness. Pt instructed to continue prescribed HEP. Because Dry Needling was performed over or adjacent to a lung field, pt was educated on S/S of pneumothorax and to seek immediate medical attention should they occur.  Patient was educated on signs and symptoms of infection and other risk factors and advised to seek medical attention should  they occur.  Patient verbalized understanding of these instructions and education.   Patient Verbal Consent Given: Yes Education Handout Provided: Yes Muscles Treated: L and R suboccipitals, R UT Electrical Stimulation Performed: No Treatment Response/Outcome: deep ache/pressure  Trigger Point Dry Needling  What is Trigger Point Dry Needling (DN)? DN is a physical therapy technique used to treat muscle pain and dysfunction. Specifically, DN helps deactivate muscle trigger points (muscle knots).  A thin filiform needle is used to penetrate the skin and stimulate the underlying trigger point. The goal is for a local twitch response (LTR) to occur and for the trigger point to relax. No medication of any kind is injected during the procedure.  Benefits of Trigger Point Dry Needling Reduces localized tension and stiffness promoting muscle function. Promotes range of motion and flexibility of the area being treated. Relieves localized and referred muscle related pain.  Promotes localized blood flow. Benefits of DN increase when performed with formal therapy including strengthening and stretching.   What Does Trigger Point Dry Needling Feel Like?  The procedure feels different for each individual patient. Some patients report that they do not actually feel the needle enter the skin and overall the process is not painful. Very mild bleeding may occur. However, many patients feel a deep cramping in the muscle in which the needle was inserted. This is the local twitch response.   How Will I feel after the treatment? Soreness is normal, and the onset of soreness may not occur for a few hours. Typically this soreness does not last longer than two days.  Bruising is uncommon, however; ice can be used to decrease any possible bruising.  In rare cases feeling tired or nauseous after the treatment is normal. In addition, your symptoms may get worse before they get better, this period will typically not  last longer than 24 hours.   What Can I do After My Treatment? Increase your hydration by drinking more water for the next 24 hours.  You may place ice or heat on the areas treated that have become sore, however, do not use heat on inflamed or bruised areas. Heat often brings more relief post needling. You can continue your regular activities, but vigorous activity is not recommended initially after the treatment for 24 hours. DN is best combined with other physical therapy such as strengthening, stretching, and other therapies.   What are the complications? While your therapist has had extensive training in minimizing the risks of trigger point dry needling, it is important to understand the risks of any procedure.  Risks include bleeding, pain, fatigue, hematoma, infection, vertigo, nausea or nerve involvement. Monitor for any changes to your skin or sensation. Contact your therapist or MD with concerns.  A rare but serious complication is a pneumothorax over or near your middle and upper chest and back If you have dry needling in this area, monitor for the following symptoms: Shortness of breath on exertion and/or Difficulty taking a deep breath and/or Chest Pain and/or A dry cough If any of the above symptoms develop, please go to the nearest emergency room or call 911. Tell them you had dry needling over your thorax and report any symptoms you are having. Please follow-up with your treating therapist after you complete the medical evaluation.   PATIENT EDUCATION:  Education details: Dry needling education, additions to HEP, where to place chirp wheel for suboccipital release Person educated: Patient Education method: Explanation, Demonstration, Verbal cues, and Handouts Education comprehension: verbalized understanding and returned demonstration  HOME EXERCISE PROGRAM: Chirp Wheel for suboccipital release   Access Code: [email or web address] URL: [email or web address] Date:  11/27/2023 Prepared by: Sheffield Senate  Exercises - Seated Upper Trapezius Stretch  - 2 x daily - 7 x weekly - 3 sets - 30 hold - Gentle Levator Scapulae Stretch  - 2 x daily - 7 x weekly - 3 sets - 30 hold - Seated Scapular Retraction  - 2 x daily - 7 x weekly - 2 sets - 10 reps - Supine Chin Tuck  - 2 x daily - 7 x weekly - 2 sets - 10 reps - Sidelying Thoracic Rotation with Open Book  - 2 x daily - 7 x weekly - 1 sets - 10 reps  ASSESSMENT:  CLINICAL IMPRESSION: Today's skilled session focused on adding exercises to HEP to address cervical tightness and thoracic mobility. Performed suboccipital release with pt  reporting no headache afterwards. Showed pt how to use his chirp wheel at home to target more suboccipitals which was beneficial as pt had primarily just been using to cervical paraspinals. Dry needling was performed by certified therapist, Waddell, dry needled pt's bilateral suboccipitals and R upper trap due to trigger points with pt able to tolerate well. At start of session, pt reporting headache at an 8/10 and at end of session, rated as a 0/10. Will continue per POC.     OBJECTIVE IMPAIRMENTS: decreased mobility, decreased ROM, hypomobility, increased fascial restrictions, increased muscle spasms, impaired flexibility, and postural dysfunction.   ACTIVITY LIMITATIONS: headaches affect his activity in daily life  PARTICIPATION LIMITATIONS: headaches affect participation in daily life   PERSONAL FACTORS: Behavior pattern, Past/current experiences, Time since onset of injury/illness/exacerbation, and 3+ comorbidities:  Bipolar disorder, anxiety/Depression, migraines  are also affecting patient's functional outcome.   REHAB POTENTIAL: Good  CLINICAL DECISION MAKING: Stable/uncomplicated  EVALUATION COMPLEXITY: Low   GOALS: Goals reviewed with patient? Yes  SHORT TERM GOALS: ALL STGS = LTGS  LONG TERM GOALS: Target date: 12/21/2023   Pt will be independent with final  HEP for neck ROM and posture strengthening to help with headches. Baseline:  Goal status: INITIAL  2.  Pt will decr Headache disability index to 29 or less in order to demo decr disability with headaches.  Baseline: 58 = severe disability  Goal status: INITIAL  3.  Pt will improve cervical rotation AROM to at lest 70 degrees bilat to demo improved AROM for daily life.  Baseline:  Right rotation 65  Left rotation 60, feels tighter going to this side   Goal status: INITIAL  4.  Pt will improve cervical flexion AROM to at least 60 deg to demo improved mobility  Baseline:  Flexion 40, feels tightness   Goal status: INITIAL     PLAN:  PT FREQUENCY: 1x/week  PT DURATION: other: 5 weeks - due to delay in scheduling, anticipate just 4 weeks   PLANNED INTERVENTIONS: 97164- PT Re-evaluation, 97110-Therapeutic exercises, 97530- Therapeutic activity, 97112- Neuromuscular re-education, 97535- Self Care, [id number]- Manual therapy, 20560 (1-2 muscles), 20561 (3+ muscles)- Dry Needling, Patient/Family education, Joint mobilization, Spinal mobilization, Cryotherapy, and Moist heat  PLAN FOR NEXT SESSION: how was dry needling? Try again if needed  Continue to work on neck stretches, postural strengthening and stability, thoracic mobility    Sheffield LOISE Senate, PT, DPT Taylor [place], PT, DPT, CSRS  11/27/2023, 2:42 PM

## 2023-11-30 ENCOUNTER — Other Ambulatory Visit (HOSPITAL_COMMUNITY): Payer: Self-pay | Admitting: Psychiatry

## 2023-11-30 DIAGNOSIS — Z62819 Personal history of unspecified abuse in childhood: Secondary | ICD-10-CM

## 2023-11-30 DIAGNOSIS — F319 Bipolar disorder, unspecified: Secondary | ICD-10-CM

## 2023-11-30 DIAGNOSIS — F5101 Primary insomnia: Secondary | ICD-10-CM

## 2023-12-05 ENCOUNTER — Ambulatory Visit: Admitting: Physical Therapy

## 2023-12-05 NOTE — Therapy (Incomplete)
 OUTPATIENT PHYSICAL THERAPY CERVICAL TREATMENT   Patient Name: Carlos Tapia MRN: [id number] DOB:1970/12/25, 53 y.o., male Today's Date: 12/05/2023  END OF SESSION:    Past Medical History:  Diagnosis Date   Adopted    per unsure of family medial history   Anxiety    Arthritis    Bipolar 1 disorder (HCC)    Coronary artery calcification    evaluted by cardiology-- dr maxwell   Depression    Hemorrhoids 2018   history gi bleed s/p colonoscopy   History of asthma    per pt no longer issue  has not uses inhaler for yrs since wt loss   Hypogonadism male    Lower urinary tract symptoms (LUTS)    Migraines    Mild tetrahydrocannabinol (THC) abuse    Phimosis    Prostatitis    Trauma in childhood    Vitamin D  deficiency    Past Surgical History:  Procedure Laterality Date   CIRCUMCISION N/A 07/11/2022   Procedure: CIRCUMCISION ADULT;  Surgeon: Roseann Adine PARAS., MD;  Location: [facility];  Service: Urology;  Laterality: N/A;   COLONOSCOPY WITH PROPOFOL   01/05/2017   dr nandigam   ROBOT ASSISTED INGUINAL HERNIA REPAIR Left 05/08/2022   @ [facility]  by dr c. signe   SHOULDER ARTHROSCOPY W/ ROTATOR CUFF REPAIR Right 06/2020   Patient Active Problem List   Diagnosis Date Noted   Thyroid  nodule 10/13/2023   Body mass index (BMI) of 40.0-44.9 in adult [facility]) 09/15/2023   Intractable chronic cluster headache 09/15/2023   Lightheadedness 09/15/2023   Vestibular migraine 09/15/2023   GAD (generalized anxiety disorder) 09/15/2023   Frequent headaches 09/12/2022   Tinnitus aurium, bilateral 09/07/2022   Prostatitis 06/15/2022   Balanitis 06/09/2022   Phimosis 06/09/2022   Dysuria 06/09/2022   Non-recurrent unilateral inguinal hernia without obstruction or gangrene 04/20/2022   Coronary atherosclerosis 04/20/2022   Syncope and collapse 04/17/2022   Proctalgia fugax 04/17/2022   Abdominal pain 04/17/2022   Hyperglycemia 10/03/2021   HLD (hyperlipidemia) 10/03/2021    Low testosterone  in male 10/03/2021   Encounter for well adult exam with abnormal findings 10/03/2021   Anxiety and depression 10/03/2021   Sleep apnea in adult 04/08/2020   GERD (gastroesophageal reflux disease) 04/08/2020   Chronic right shoulder pain 04/08/2020   Left lumbar radiculopathy 06/10/2019   Ulnar neuritis, left 03/06/2019   Allergic rhinitis 01/21/2019   Asthma 01/21/2019   Rupture of plantaris tendon, left, initial encounter 03/15/2017   Hypersomnolence 11/28/2016   Hematochezia 11/28/2016   Morbid obesity (HCC) 09/06/2015   Acute sinusitis 05/19/2015   Insomnia 08/19/2014   Fatigue 08/19/2014   Blood pressure elevated without history of HTN 08/19/2014    PCP:  Norleen Lynwood ORN, MD   REFERRING PROVIDER: Gregg Lek, MD  REFERRING DIAG: M54.2 (ICD-10-CM) - Cervicalgia [phone] (ICD-10-CM) - Chronic tension-type headache, intractable  THERAPY DIAG:  No diagnosis found.  Rationale for Evaluation and Treatment: Rehabilitation  ONSET DATE: 10/31/2023  SUBJECTIVE:  SUBJECTIVE STATEMENT: Feels his migraines are messing with him today and head is feeling a little fuzzy. Asking for a new copy of his exercises for home. Has been trying the chirp wheel at home. Pt reporting 8/10 headache when he came in today.   ***  Hand dominance: Right  PERTINENT HISTORY:  PMH: Bipolar disorder, anxiety/Depression, migraines   Per neurologist: Since February he has been complaining of headaches, reports that the headaches are currently constant, he feels a pressure-like pain in the front between his eyes and on the temple and sharp aching pain in the occipital region. He initially thought he had a sinus infection, follow-up with ENT have sinus CTs and was told everything was normal    PAIN:  Are you having pain? No, just having some tightness in the back of his head   There were no vitals filed for this visit.    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 Buck on 11/21/23  OBJECTIVE:  Note: Objective measures were completed at Evaluation unless otherwise noted.  DIAGNOSTIC FINDINGS:  MRI cervical spine 10/24/23:  MRI scan cervical spine without contrast showing mild disc and facet degenerative changes throughout most prominent at C4-5 where there is focal central disc protrusion with no significant compression.   PATIENT SURVEYS:  Headache Disability Index: 58/100 = Severe disability in regards to headaches    COGNITION: Overall cognitive status: Within functional limits for tasks assessed  SENSATION: Pt denies numbness/tingling  POSTURE: rounded shoulders and forward head  PALPATION: Incr TTP R >L suboccipitals, R cervical paraspinals, R upper trap and levator scap    CERVICAL ROM:   Active ROM A/PROM (deg) eval  Flexion 40, feels tightness  Extension 45, gets pressure in back of head  Right lateral flexion 40  Left lateral flexion 43, feels incr tightness in R side of neck  Right rotation 65  Left rotation 60, feels tighter going to this side   (Blank rows = not tested)   UPPER EXTREMITY AROM  AROM Right eval Left eval  Shoulder flexion WNL WNL  Shoulder extension    Shoulder abduction WNL WNL  Shoulder adduction    Shoulder extension    Shoulder internal rotation WNL WNL  Shoulder external rotation WNL WNL  Middle trapezius    Lower trapezius    Elbow flexion    Elbow extension    Wrist flexion    Wrist extension    Wrist ulnar deviation    Wrist radial deviation    Wrist pronation    Wrist supination    Grip strength     (Blank rows = not tested)  Pt  reports feeling more tightness on R side    TREATMENT DATE:  Therapeutic Exercise: Seated scap retraction 2 x 10 reps  Supine cervical retraction 2 x 10 reps, pt reporting tightness  Showed pt how to use Chirp Wheel to target more of his suboccipitals as pt ordered one and had mainly used it in his cervical paraspinals, pt reporting feeling relief in his suboccipitals and reporting his headache decr  Reviewed seated upper trap stretch from HEP 30 seconds each side Sidelying open books for thoracic mobility and chest stretch 10 reps each side   Mid back stretch 2 x 30 seconds with arms crossed   Manual Therapy: Suboccipital release 5 x 30-45 seconds each, pt reporting decr in headache Cervical distraction 3 x 30 seconds, pt reporting relief   Therapeutic Activity:  Trigger Point Dry Needling  Initial Treatment: Pt instructed on Dry Needling rational, procedures, and possible side effects. Pt instructed to expect mild to moderate muscle soreness later in the day and/or into the next day.  Pt instructed in methods to reduce muscle soreness. Pt instructed to continue prescribed HEP. Because Dry Needling was performed over or adjacent to a lung field, pt was educated on S/S of pneumothorax and to seek immediate medical attention should they occur.  Patient was educated on signs and symptoms of infection and other risk factors and advised to seek medical attention should they occur.  Patient verbalized understanding of these instructions and education.   Patient Verbal Consent Given: Yes Education Handout Provided: Yes Muscles Treated: L and R suboccipitals, R UT*** Electrical Stimulation Performed: No Treatment Response/Outcome: deep ache/pressure     PATIENT EDUCATION:  Education details: Dry needling education, additions to HEP, where to place chirp wheel  for suboccipital release*** Person educated: Patient Education method: Explanation, Demonstration, Verbal cues, and Handouts Education comprehension: verbalized understanding and returned demonstration  HOME EXERCISE PROGRAM: Chirp Wheel for suboccipital release   Access Code: [email or web address] URL: [email or web address] Date: 11/27/2023 Prepared by: Sheffield Senate  Exercises - Seated Upper Trapezius Stretch  - 2 x daily - 7 x weekly - 3 sets - 30 hold - Gentle Levator Scapulae Stretch  - 2 x daily - 7 x weekly - 3 sets - 30 hold - Seated Scapular Retraction  - 2 x daily - 7 x weekly - 2 sets - 10 reps - Supine Chin Tuck  - 2 x daily - 7 x weekly - 2 sets - 10 reps - Sidelying Thoracic Rotation with Open Book  - 2 x daily - 7 x weekly - 1 sets - 10 reps  ASSESSMENT:  CLINICAL IMPRESSION: Today's skilled session focused on adding exercises to HEP to address cervical tightness and thoracic mobility. Performed suboccipital release with pt reporting no headache afterwards. Showed pt how to use his chirp wheel at home to target more suboccipitals which was beneficial as pt had primarily just been using to cervical paraspinals. Dry needling was performed by certified therapist, Waddell, dry needled pt's bilateral suboccipitals and R upper trap due to trigger points with pt able to tolerate well. At start of session, pt reporting headache at an 8/10 and at end of session, rated as a 0/10. Will continue per POC.   Emphasis of skilled PT session*** Continue POC.     OBJECTIVE IMPAIRMENTS: decreased mobility, decreased ROM, hypomobility, increased fascial restrictions, increased muscle spasms, impaired flexibility, and postural dysfunction.   ACTIVITY LIMITATIONS: headaches affect his activity in daily life  PARTICIPATION LIMITATIONS: headaches affect participation in daily life   PERSONAL FACTORS: Behavior pattern, Past/current experiences, Time since onset of  injury/illness/exacerbation, and 3+ comorbidities:  Bipolar disorder, anxiety/Depression, migraines  are also affecting patient's functional outcome.   REHAB POTENTIAL: Good  CLINICAL DECISION MAKING: Stable/uncomplicated  EVALUATION COMPLEXITY: Low   GOALS: Goals reviewed with patient? Yes  SHORT TERM GOALS: ALL STGS = LTGS  LONG TERM GOALS: Target date: 12/21/2023   Pt will be independent with final HEP for neck ROM and posture strengthening to help with headches. Baseline:  Goal status: INITIAL  2.  Pt will decr Headache disability index to 29 or less in order to demo decr disability with headaches.  Baseline: 58 = severe disability  Goal status: INITIAL  3.  Pt will improve cervical rotation AROM to at lest 70 degrees bilat to demo improved AROM for daily life.  Baseline:  Right rotation 65  Left rotation 60, feels tighter going to this side   Goal status: INITIAL  4.  Pt will improve cervical flexion AROM to at least 60 deg to demo improved mobility  Baseline:  Flexion 40, feels tightness   Goal status: INITIAL     PLAN:  PT FREQUENCY: 1x/week  PT DURATION: other: 5 weeks - due to delay in scheduling, anticipate just 4 weeks   PLANNED INTERVENTIONS: 97164- PT Re-evaluation, 97110-Therapeutic exercises, 97530- Therapeutic activity, 97112- Neuromuscular re-education, 97535- Self Care, 02859- Manual therapy, 20560 (1-2 muscles), 20561 (3+ muscles)- Dry Needling, Patient/Family education, Joint mobilization, Spinal mobilization, Cryotherapy, and Moist heat  PLAN FOR NEXT SESSION: how was dry needling? Try again if needed  Continue to work on neck stretches, postural strengthening and stability, thoracic mobility ***   Waddell Southgate, PT Waddell Southgate, PT, DPT, CSRS  12/05/2023, 7:55 AM

## 2023-12-06 ENCOUNTER — Telehealth (HOSPITAL_BASED_OUTPATIENT_CLINIC_OR_DEPARTMENT_OTHER): Admitting: Psychiatry

## 2023-12-06 ENCOUNTER — Encounter (HOSPITAL_COMMUNITY): Payer: Self-pay | Admitting: Psychiatry

## 2023-12-06 DIAGNOSIS — F411 Generalized anxiety disorder: Secondary | ICD-10-CM | POA: Diagnosis not present

## 2023-12-06 DIAGNOSIS — F5101 Primary insomnia: Secondary | ICD-10-CM | POA: Diagnosis not present

## 2023-12-06 DIAGNOSIS — Z62819 Personal history of unspecified abuse in childhood: Secondary | ICD-10-CM | POA: Diagnosis not present

## 2023-12-06 DIAGNOSIS — F319 Bipolar disorder, unspecified: Secondary | ICD-10-CM | POA: Diagnosis not present

## 2023-12-06 DIAGNOSIS — F4010 Social phobia, unspecified: Secondary | ICD-10-CM

## 2023-12-06 MED ORDER — ARIPIPRAZOLE 5 MG PO TABS: 5.0000 mg | ORAL_TABLET | Freq: Every day | ORAL | 2 refills | Status: DC

## 2023-12-06 MED ORDER — PROPRANOLOL HCL 20 MG PO TABS: 20.0000 mg | ORAL_TABLET | Freq: Two times a day (BID) | ORAL | 2 refills | Status: AC

## 2023-12-06 NOTE — Progress Notes (Signed)
 [facility] MD Virtual Progress Note   Patient Location: Home Provider Location: Office  I connect with patient by video and verified that I am speaking with correct person by using two identifiers. I discussed the limitations of evaluation and management by telemedicine and the availability of in person appointments. I also discussed with the patient that there may be a patient responsible charge related to this service. The patient expressed understanding and agreed to proceed.  Carlos Tapia [id number] 53 y.o.  12/06/2023 2:35 PM  History of Present Illness:  Patient is evaluated by video session.  He reported things are much better and he is going out more frequently than before.  He is also sleeping better.  He denies any mania, psychosis, irritability or any crying spells.  He liked the Inderal  which he takes twice a day.  He is no longer taking gabapentin  because it was making him tired.  He denies any major panic attack.  He used to take Ativan  but it was discontinued after we started him on propranolol .  He denies any hallucination, paranoia, suicidal thoughts.  He admitted weight gain because he stopped walking and admitted eating more than usual and not doing exercise.  He was doing 10,000 steps every day but he stopped it.  He like to go back to keep his weight under control.  He is sleeping better.  He started doing physical therapy and that is helping his neck pain.  He still have headaches but they are less intense from the past.  He admitted not smoking marijuana since the last visit.  His nightmares and flashbacks are not as intense.  He has no tremor or shakes or any EPS.  Past Psychiatric History: History of difficult childhood.  Involvement in gang crime and served jail time for 3 years and 3 years probation.  Never seen psychiatrist, counseling or any psychotropic medication.  No history of suicidal attempt.  History of mood swing, anger issues.  History of crack  cocaine, heroin, drugs but claimed to be sober for more than 20 years.  Smoked marijuana on a daily basis.  No history of DUI..  Hydroxyzine  10 mg but unclear if that cause insomnia.   Past Medical History:  Diagnosis Date   Adopted    per unsure of family medial history   Anxiety    Arthritis    Bipolar 1 disorder (HCC)    Coronary artery calcification    evaluted by cardiology-- dr maxwell   Depression    Hemorrhoids 2018   history gi bleed s/p colonoscopy   History of asthma    per pt no longer issue  has not uses inhaler for yrs since wt loss   Hypogonadism male    Lower urinary tract symptoms (LUTS)    Migraines    Mild tetrahydrocannabinol (THC) abuse    Phimosis    Prostatitis    Trauma in childhood    Vitamin D  deficiency     Outpatient Encounter Medications as of 12/06/2023  Medication Sig   ARIPiprazole  (ABILIFY ) 5 MG tablet Take 1 tablet (5 mg total) by mouth daily.   Cholecalciferol (VITAMIN D -3) 125 MCG (5000 UT) TABS Take 5,000 Units by mouth daily.   gabapentin  (NEURONTIN ) 100 MG capsule Take 1 capsule (100 mg total) by mouth 2 (two) times daily. (Patient not taking: Reported on 11/16/2023)   Galcanezumab -gnlm (EMGALITY ) 120 MG/ML SOAJ Inject 120 mg into the skin every 28 (twenty-eight) days.   LORazepam  (ATIVAN ) 0.5 MG tablet  Take 1 tablet (0.5 mg total) by mouth 2 (two) times daily as needed for anxiety. (Patient not taking: Reported on 11/16/2023)   Multiple Vitamins-Minerals (MULTIVITAMIN WITH MINERALS) tablet Take 1 tablet by mouth daily.   propranolol  (INDERAL ) 20 MG tablet Take 1 tablet (20 mg total) by mouth 2 (two) times daily.   SUMAtriptan  (IMITREX ) 50 MG tablet TAKE ONE TABLET BY MOUTH AT ONSET OF MIGRAINE. IF SYMPTOMS PERSIST, A SECOND DOSE MAY BE TAKEN IN 2 HOURS.   topiramate  (TOPAMAX ) 25 MG tablet Take 1 tablet (25 mg total) by mouth 2 (two) times daily.   No facility-administered encounter medications on file as of 12/06/2023.    Recent Results (from  the past 2160 hours)  CBC w/Diff     Status: None   Collection Time: 09/14/23  2:30 PM  Result Value Ref Range   WBC 8.1 4.0 - 10.5 K/uL   RBC 5.32 4.22 - 5.81 Mil/uL   Hemoglobin 16.2 13.0 - 17.0 g/dL   HCT 51.4 60.9 - 47.9 %   MCV 91.2 78.0 - 100.0 fl   MCHC 33.4 30.0 - 36.0 g/dL   RDW 86.4 88.4 - 84.4 %   Platelets 245.0 150.0 - 400.0 K/uL   Neutrophils Relative % 61.0 43.0 - 77.0 %   Lymphocytes Relative 24.6 12.0 - 46.0 %   Monocytes Relative 9.3 3.0 - 12.0 %   Eosinophils Relative 4.5 0.0 - 5.0 %   Basophils Relative 0.6 0.0 - 3.0 %   Neutro Abs 4.9 1.4 - 7.7 K/uL   Lymphs Abs 2.0 0.7 - 4.0 K/uL   Monocytes Absolute 0.8 0.1 - 1.0 K/uL   Eosinophils Absolute 0.4 0.0 - 0.7 K/uL   Basophils Absolute 0.0 0.0 - 0.1 K/uL  Comp Met (CMET)     Status: None   Collection Time: 09/14/23  2:30 PM  Result Value Ref Range   Sodium 138 135 - 145 mEq/L   Potassium 4.1 3.5 - 5.1 mEq/L   Chloride 106 96 - 112 mEq/L   CO2 25 19 - 32 mEq/L   Glucose, Bld 89 70 - 99 mg/dL   BUN 17 6 - 23 mg/dL   Creatinine, Ser 9.08 0.40 - 1.50 mg/dL   Total Bilirubin 0.3 0.2 - 1.2 mg/dL   Alkaline Phosphatase 65 39 - 117 U/L   AST 20 0 - 37 U/L   ALT 24 0 - 53 U/L   Total Protein 7.2 6.0 - 8.3 g/dL   Albumin 4.6 3.5 - 5.2 g/dL   GFR 02.07 >39.99 mL/min    Comment: Calculated using the CKD-EPI Creatinine Equation (2021)   Calcium 9.4 8.4 - 10.5 mg/dL  CBC with Differential     Status: None   Collection Time: 09/26/23  8:02 PM  Result Value Ref Range   WBC 9.0 4.0 - 10.5 K/uL   RBC 5.32 4.22 - 5.81 MIL/uL   Hemoglobin 16.1 13.0 - 17.0 g/dL   HCT 51.9 60.9 - 47.9 %   MCV 90.2 80.0 - 100.0 fL   MCH 30.3 26.0 - 34.0 pg   MCHC 33.5 30.0 - 36.0 g/dL   RDW 86.9 88.4 - 84.4 %   Platelets 285 150 - 400 K/uL   nRBC 0.0 0.0 - 0.2 %   Neutrophils Relative % 58 %   Neutro Abs 5.3 1.7 - 7.7 K/uL   Lymphocytes Relative 26 %   Lymphs Abs 2.3 0.7 - 4.0 K/uL   Monocytes Relative 10 %  Monocytes Absolute  0.9 0.1 - 1.0 K/uL   Eosinophils Relative 4 %   Eosinophils Absolute 0.4 0.0 - 0.5 K/uL   Basophils Relative 1 %   Basophils Absolute 0.1 0.0 - 0.1 K/uL   Immature Granulocytes 1 %   Abs Immature Granulocytes 0.05 0.00 - 0.07 K/uL    Comment: Performed at [facility], [address] [place], [place] [address]  Basic metabolic panel     Status: None   Collection Time: 09/26/23  8:02 PM  Result Value Ref Range   Sodium 138 135 - 145 mmol/L   Potassium 4.5 3.5 - 5.1 mmol/L    Comment: HEMOLYSIS AT THIS LEVEL MAY AFFECT RESULT   Chloride 106 98 - 111 mmol/L   CO2 23 22 - 32 mmol/L   Glucose, Bld 90 70 - 99 mg/dL    Comment: Glucose reference range applies only to samples taken after fasting for at least 8 hours.   BUN 14 6 - 20 mg/dL   Creatinine, Ser 9.16 0.61 - 1.24 mg/dL   Calcium 9.3 8.9 - 89.6 mg/dL   GFR, Estimated >39 >39 mL/min    Comment: (NOTE) Calculated using the CKD-EPI Creatinine Equation (2021)    Anion gap 9 5 - 15    Comment: Performed at [facility], [address] [place], [place] [address]     Psychiatric Specialty Exam: Physical Exam  Review of Systems  Neurological:  Positive for headaches.    Weight 300 lb (136.1 kg).There is no height or weight on file to calculate BMI.  General Appearance: Casual  Eye Contact:  Good  Speech:  Normal Rate  Volume:  Normal  Mood:  Euthymic  Affect:  Appropriate  Thought Process:  Goal Directed  Orientation:  Full (Time, Place, and Person)  Thought Content:  Logical  Suicidal Thoughts:  No  Homicidal Thoughts:  No  Memory:  Immediate;   Good Recent;   Good Remote;   Fair  Judgement:  Intact  Insight:  Present  Psychomotor Activity:  Normal  Concentration:  Concentration: Good and Attention Span: Good  Recall:  Good  Fund of Knowledge:  Good  Language:  Good  Akathisia:  No  Handed:  Right  AIMS (if indicated):     Assets:  Communication Skills Desire for  Improvement Housing Social Support Talents/Skills Transportation  ADL's:  Intact  Cognition:  WNL  Sleep:  better       09/14/2023    1:39 PM 05/15/2022    3:26 PM 04/20/2022    1:34 PM 10/03/2021    1:46 PM 10/03/2021    1:29 PM  Depression screen PHQ 2/9  Decreased Interest 0 0 0 0 0  Down, Depressed, Hopeless 0 0 0 1 3  PHQ - 2 Score 0 0 0 1 3  Altered sleeping 0 3   3  Tired, decreased energy 2 3   3   Change in appetite 0 0   0  Feeling bad or failure about yourself  2 1   3   Trouble concentrating 0 0   3  Moving slowly or fidgety/restless 0 0   3  Suicidal thoughts 0 0   0  PHQ-9 Score 4 7   18   Difficult doing work/chores Not difficult at all Not difficult at all   Very difficult    Assessment/Plan: Bipolar I disorder (HCC) - Plan: ARIPiprazole  (ABILIFY ) 5 MG tablet  Trauma in childhood - Plan: ARIPiprazole  (ABILIFY ) 5 MG tablet  Primary insomnia - Plan: ARIPiprazole  (ABILIFY ) 5 MG tablet  GAD (generalized anxiety disorder) - Plan: propranolol  (INDERAL ) 20 MG tablet  Social anxiety disorder - Plan: propranolol  (INDERAL ) 20 MG tablet  Patient doing better with the Inderal .  He is no longer taking Ativan  and gabapentin .  He liked Abilify  which is keeping his mood stable.  He started going outside and had a better mood lately.  His headaches are also better and his neck pain is getting better since started physical therapy.  He feels proud that he is not smoking marijuana since the last visit.  Patient has not establish care with a therapist but like to do in the future to help with his trauma in his previous life.  Continue Abilify  5 mg daily and Inderal  20 mg twice a day.  Recommend to call back if is any question or any concern.  Follow-up in 3 months.   Follow Up Instructions:     I discussed the assessment and treatment plan with the patient. The patient was provided an opportunity to ask questions and all were answered. The patient agreed with the plan and  demonstrated an understanding of the instructions.   The patient was advised to call back or seek an in-person evaluation if the symptoms worsen or if the condition fails to improve as anticipated.    Collaboration of Care: Other provider involved in patient's care AEB notes are available in epic to review  Patient/Guardian was advised Release of Information must be obtained prior to any record release in order to collaborate their care with an outside provider. Patient/Guardian was advised if they have not already done so to contact the registration department to sign all necessary forms in order for us  to release information regarding their care.   Consent: Patient/Guardian gives verbal consent for treatment and assignment of benefits for services provided during this visit. Patient/Guardian expressed understanding and agreed to proceed.     Total encounter time 17 minutes which includes face-to-face time, chart reviewed, care coordination, order entry and documentation during this encounter.   Note: This document was prepared by [employer] voice dictation technology and any errors that results from this process are unintentional.    Leni ONEIDA Client, MD 12/06/2023

## 2023-12-11 ENCOUNTER — Telehealth: Payer: Self-pay | Admitting: Physical Therapy

## 2023-12-11 ENCOUNTER — Ambulatory Visit: Attending: Neurology | Admitting: Physical Therapy

## 2023-12-11 NOTE — Telephone Encounter (Signed)
 Attempted to call pt regarding no show appt, but pt did not have a voicemail set up to leave message.  Sheffield Senate, PT, DPT 12/11/23 2:27 PM    [facility] [address] [place], [place]  [address] Phone:  [phone] Fax:  [phone]

## 2023-12-17 DIAGNOSIS — Z79890 Hormone replacement therapy (postmenopausal): Secondary | ICD-10-CM | POA: Diagnosis not present

## 2023-12-17 DIAGNOSIS — R5383 Other fatigue: Secondary | ICD-10-CM | POA: Diagnosis not present

## 2023-12-17 DIAGNOSIS — E291 Testicular hypofunction: Secondary | ICD-10-CM | POA: Diagnosis not present

## 2023-12-19 ENCOUNTER — Ambulatory Visit: Admitting: Physical Therapy

## 2023-12-19 DIAGNOSIS — R5383 Other fatigue: Secondary | ICD-10-CM | POA: Diagnosis not present

## 2023-12-19 DIAGNOSIS — E559 Vitamin D deficiency, unspecified: Secondary | ICD-10-CM | POA: Diagnosis not present

## 2023-12-19 DIAGNOSIS — E291 Testicular hypofunction: Secondary | ICD-10-CM | POA: Diagnosis not present

## 2023-12-19 DIAGNOSIS — Z79890 Hormone replacement therapy (postmenopausal): Secondary | ICD-10-CM | POA: Diagnosis not present

## 2023-12-25 DIAGNOSIS — E291 Testicular hypofunction: Secondary | ICD-10-CM | POA: Diagnosis not present

## 2024-02-01 ENCOUNTER — Ambulatory Visit: Payer: Self-pay

## 2024-02-01 NOTE — Telephone Encounter (Signed)
 FYI Only or Action Required?: Action required by provider: requesting Symbicort .  Patient was last seen in primary care on 10/12/2023 by Norleen Lynwood ORN, MD.  Called Nurse Triage reporting Wheezing.  Symptoms began several weeks ago.  Interventions attempted: Primatene  mist. .  Symptoms are: gradually worsening.  Triage Disposition: See PCP When Office is Open (Within 3 Days)  Patient/caregiver understands and will follow disposition?: Yes       Copied from CRM #[id number]. Topic: Clinical - Red Word Triage >> Feb 01, 2024 12:06 PM Alfonso ORN wrote: Red Word that prompted transfer to Nurse Triage: pt wife calling on behalf of husband has been wheezing  and slight cough for past couple weeks. Reason for Disposition  [1] Also has allergy symptoms (e.g., itchy eyes, clear nasal discharge, postnasal drip) AND [2] they are acting up  Answer Assessment - Initial Assessment Questions Requesting symbicort  for symptoms.Requesting an appointment to be seen to get on a Glp1. Using Primatene  mist.     1. ONSET: When did the cough begin?      A few weeks  2. SEVERITY: How bad is the cough today?      Mild  3. SPUTUM: Describe the color of your sputum (e.g., none, dry cough; clear, white, yellow, green)     Denies  4. HEMOPTYSIS: Are you coughing up any blood? If Yes, ask: How much? (e.g., flecks, streaks, tablespoons, etc.)     Denies  5. DIFFICULTY BREATHING: Are you having difficulty breathing? If Yes, ask: How bad is it? (e.g., mild, moderate, severe)      Denies  6. FEVER: Do you have a fever? If Yes, ask: What is your temperature, how was it measured, and when did it start?     Denies  10. OTHER SYMPTOMS: Do you have any other symptoms? (e.g., runny nose, wheezing, chest pain)       Sinus drip with wheezing  Protocols used: Cough - Acute Non-Productive-A-AH

## 2024-02-04 ENCOUNTER — Encounter: Payer: Self-pay | Admitting: Family Medicine

## 2024-02-04 ENCOUNTER — Ambulatory Visit: Admitting: Family Medicine

## 2024-02-04 VITALS — BP 120/84 | HR 74 | Temp 97.9°F | Ht 70.0 in | Wt 308.6 lb

## 2024-02-04 DIAGNOSIS — Z6841 Body Mass Index (BMI) 40.0 and over, adult: Secondary | ICD-10-CM | POA: Diagnosis not present

## 2024-02-04 DIAGNOSIS — F411 Generalized anxiety disorder: Secondary | ICD-10-CM | POA: Diagnosis not present

## 2024-02-04 DIAGNOSIS — G43809 Other migraine, not intractable, without status migrainosus: Secondary | ICD-10-CM | POA: Diagnosis not present

## 2024-02-04 DIAGNOSIS — J208 Acute bronchitis due to other specified organisms: Secondary | ICD-10-CM

## 2024-02-04 MED ORDER — METHYLPREDNISOLONE ACETATE 80 MG/ML IJ SUSP
80.0000 mg | Freq: Once | INTRAMUSCULAR | Status: AC
  Administered 2024-02-04: 80 mg via INTRAMUSCULAR

## 2024-02-04 MED ORDER — PHENTERMINE HCL 15 MG PO CAPS: 15.0000 mg | ORAL_CAPSULE | ORAL | 1 refills | Status: AC

## 2024-02-04 MED ORDER — AIRSUPRA 90-80 MCG/ACT IN AERO: 2.0000 | INHALATION_SPRAY | RESPIRATORY_TRACT | 3 refills | Status: AC | PRN

## 2024-02-04 NOTE — Patient Instructions (Signed)
 Phentermine  once daily when you get up.  Follow up with me in one month for evaluation of medication effectiveness.  Airsupra to pharmacy, 2 puffs every 4 hours as needed for wheezing.   You have received a steroid injection in the office today.

## 2024-02-04 NOTE — Progress Notes (Unsigned)
 Acute Office Visit  Subjective:     Patient ID: Carlos Tapia, male    DOB: May 30, 1970, 53 y.o.   MRN: [id number]  No chief complaint on file.   HPI  Discussed the use of AI scribe software for clinical note transcription with the patient, who gave verbal consent to proceed.  History of Present Illness Carlos Tapia is a 53 year old male who presents with wheezing and cough.  Respiratory symptoms - Wheezing and dry, tight cough present for 2.5 to 3 weeks - Symbicort  has been effective for symptom control in the past - History of allergies and prior episodes of wheezing - Quit vaping two weeks ago after vaping for eight years - History of tobacco smoking for thirty-five years  Cephalalgia and associated symptoms - Headaches have improved but persist with dizziness and pressure, especially towards the end of Emgality  injection cycle - Uses sumatriptan  in emergencies, taking a quarter dose due to side effects - Takes Topamax  for headache management  Weight fluctuations - Significant weight changes: previously weighed 360 pounds, lost 150 pounds, regained 100 pounds after prostatitis and other health issues - Ongoing difficulty with weight gain despite dieting attempts  Sleep disturbance - Poor sleep quality, obtaining only 3.5 hours of sleep last night due to work schedule - No prior sleep studies performed - Works 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   Physical Exam Vitals and nursing note reviewed.  Constitutional:      General: He is not in acute distress.    Appearance: Normal appearance.  HENT:     Head: Normocephalic and atraumatic.     Right Ear: External ear normal.     Left Ear: External ear normal.     Nose: Nose normal.     Mouth/Throat:     Mouth: Mucous membranes are  moist.     Pharynx: Oropharynx is clear.  Eyes:     Extraocular Movements: Extraocular movements intact.  Cardiovascular:     Rate and Rhythm: Normal rate and regular rhythm.     Pulses: Normal pulses.     Heart sounds: Normal heart sounds.  Pulmonary:     Effort: Pulmonary effort is normal. No respiratory distress.     Breath sounds: Normal breath sounds. No wheezing, rhonchi or rales.  Musculoskeletal:        General: Normal range of motion.     Cervical back: Normal range of motion.     Right lower leg: No edema.     Left lower leg: No edema.  Lymphadenopathy:     Cervical: No cervical adenopathy.  Skin:    General: Skin is warm and dry.  Neurological:     General: No focal deficit present.     Mental Status: He is alert and oriented to person, place, and time.  Psychiatric:        Mood and Affect: Mood normal.        Behavior: Behavior normal.     No results found for any visits on 02/04/24.      Assessment & Plan:   Assessment and Plan Assessment & Plan Wheezing and cough likely related to smoking history Wheezing likely due to smoking history and recent vaping cessation. Symbicort  effective but  not covered; Air Supra considered for better coverage. - Prescribed Air Supra inhaler, 2 puffs every 4 hours as needed. - Provided coupon for [employer] inhaler.  Obesity, BMI 40.0-44.9 Significant weight gain post weight loss. Sleep apnea suspected. Phentermine  considered for weight loss, potential interaction with propranolol  noted. - Prescribed low-dose phentermine . - Instructed to monitor for increased anxiety or heart rate. - Scheduled follow-up in one month to assess phentermine  efficacy.  Chronic cluster headache and vestibular migraine Headaches improved, dizziness and pressure persist. Sumatriptan  used sparingly. Nurtec considered for breakthrough headaches. - Provided samples of Nurtec. - Scheduled follow-up in one month to evaluate Nurtec  efficacy.  Generalized anxiety disorder Anxiety improved with headache management. Phentermine  may increase heart rate; propranolol  may mitigate. - Monitor anxiety levels with phentermine  use. - Adjust propranolol  as needed based on anxiety symptoms.     No orders of the defined types were placed in this encounter.    No orders of the defined types were placed in this encounter.   No follow-ups on file.  Corean LITTIE Ku, FNP

## 2024-04-02 ENCOUNTER — Other Ambulatory Visit: Payer: Self-pay | Admitting: Family Medicine

## 2024-04-02 DIAGNOSIS — F411 Generalized anxiety disorder: Secondary | ICD-10-CM

## 2024-04-02 DIAGNOSIS — G43809 Other migraine, not intractable, without status migrainosus: Secondary | ICD-10-CM

## 2024-04-02 DIAGNOSIS — G44021 Chronic cluster headache, intractable: Secondary | ICD-10-CM

## 2024-04-30 ENCOUNTER — Other Ambulatory Visit (HOSPITAL_COMMUNITY): Payer: Self-pay | Admitting: Psychiatry

## 2024-04-30 DIAGNOSIS — Z62819 Personal history of unspecified abuse in childhood: Secondary | ICD-10-CM

## 2024-04-30 DIAGNOSIS — F319 Bipolar disorder, unspecified: Secondary | ICD-10-CM

## 2024-04-30 DIAGNOSIS — F5101 Primary insomnia: Secondary | ICD-10-CM

## 2024-05-06 ENCOUNTER — Other Ambulatory Visit (HOSPITAL_COMMUNITY): Payer: Self-pay | Admitting: *Deleted

## 2024-05-06 DIAGNOSIS — Z62819 Personal history of unspecified abuse in childhood: Secondary | ICD-10-CM

## 2024-05-06 DIAGNOSIS — F319 Bipolar disorder, unspecified: Secondary | ICD-10-CM

## 2024-05-06 DIAGNOSIS — F5101 Primary insomnia: Secondary | ICD-10-CM

## 2024-05-06 MED ORDER — ARIPIPRAZOLE 5 MG PO TABS: 5.0000 mg | ORAL_TABLET | Freq: Every day | ORAL | 0 refills | Status: AC

## 2024-05-09 ENCOUNTER — Other Ambulatory Visit (HOSPITAL_COMMUNITY): Payer: Self-pay | Admitting: Psychiatry

## 2024-05-09 DIAGNOSIS — F319 Bipolar disorder, unspecified: Secondary | ICD-10-CM

## 2024-05-09 DIAGNOSIS — Z62819 Personal history of unspecified abuse in childhood: Secondary | ICD-10-CM

## 2024-05-09 DIAGNOSIS — F5101 Primary insomnia: Secondary | ICD-10-CM

## 2024-05-12 ENCOUNTER — Telehealth (HOSPITAL_COMMUNITY): Admitting: Psychiatry

## 2024-06-12 ENCOUNTER — Ambulatory Visit: Admitting: Neurology
